# Patient Record
Sex: Female | Born: 1935 | Race: White | Hispanic: No | Smoking: Never smoker
Health system: Southern US, Community
[De-identification: ages and names within clinical notes are randomized; demographics above are authoritative.]

## PROBLEM LIST (undated history)

## (undated) DIAGNOSIS — K559 Vascular disorder of intestine, unspecified: Secondary | ICD-10-CM

## (undated) DIAGNOSIS — M1990 Unspecified osteoarthritis, unspecified site: Secondary | ICD-10-CM

## (undated) DIAGNOSIS — N390 Urinary tract infection, site not specified: Secondary | ICD-10-CM

## (undated) DIAGNOSIS — I499 Cardiac arrhythmia, unspecified: Secondary | ICD-10-CM

## (undated) DIAGNOSIS — I441 Atrioventricular block, second degree: Secondary | ICD-10-CM

## (undated) DIAGNOSIS — I442 Atrioventricular block, complete: Secondary | ICD-10-CM

## (undated) DIAGNOSIS — I4891 Unspecified atrial fibrillation: Secondary | ICD-10-CM

## (undated) DIAGNOSIS — Z950 Presence of cardiac pacemaker: Secondary | ICD-10-CM

## (undated) DIAGNOSIS — I10 Essential (primary) hypertension: Secondary | ICD-10-CM

## (undated) DIAGNOSIS — E785 Hyperlipidemia, unspecified: Secondary | ICD-10-CM

## (undated) DIAGNOSIS — D649 Anemia, unspecified: Secondary | ICD-10-CM

## (undated) DIAGNOSIS — R55 Syncope and collapse: Secondary | ICD-10-CM

## (undated) DIAGNOSIS — D696 Thrombocytopenia, unspecified: Secondary | ICD-10-CM

## (undated) DIAGNOSIS — I471 Supraventricular tachycardia: Secondary | ICD-10-CM

## (undated) DIAGNOSIS — J069 Acute upper respiratory infection, unspecified: Secondary | ICD-10-CM

## (undated) DIAGNOSIS — I639 Cerebral infarction, unspecified: Secondary | ICD-10-CM

## (undated) HISTORY — DX: Acute upper respiratory infection, unspecified: J06.9

## (undated) HISTORY — DX: Unspecified osteoarthritis, unspecified site: M19.90

## (undated) HISTORY — PX: CHOLECYSTECTOMY: SHX55

## (undated) HISTORY — PX: OTHER SURGICAL HISTORY: SHX169

## (undated) HISTORY — DX: Cardiac arrhythmia, unspecified: I49.9

## (undated) HISTORY — DX: Urinary tract infection, site not specified: N39.0

## (undated) HISTORY — DX: Supraventricular tachycardia: I47.1

## (undated) HISTORY — PX: PACEMAKER PLACEMENT: SHX43

## (undated) HISTORY — DX: Atrioventricular block, complete: I44.2

## (undated) HISTORY — DX: Unspecified atrial fibrillation: I48.91

## (undated) HISTORY — DX: Cerebral infarction, unspecified: I63.9

## (undated) HISTORY — DX: Essential (primary) hypertension: I10

---

## 2012-06-22 DIAGNOSIS — I471 Supraventricular tachycardia, unspecified: Secondary | ICD-10-CM

## 2012-06-22 DIAGNOSIS — I442 Atrioventricular block, complete: Secondary | ICD-10-CM | POA: Insufficient documentation

## 2012-06-22 HISTORY — DX: Atrioventricular block, complete: I44.2

## 2012-06-22 HISTORY — DX: Supraventricular tachycardia: I47.1

## 2012-06-22 HISTORY — DX: Supraventricular tachycardia, unspecified: I47.10

## 2013-01-01 ENCOUNTER — Ambulatory Visit: Payer: Self-pay | Admitting: Internal Medicine

## 2013-01-13 ENCOUNTER — Inpatient Hospital Stay: Payer: Self-pay | Admitting: Student

## 2013-01-13 LAB — CBC WITH DIFFERENTIAL/PLATELET
Basophil #: 0.1 10*3/uL (ref 0.0–0.1)
Eosinophil #: 0 10*3/uL (ref 0.0–0.7)
HCT: 37.2 % (ref 35.0–47.0)
Lymphocyte %: 4.3 %
Lymphocytes: 7 %
MCV: 91 fL (ref 80–100)
Monocyte %: 15.9 %
RBC: 4.1 10*6/uL (ref 3.80–5.20)
RDW: 13.5 % (ref 11.5–14.5)
Segmented Neutrophils: 79 %
WBC: 25.6 10*3/uL — ABNORMAL HIGH (ref 3.6–11.0)

## 2013-01-13 LAB — COMPREHENSIVE METABOLIC PANEL
Albumin: 3.3 g/dL — ABNORMAL LOW (ref 3.4–5.0)
BUN: 11 mg/dL (ref 7–18)
Bilirubin,Total: 1 mg/dL (ref 0.2–1.0)
Calcium, Total: 9.2 mg/dL (ref 8.5–10.1)
Chloride: 96 mmol/L — ABNORMAL LOW (ref 98–107)
Creatinine: 0.74 mg/dL (ref 0.60–1.30)
EGFR (African American): >60
Glucose: 136 mg/dL — ABNORMAL HIGH (ref 65–99)
SGPT (ALT): 19 U/L (ref 12–78)
Total Protein: 7.4 g/dL (ref 6.4–8.2)

## 2013-01-13 LAB — URINALYSIS, COMPLETE
Glucose,UR: NEGATIVE mg/dL (ref 0–75)
Nitrite: POSITIVE
Ph: 6 (ref 4.5–8.0)
Protein: NEGATIVE
Specific Gravity: 1.005 (ref 1.003–1.030)
WBC UR: 20 /HPF (ref 0–5)

## 2013-01-13 LAB — APTT: Activated PTT: 34 secs (ref 23.6–35.9)

## 2013-01-13 LAB — PROTIME-INR: INR: 1.1

## 2013-01-13 LAB — LIPASE, BLOOD: Lipase: 115 U/L (ref 73–393)

## 2013-01-14 LAB — CBC WITH DIFFERENTIAL/PLATELET
Basophil #: 0 10*3/uL (ref 0.0–0.1)
Basophil %: 0.2 %
Eosinophil #: 0 10*3/uL (ref 0.0–0.7)
Eosinophil %: 0.1 %
HGB: 9.9 g/dL — ABNORMAL LOW (ref 12.0–16.0)
Lymphocyte #: 1.6 10*3/uL (ref 1.0–3.6)
Lymphocyte %: 8.5 %
MCV: 90 fL (ref 80–100)
Monocyte #: 4.6 x10 3/mm — ABNORMAL HIGH (ref 0.2–0.9)
Monocyte %: 24.1 %
Neutrophil %: 67.1 %
Platelet: 38 10*3/uL — ABNORMAL LOW (ref 150–440)
RBC: 3.2 10*6/uL — ABNORMAL LOW (ref 3.80–5.20)

## 2013-01-14 LAB — BASIC METABOLIC PANEL
BUN: 7 mg/dL (ref 7–18)
Calcium, Total: 8.1 mg/dL — ABNORMAL LOW (ref 8.5–10.1)
Co2: 22 mmol/L (ref 21–32)
Creatinine: 0.73 mg/dL (ref 0.60–1.30)
EGFR (African American): >60
EGFR (Non-African Amer.): >60
Glucose: 120 mg/dL — ABNORMAL HIGH (ref 65–99)
Osmolality: 264 (ref 275–301)
Potassium: 3.6 mmol/L (ref 3.5–5.1)
Sodium: 132 mmol/L — ABNORMAL LOW (ref 136–145)

## 2013-01-14 LAB — LIPID PANEL
Ldl Cholesterol, Calc: 56 mg/dL (ref 0–100)
VLDL Cholesterol, Calc: 7 mg/dL (ref 5–40)

## 2013-01-14 LAB — URINE CULTURE

## 2013-01-15 LAB — CBC WITH DIFFERENTIAL/PLATELET
Basophil #: 0 10*3/uL (ref 0.0–0.1)
Eosinophil #: 0 10*3/uL (ref 0.0–0.7)
Eosinophil %: 0.1 %
Lymphocyte %: 6.5 %
MCH: 31.2 pg (ref 26.0–34.0)
MCHC: 35 g/dL (ref 32.0–36.0)
MCV: 89 fL (ref 80–100)
Monocyte #: 4.5 x10 3/mm — ABNORMAL HIGH (ref 0.2–0.9)
Monocyte %: 22.8 %
Neutrophil #: 13.7 10*3/uL — ABNORMAL HIGH (ref 1.4–6.5)
RDW: 13.6 % (ref 11.5–14.5)
WBC: 19.5 10*3/uL — ABNORMAL HIGH (ref 3.6–11.0)

## 2013-01-15 LAB — FIBRIN DEGRADATION PROD.(ARMC ONLY): Fibrin Degradation Prod.: >10 ug/ml — ABNORMAL HIGH (ref 2.1–7.7)

## 2013-01-15 LAB — BASIC METABOLIC PANEL
Anion Gap: 7 (ref 7–16)
Calcium, Total: 8 mg/dL — ABNORMAL LOW (ref 8.5–10.1)
Creatinine: 0.63 mg/dL (ref 0.60–1.30)
EGFR (African American): >60
Glucose: 118 mg/dL — ABNORMAL HIGH (ref 65–99)

## 2013-01-15 LAB — LACTATE DEHYDROGENASE: LDH: 140 U/L (ref 81–246)

## 2013-01-16 LAB — CBC WITH DIFFERENTIAL/PLATELET
Basophil #: 0 10*3/uL (ref 0.0–0.1)
Basophil %: 0.1 %
HCT: 30 % — ABNORMAL LOW (ref 35.0–47.0)
Lymphocyte #: 1.2 10*3/uL (ref 1.0–3.6)
Lymphocyte %: 7.8 %
MCH: 30.8 pg (ref 26.0–34.0)
MCHC: 34.8 g/dL (ref 32.0–36.0)
Neutrophil #: 10.6 10*3/uL — ABNORMAL HIGH (ref 1.4–6.5)
Neutrophil %: 71.9 %
RDW: 13.7 % (ref 11.5–14.5)

## 2013-01-16 LAB — BASIC METABOLIC PANEL
Anion Gap: 6 — ABNORMAL LOW (ref 7–16)
Calcium, Total: 7.8 mg/dL — ABNORMAL LOW (ref 8.5–10.1)
Chloride: 102 mmol/L (ref 98–107)
Creatinine: 0.55 mg/dL — ABNORMAL LOW (ref 0.60–1.30)
EGFR (African American): >60
Glucose: 121 mg/dL — ABNORMAL HIGH (ref 65–99)
Potassium: 3.3 mmol/L — ABNORMAL LOW (ref 3.5–5.1)
Sodium: 132 mmol/L — ABNORMAL LOW (ref 136–145)

## 2013-01-16 LAB — URINE CULTURE

## 2013-01-17 LAB — CBC WITH DIFFERENTIAL/PLATELET
Basophil %: 0.2 %
Eosinophil #: 0.1 10*3/uL (ref 0.0–0.7)
HCT: 28.3 % — ABNORMAL LOW (ref 35.0–47.0)
Lymphocyte #: 1.6 10*3/uL (ref 1.0–3.6)
MCH: 31.4 pg (ref 26.0–34.0)
MCHC: 35.4 g/dL (ref 32.0–36.0)
MCV: 89 fL (ref 80–100)
Monocyte #: 2 x10 3/mm — ABNORMAL HIGH (ref 0.2–0.9)
Monocyte %: 23.6 %
Neutrophil %: 56.9 %
RBC: 3.19 10*6/uL — ABNORMAL LOW (ref 3.80–5.20)
RDW: 13.8 % (ref 11.5–14.5)

## 2013-01-17 LAB — OCCULT BLOOD X 1 CARD TO LAB, STOOL: Occult Blood, Feces: NEGATIVE

## 2013-01-18 LAB — CBC WITH DIFFERENTIAL/PLATELET
Basophil #: 0 10*3/uL (ref 0.0–0.1)
Basophil %: 0.2 %
Eosinophil %: 0.8 %
HGB: 10.2 g/dL — ABNORMAL LOW (ref 12.0–16.0)
Lymphocyte #: 1.6 10*3/uL (ref 1.0–3.6)
Lymphocyte %: 18.2 %
MCH: 31.3 pg (ref 26.0–34.0)
MCHC: 34.9 g/dL (ref 32.0–36.0)
MCV: 90 fL (ref 80–100)
Monocyte #: 2.1 x10 3/mm — ABNORMAL HIGH (ref 0.2–0.9)
Monocyte %: 24.1 %
Neutrophil #: 4.9 10*3/uL (ref 1.4–6.5)
RBC: 3.27 10*6/uL — ABNORMAL LOW (ref 3.80–5.20)
RDW: 13.7 % (ref 11.5–14.5)

## 2013-01-19 LAB — CBC WITH DIFFERENTIAL/PLATELET
Basophil #: 0 10*3/uL (ref 0.0–0.1)
Eosinophil #: 0.1 10*3/uL (ref 0.0–0.7)
Eosinophil %: 0.8 %
HGB: 10.8 g/dL — ABNORMAL LOW (ref 12.0–16.0)
Lymphocyte #: 1.1 10*3/uL (ref 1.0–3.6)
MCH: 30.6 pg (ref 26.0–34.0)
MCHC: 34.5 g/dL (ref 32.0–36.0)
MCV: 89 fL (ref 80–100)
Monocyte #: 2.2 x10 3/mm — ABNORMAL HIGH (ref 0.2–0.9)
Monocyte %: 25 %
Neutrophil #: 5.4 10*3/uL (ref 1.4–6.5)
Platelet: 63 10*3/uL — ABNORMAL LOW (ref 150–440)
RDW: 13.7 % (ref 11.5–14.5)
WBC: 8.8 10*3/uL (ref 3.6–11.0)

## 2013-01-19 LAB — CLOSTRIDIUM DIFFICILE BY PCR

## 2013-01-20 LAB — STOOL CULTURE

## 2013-01-22 ENCOUNTER — Ambulatory Visit: Payer: Self-pay | Admitting: Hematology and Oncology

## 2013-01-22 LAB — CBC CANCER CENTER
Basophil %: 0.5 %
Eosinophil #: 0 x10 3/mm (ref 0.0–0.7)
Eosinophil %: 0.3 %
HCT: 33.1 % — ABNORMAL LOW (ref 35.0–47.0)
HGB: 11.4 g/dL — ABNORMAL LOW (ref 12.0–16.0)
Lymphocyte #: 1.4 x10 3/mm (ref 1.0–3.6)
Lymphocyte %: 15.9 %
MCHC: 34.4 g/dL (ref 32.0–36.0)
MCV: 91 fL (ref 80–100)
Monocyte #: 2.4 x10 3/mm — ABNORMAL HIGH (ref 0.2–0.9)
Monocyte %: 26.9 %
Platelet: 136 x10 3/mm — ABNORMAL LOW (ref 150–440)
RBC: 3.65 10*6/uL — ABNORMAL LOW (ref 3.80–5.20)
WBC: 9 x10 3/mm (ref 3.6–11.0)

## 2013-01-28 ENCOUNTER — Encounter: Payer: Self-pay | Admitting: Internal Medicine

## 2013-02-01 ENCOUNTER — Encounter: Payer: Self-pay | Admitting: Internal Medicine

## 2013-02-01 ENCOUNTER — Ambulatory Visit: Payer: Self-pay | Admitting: Hematology and Oncology

## 2013-02-01 ENCOUNTER — Ambulatory Visit: Payer: Self-pay | Admitting: Internal Medicine

## 2013-06-17 DIAGNOSIS — I442 Atrioventricular block, complete: Secondary | ICD-10-CM | POA: Diagnosis not present

## 2013-06-17 DIAGNOSIS — Z950 Presence of cardiac pacemaker: Secondary | ICD-10-CM | POA: Diagnosis not present

## 2013-06-22 DIAGNOSIS — E7800 Pure hypercholesterolemia, unspecified: Secondary | ICD-10-CM | POA: Diagnosis not present

## 2013-06-22 DIAGNOSIS — M25559 Pain in unspecified hip: Secondary | ICD-10-CM | POA: Diagnosis not present

## 2013-06-22 DIAGNOSIS — I119 Hypertensive heart disease without heart failure: Secondary | ICD-10-CM | POA: Diagnosis not present

## 2013-07-14 DIAGNOSIS — I498 Other specified cardiac arrhythmias: Secondary | ICD-10-CM | POA: Diagnosis not present

## 2013-07-14 DIAGNOSIS — I442 Atrioventricular block, complete: Secondary | ICD-10-CM | POA: Diagnosis not present

## 2013-07-14 DIAGNOSIS — I10 Essential (primary) hypertension: Secondary | ICD-10-CM | POA: Diagnosis not present

## 2013-07-14 DIAGNOSIS — E785 Hyperlipidemia, unspecified: Secondary | ICD-10-CM | POA: Diagnosis not present

## 2013-08-11 DIAGNOSIS — H353190 Nonexudative age-related macular degeneration, unspecified eye, stage unspecified: Secondary | ICD-10-CM | POA: Diagnosis not present

## 2013-08-11 DIAGNOSIS — Z961 Presence of intraocular lens: Secondary | ICD-10-CM | POA: Diagnosis not present

## 2013-08-11 DIAGNOSIS — H526 Other disorders of refraction: Secondary | ICD-10-CM | POA: Diagnosis not present

## 2013-08-11 DIAGNOSIS — H40019 Open angle with borderline findings, low risk, unspecified eye: Secondary | ICD-10-CM | POA: Diagnosis not present

## 2013-09-23 DIAGNOSIS — I4430 Unspecified atrioventricular block: Secondary | ICD-10-CM | POA: Diagnosis not present

## 2013-12-02 DIAGNOSIS — I4949 Other premature depolarization: Secondary | ICD-10-CM | POA: Diagnosis not present

## 2013-12-02 DIAGNOSIS — N390 Urinary tract infection, site not specified: Secondary | ICD-10-CM | POA: Diagnosis not present

## 2013-12-02 DIAGNOSIS — R112 Nausea with vomiting, unspecified: Secondary | ICD-10-CM | POA: Diagnosis not present

## 2013-12-02 DIAGNOSIS — S22009A Unspecified fracture of unspecified thoracic vertebra, initial encounter for closed fracture: Secondary | ICD-10-CM | POA: Diagnosis not present

## 2013-12-02 DIAGNOSIS — I10 Essential (primary) hypertension: Secondary | ICD-10-CM | POA: Diagnosis not present

## 2013-12-02 DIAGNOSIS — X58XXXA Exposure to other specified factors, initial encounter: Secondary | ICD-10-CM | POA: Diagnosis not present

## 2013-12-02 DIAGNOSIS — R9431 Abnormal electrocardiogram [ECG] [EKG]: Secondary | ICD-10-CM | POA: Diagnosis not present

## 2013-12-02 DIAGNOSIS — R109 Unspecified abdominal pain: Secondary | ICD-10-CM | POA: Diagnosis not present

## 2013-12-02 DIAGNOSIS — Z79899 Other long term (current) drug therapy: Secondary | ICD-10-CM | POA: Diagnosis not present

## 2013-12-02 DIAGNOSIS — M8448XA Pathological fracture, other site, initial encounter for fracture: Secondary | ICD-10-CM | POA: Diagnosis not present

## 2013-12-02 DIAGNOSIS — Z7982 Long term (current) use of aspirin: Secondary | ICD-10-CM | POA: Diagnosis not present

## 2013-12-02 DIAGNOSIS — R197 Diarrhea, unspecified: Secondary | ICD-10-CM | POA: Diagnosis not present

## 2013-12-02 DIAGNOSIS — Z5181 Encounter for therapeutic drug level monitoring: Secondary | ICD-10-CM | POA: Diagnosis not present

## 2013-12-02 DIAGNOSIS — R52 Pain, unspecified: Secondary | ICD-10-CM | POA: Diagnosis not present

## 2013-12-02 DIAGNOSIS — M25559 Pain in unspecified hip: Secondary | ICD-10-CM | POA: Diagnosis not present

## 2014-01-05 DIAGNOSIS — I442 Atrioventricular block, complete: Secondary | ICD-10-CM | POA: Diagnosis not present

## 2014-01-11 DIAGNOSIS — M25559 Pain in unspecified hip: Secondary | ICD-10-CM | POA: Diagnosis not present

## 2014-01-11 DIAGNOSIS — I119 Hypertensive heart disease without heart failure: Secondary | ICD-10-CM | POA: Diagnosis not present

## 2014-01-11 DIAGNOSIS — E7800 Pure hypercholesterolemia, unspecified: Secondary | ICD-10-CM | POA: Diagnosis not present

## 2014-02-24 DIAGNOSIS — Z23 Encounter for immunization: Secondary | ICD-10-CM | POA: Diagnosis not present

## 2014-03-03 DIAGNOSIS — Z961 Presence of intraocular lens: Secondary | ICD-10-CM | POA: Diagnosis not present

## 2014-03-03 DIAGNOSIS — H3531 Nonexudative age-related macular degeneration: Secondary | ICD-10-CM | POA: Diagnosis not present

## 2014-03-03 DIAGNOSIS — H40019 Open angle with borderline findings, low risk, unspecified eye: Secondary | ICD-10-CM | POA: Diagnosis not present

## 2014-03-03 DIAGNOSIS — H527 Unspecified disorder of refraction: Secondary | ICD-10-CM | POA: Diagnosis not present

## 2014-04-20 DIAGNOSIS — I442 Atrioventricular block, complete: Secondary | ICD-10-CM | POA: Diagnosis not present

## 2014-04-29 DIAGNOSIS — J209 Acute bronchitis, unspecified: Secondary | ICD-10-CM | POA: Diagnosis not present

## 2014-06-27 DIAGNOSIS — M25559 Pain in unspecified hip: Secondary | ICD-10-CM | POA: Diagnosis not present

## 2014-06-27 DIAGNOSIS — E780 Pure hypercholesterolemia: Secondary | ICD-10-CM | POA: Diagnosis not present

## 2014-06-27 DIAGNOSIS — I119 Hypertensive heart disease without heart failure: Secondary | ICD-10-CM | POA: Diagnosis not present

## 2014-07-07 ENCOUNTER — Encounter: Payer: Self-pay | Admitting: Hematology and Oncology

## 2014-07-14 DIAGNOSIS — E780 Pure hypercholesterolemia: Secondary | ICD-10-CM | POA: Diagnosis not present

## 2014-07-14 DIAGNOSIS — I442 Atrioventricular block, complete: Secondary | ICD-10-CM | POA: Diagnosis not present

## 2014-07-14 DIAGNOSIS — Z950 Presence of cardiac pacemaker: Secondary | ICD-10-CM | POA: Diagnosis not present

## 2014-07-14 DIAGNOSIS — I10 Essential (primary) hypertension: Secondary | ICD-10-CM | POA: Diagnosis not present

## 2014-08-09 DIAGNOSIS — Z950 Presence of cardiac pacemaker: Secondary | ICD-10-CM | POA: Diagnosis not present

## 2014-09-15 DIAGNOSIS — H40019 Open angle with borderline findings, low risk, unspecified eye: Secondary | ICD-10-CM | POA: Diagnosis not present

## 2014-09-15 DIAGNOSIS — H3531 Nonexudative age-related macular degeneration: Secondary | ICD-10-CM | POA: Diagnosis not present

## 2014-09-15 DIAGNOSIS — Z961 Presence of intraocular lens: Secondary | ICD-10-CM | POA: Diagnosis not present

## 2014-09-23 NOTE — Consult Note (Signed)
PATIENT NAMEAdama, Ivins Jalayne MR#:  [id number] DATE OF BIRTH:  [birth year]  DATE OF CONSULTATION:  01/14/2013  REQUESTING PHYSICIAN:  Tana Conch. Leslye Peer, M.D   CONSULTING PHYSICIAN:  Cheral Marker. Ola Spurr, MD  REASON FOR CONSULTATION: Diarrhea, colitis and thrombocytopenia.   HISTORY OF PRESENT ILLNESS: [name] who has a history of hypertension, macular degeneration, sick sinus syndrome with a pacemaker, who fell one week ago and had right hip pain. She went to [facility] and was found to have no fracture, but was given Vicodin. She then became constipated and was given a laxative. The following day, she then developed urinary frequency and saw a PMD who put her on Bactrim for a urinary tract infection. She then had massive diarrhea and felt like she was going to pass out, and was having pain in her hips and her abdomen. She came to the ER. She was found to be febrile,  had an elevated white count and thrombocytopenia as well as hyponatremia. She was admitted for further evaluation. She was initially started on Cipro and Flagyl. She has not had further bowel movements, and no stool studies are pending. Her platelet count has trended down since admission. We were consulted for further evaluation.   PAST MEDICAL HISTORY: 1.  Hypertension.  2.  Macular degeneration.  3.  Sick sinus syndrome.  4.  Chronic pain.   PAST SURGICAL HISTORY:  1.  Pacemaker.  2.  Cholecystectomy.  3.  Left hip replacement.  4.  Cataracts.  5.  Appendectomy.   FAMILY HISTORY: Mother with CVA. Father with alcohol abuse.   SOCIAL HISTORY: The patient lives with her daughter now. She does not smoke, drinks occasional wine. No drug use. She worked in [employer], educational.   REVIEW OF SYSTEMS: Eleven systems are negative, except as per HPI.   MEDICATIONS: As an outpatient, included hydralazine, aspirin, fish oil, Norvasc, losartan, lidocaine patch, Coreg and also was recently on Bactrim and Norco. Since  admission, she has been started on IV Cipro and Flagyl.   ALLERGIES: THE PATIENT IS ALLERGIC TO VICODIN, PENICILLIN, DILAUDID AND LIDODERM PATCH.   PHYSICAL EXAMINATION: VITALS: T-max since admission is 102.3. Over the last 24 hours, her T-max is 100.8, pulse is 90, blood pressure elevated at 179/74, sats 95% on room air.  GENERAL: She is thin, pleasant, lying quietly in bed, somewhat anxious appearing, in no respiratory distress.  HEENT: Pupils equal, round and react to light and accommodation. There is no icterus, no pallor. Oropharynx is clear with no lesions or thrush.  NECK: Supple.  HEART: Regular.  LUNGS: Clear to auscultation bilaterally.  ABDOMEN: Soft, mildly distended, mildly tender to palpation diffusely. No rebound or guarding.  EXTREMITIES: There is no clubbing, cyanosis or edema.  NEUROLOGIC: She is alert, interactive x3.   DATA: White blood count on admission elevated at 25.6 with a recent white count of 20.4. Hemoglobin was 12.8, platelets 46.  Today blood count is down to 19.5, hemoglobin 9.8, platelets 18. INR was 1.1, PTT was normal. Fibrin split products were greater than 10. Urinalysis reveals 20 white cells, positive nitrites, and 3+ leukocyte esterase. Urinalysis initially was mixed bacteria and a repeat one has colonies growing that are too small to read. Hepatitis C viral antibody is negative. Blood cultures did not seem to be done.   IMAGING:  CT scan of the abdomen shows small bilateral pleural effusions, mild atelectasis versus infiltrate within the lung bases. Multiple low attenuating foci within the liver,  likely cysts. There are multiple calcified densities within the spleen, likely the sequela of prior granulomatous disease. Gallbladder is absent. There are areas of colonic wall thickening with surrounding inflammatory changes in the pericolic fat within the descending and transverse colon. There is no evidence of bowel obstruction or free fluid. The osseous  structures demonstrate paraspinous inflammatory changes at T1 and T2. A nuclear whole body scan was done which showed degenerative changes of the lumbar spine.   IMPRESSION: Elderly female with recent fall who also was treated with Bactrim for a urinary tract infection, who presented with acute onset of abdominal pain and diarrhea and fever, and now has progressive thrombocytopenia. Her diarrhea has resolved, but she was passing some flatus. CT scan does show some colitis.   I am concerned for colitis, especially Clostridium difficile colitis with the elevated white count and the findings on CAT scan. She has not had a bowel movement, so cultures are pending. Other bacterial or viral causes of colitis are also possible. Ischemic colitis is also a very real possibility.    RECOMMENDATIONS: 1.  Continue IV Cipro and Flagyl.  2.  Vancomycin 250 q.i.d. orally for Clostridium difficile colitis.  3.  Send stool studies once stool is produced.  4.  Consider GI consult if she worsens.   Thank you very much for the consult. I will be available over the weekend to follow her via phone if needed. I will see her again Monday.   ____________________________ Cheral Marker. Ola Spurr, MD dpf:nts D: 01/15/2013 21:07:01 ET T: 01/16/2013 00:21:16 ET JOB#: [id number]  cc: Cheral Marker. Ola Spurr, MD, <Dictator> Keerthana Vanrossum Ola Spurr MD ELECTRONICALLY SIGNED 01/17/2013 21:35

## 2014-09-23 NOTE — Consult Note (Signed)
Brief Consult Note: Diagnosis: back and hip pain.   Patient was seen by consultant.   Recommend further assessment or treatment.   Orders entered.   Comments: rec bone scan, not sure if pain is lumbar or pelvis/hip.  Added Nucynta 50 for pain.  Electronic Signatures: Laurene Footman (MD)  (Signed 13-Aug-14 15:49)  Authored: Brief Consult Note   Last Updated: 13-Aug-14 15:49 by Laurene Footman (MD)

## 2014-09-23 NOTE — Consult Note (Signed)
History of Present Illness:  Reason for Consult low platelet count   HPI   [name] who had a fall last Thursday,  did not tell anyone, had right hip pain. She went to [facility] and was found to have no fracture. She then started staying with her daughter.  They took her to the PMD who put her on Bactrim for urinary tract infection. On 01/13/13. she had massive diarrhea, felt like she was going to pass out,  was shaking, was breathing hard, and complained of pain in the hips and some soreness in the abdomen.  She came to the ER at [facility] for further evaluation and found to have a high white count of 25.6, a low platelet count of 46 and a sodium of 128. She was tachycardic and had a low-grade temperature.scan reveals colitis.  PFSH:  Family History positive, Colon ca   Social History negative tobacco   Review of Systems:  General weakness   Performance Status (ECOG) 2   HEENT no complaints   Lungs no complaints   Cardiac no complaints   GI diarrhea   GU urinary frequency   Musculoskeletal back pain   Extremities no complaints   Skin no complaints   Neuro no complaints   Psych no complaints   NURSING NOTES: ED Vital Sign Flow Sheet:   13-Aug-14 09:20   Pulse Pulse: 95   Respirations Respirations: 22   SBP SBP: 150   DBP DBP: 72   Pulse Ox % Pulse Ox %: 95   Pulse Ox Source Source: Room Air   Pain Scale (0-10) Pain Scale (0-10): Scale:2   Physical Exam:  General elderly female inno acute distress   HEENT: normal   Lungs: clear   Cardiac: regular rate, rhythm   Breast: not examined   Abdomen: soft  tender   Skin: intact   Extremities: No edema, rash or cyanosis   Neuro: AAOx3    Vicodin: Rash  Penicillin: Unknown  Dilaudid: Unknown  Lidocaine: Unknown  Radiology Results: CT:    13-Aug-14 06:17, CT Abdomen and Pelvis With Contrast  CT Abdomen and Pelvis With Contrast   REASON FOR EXAM:    (1) generalized  abdominal pain, diarrhea WBC 26; (2)   generalized abdominal pain,  COMMENTS:       PROCEDURE: CT  - CT ABDOMEN / PELVIS  W  - Jan 13 2013  6:17AM     RESULT:     Technique: Helical 3 mm sections were obtained fromthe lung bases   through the pubic symphysis status post intravenous administration of 100   ml of Isovue-300.    Findings:     Small, bilateral effusions are identified. Mild atelectasis versus     infiltrates within the lung bases.     Multiple, low attenuating foci are identified within the liver and likely   representing cysts.    The kidneys, adrenals and pancreas are unremarkable. Multiple calcified   densities are identified within the spleen and likely reflecting the   sequela of prior granulomatous disease. The patient is status post   cholecystectomy. Areas of colonic wall thickening are identified with   surrounding inflammatory change in the pericolonic fat within the   descending and transverse colon. There is no evidence of bowel   obstruction or evidence of abdominal or pelvic free fluid, loculated   fluid collections, masses, or adenopathy.    The osseous structures demonstrate paraspinous inflammatory changes at  the T11-T12 level. This is appreciated on images 25 and 26 of the soft   tissue and lung series.    IMPRESSION:     1. Areas of colonic wall thickening and surrounding pericolonic   inflammatory changes of the descending and transverse colon. There is no   evidence of obstruction. The findings are likely secondary to infectious   or inflammatory etiologies.  2. Inflammatory changes at the T11-12 level. This has the appearance of   Kummel disease/osteonecrosis. There does not appear to be definitive   evidence of osteomyelitis. There are findings which likely represent a   vacuum disc at the T11-T12 level. Clinical correlation is recommended.  3.  Bladder wall is mildly thickened and may be secondary to chronic   obstruction versus  cystitis.   4.  Trace bilateral effusions.  5.  Dr. Owens Shark of the Emergency Department was informed of these findings   via a preliminary faxed report.    Thank you for this opportunity to contribute to the care of your patient.         Verified By: Mikki Santee, M.D., MD   Assessment and Plan: Impression:   Patient with colitis and sepsis  recently treated with Bactrim for UTI Plan:   Low platelet count likely secondary to sepsis and inflammatory process with colitis and fever. Bucks trending down on antibiotics. Cultures pending.count could also be seconadry to recent therapy with Bactrim.agree with continued therapy for infection and expect platelet count to recover.evidence of bleeding at present. Would transfuse platelets if active bleeding or platelt count < 10 000.  Electronic Signatures: Georges Mouse (MD)  (Signed 14-Aug-14 16:44)  Authored: HISTORY OF PRESENT ILLNESS, PFSH, ROS, NURSING NOTES, PE, ALLERGIES, OTHER RESULTS, ASSESSMENT AND PLAN   Last Updated: 14-Aug-14 16:44 by Georges Mouse (MD)

## 2014-09-23 NOTE — Consult Note (Signed)
HEMATOLOGY followup - patient is sitting in chair, states weakness slowly improving  Denies bleeding symptoms.denies fevers.  Eating better.alert and oriented, NAD.           vitals - 98.4, 83, 20, 163/74, 94% on room air           Lungs - bilateral good air entry           Abdomen - soft, nontenderhemoglobin 10, WBC 8600, ANC 4900, platelets 30K. Haptoglobin and LDH unremarkable.  FDP 10-40.  HCV antibody negative. year old female with colitis,SIRS, along with significant thrombocytopenia most likely secondary to ongoing infection/sepsis with possible low-grade DIC given elevated FDPs. Otherwise recent serum LDH, haptoglobin, creatinine, HCV antibody all unremarkable. No bleeding symptoms, platelet count is improving and is 30K today.  No new recommendations.  Continue to monitor CBC and platelets daily, monitor for bleeding symptoms.  Recommend platelet transfusion if active bleeding or platelt count< 10K.  If thrombocytopenia does not improve after acute issues are resolved, she may need further workup in the future. Hematology will continue to follow intermittently.  Electronic Signatures: Jonn Shingles (MD)  (Signed on 17-Aug-14 20:28)  Authored  Last Updated: 17-Aug-14 20:28 by Jonn Shingles (MD)

## 2014-09-23 NOTE — Discharge Summary (Signed)
PATIENT NAMEArelie, [name] MR#:  [id number] DATE OF BIRTH:  02/15/1923  DATE OF ADMISSION:  01/13/2013 DATE OF DISCHARGE:  01/19/2013  CONSULTANTS: Dr. Kallie Edward and Dr. Ma Hillock from the cancer center, Dr. Rudene Christians from orthopedics, Dr. Ola Spurr from infectious disease.   PRIMARY CARE PHYSICIAN: at [facility]: Diarrhea.   DISCHARGE DIAGNOSES:  1.  Systemic inflammatory response syndrome on arrival, likely secondary to colitis.  2.  Thrombocytopenia, possibly low-grade disseminated intravascular coagulation.  3.  Right hip pain.  4.  Hyponatremia.  5.  Hypokalemia.  6.  Hypertension.  7.  History of macular degeneration.  8.  History of sick sinus syndrome, status post pacemaker.  9.  Chronic pain.   DISCHARGE MEDICATIONS: Hydralazine 10 mg three times a day, aspirin 81 mg daily, omega-3, 1000 mg fatty acids 2 tabs once a day, amlodipine 5 mg daily, losartan 50 mg two times a day, lidocaine topical 5% apply to affected area as needed for pain, carvedilol 12.5 mg two times a day, Norco 325/5 mg every six hours as needed for pain, PreserVision 2 tabs once a day, metronidazole 500 mg one tab every 8 hours for nine days.   DISPOSITION: Discharged with home health, PT and R.N.   DIET: Low sodium.   ACTIVITY: As tolerated.   DISCHARGE INSTRUCTIONS: Please follow with PCP within 1 to 2 weeks. Please follow at the cancer center on Friday for a CBC check and follow with the orthopedic doctor within 1 to 2 weeks.   DISPOSITION: Home.   CODE STATUS: FULL CODE.   SIGNIFICANT LABORATORIES AND IMAGING:  1.  Initial sodium 128. Last sodium 132. Initial potassium 4.3. Initial LFTs showed albumin of 3.3; otherwise, within normal limits. Initial WBC was 25.6. By discharge it had normalize. Initial hemoglobin 12.8, last hemoglobin 10.8. Initial platelets was 46. Lowest platelet was 17 on August 16 and today the platelet was 63. Initial INR 1.1, PT 14, and the fibrin degradation products  greater than 10 but less than 40 elevated.   2.  Urine cultures on arrival shows mixed organisms. Repeated on August 14 again showed mixed organisms. Stool cultures on August 17: WBC and C. diff all negative. Initial UA showed  3+ leukocyte esterase, positive for nitrites, and 20 WBC. Trace bacteria. Hepatitis C viral antibody is negative. Haptoglobin was high at 247 mg/dL. Occult blood on August 17 negative.   3.  CT of abdomen and pelvis with contrast on August 13 showed area of colonic wall thickening and surrounding pericolic inflammatory changes of the descending and transverse colon. No evidence of obstruction, likely secondary to infectious or inflammatory etiology. There are  inflammatory changes at the T11-T12 level. 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 There does not appear to be definitive evidence of osteomyelitis. Bladder wall was mildly thickened. Trace bilateral effusions.   4.  Bone scan on August 14 showed there were degenerative changes of the lumbar spine. There is hip arthroplasty without significant radiotracer uptake to suggest loosening or infection.   HISTORY OF PRESENT ILLNESS AND HOSPITAL COURSE: For full details of H and P, please see the dictation on August 13 by Dr. Leslye Peer, but briefly this is a pleasant 81 year old female who had a fall Thursday prior to admission, did not tell anyone, and had a hip pain. She  went to [facility] and was found to have no fractures. She was staying with her daughter and did have increased polyuria and went to her PCP and  was placed on Bactrim for a UTI. She had large diarrhea, was presyncopal and came into the hospital, also complaining of hip pain as well as soreness in the abdomen.   She was found to have a WBC count of 25,000, a low platelet count of 46, and sodium of 128, tachycardic, and therefore admitted to the hospitalist service for systemic inflammatory response syndrome, hyponatremia, thrombocytopenia, and  possible colitis.   She was started on empiric Cipro, Flagyl, IV fluids, morphine and normal saline for hyponatremia. It was not clear if the thrombocytopenia was infection-related versus Bactrim.   She was also seen by orthopedics while she was in the hospital. She did have a positive UA and was on Bactrim, but the urine cultures came back mixed flora.   In regard to the systemic inflammatory response syndrome, this was possibly secondary to colitis. It was seen on the CAT scan. She was maintained on Cipro and Flagyl and had no diarrhea while she was in the hospital, and actually she was more on the constipation side. ID was consulted. She was thought to have severe C. diff or other forms of colitis. She was started on p.o. vancomycin as was well and her leukocytosis resolved. Again, she still had no diarrhea and after a few days was started on stool softeners.   Per ID, she is to be continued on Flagyl for a total of 10 days and she has another 9 days to go. It is also possible that this could be a reaction to Bactrim.   In regards to her right hip pain, she was seen by orthopedics. A bone scan showed arthritis. She has been seen by PT and recommendations of home health with PT, R.N., and also a rolling walker which we would provide.   In regard to the thrombocytopenia, there is no schistocytes on smear. Platelets went as low as 17 and the patient was seen by Dr. Kallie Edward and Dr. Ma Hillock at the cancer center. Low-grade DIC is thought to be the culprits, and at this point platelets have rebounded.   I discussed the case with Dr. Ma Hillock, and she can be seen by Dr. Kallie Edward on Friday for a CBC check.   The last 48 hours, platelets have rebounded nicely. The hyponatremia did resolve, and the last sodium was 132 with fluids. She does have a bout of hypokalemia and they were replaced.   Systemic inflammatory response syndrome has resolved, and she will be discharged with outpatient follow-up.   DISCHARGE  PHYSICAL EXAMINATION:  VITAL SIGNS: Afebrile. Temperature 98.3, pulse rate 87, respiratory rate 20, blood pressure 125/66. Oxygen saturation of 91% on room air.  GENERAL: A well-developed, elderly Caucasian female lying in bed in no obvious distress.  HEENT: Normocephalic, atraumatic. Moist mucous membranes.  NECK: Supple. The patient has  regular heart sound, S1, S2. No tachycardia.  LUNGS: Clear.  ABDOMEN: Soft, nontender, nondistended.  EXTREMITIES: No significant lower-extremity edema.  CODE STATUS: FULL CODE.   TOTAL TIME SPENT: 35 minutes.    ____________________________ Vivien Presto, MD sa:np D: 01/19/2013 14:24:01 ET T: 01/19/2013 18:32:46 ET JOB#: [id number]  cc: Vivien Presto, MD, <Dictator> Verl Dicker, MD  Vivien Presto MD ELECTRONICALLY SIGNED 02/08/2013 12:04

## 2014-09-23 NOTE — H&P (Signed)
PATIENT NAMELuvina, [name] [name] MR#:  [id number] DATE OF BIRTH:  [birth year]  DATE OF ADMISSION:  01/13/2013  PRIMARY CARE PHYSICIAN:  Dr. Verl [name] at [place]: Diarrhea.   HISTORY OF PRESENT ILLNESS: [name] who had a fall last Thursday,  did not tell anyone, had right hip pain. She went to [facility] and was found to have no fracture. They gave her [place] AN ALLERGY TO. She then started staying with her daughter. The last couple of days has been urinating every hour. They saw Dr. Novella [name], orthopedic over at [facility] who did another x-ray. He saw arthritis and put her on Tramadol. They took her to the PMD who put her on Bactrim for urinary tract infection. This morning at 2:15 a.m. she had massive diarrhea, felt like she was going to pass out,  was shaking, was breathing hard, and complained of pain in the hips and some soreness in the abdomen.  She came to the ER for further evaluation and found to have a high white count of 25.6, a low platelet count of 46 and a sodium of 128. She was tachycardic and had a low-grade temperature, and hospitalist services were contacted for further evaluation.   PAST MEDICAL HISTORY: Hypertension, macular degeneration, dry, history of sick sinus syndrome with a pacemaker and chronic pain.   PAST SURGICAL HISTORY: Pacemaker, cholecystectomy, left hip replacement, cataracts and appendix.   ALLERGIES: VICODIN, PENICILLIN, DILAUDID AND LIDODERM PATCH THAT IS MADE IN THE [place].  FAMILY HISTORY: Mother died of a CVA. Father had alcohol issues and a sister had colon cancer. Another sister had myocardial infarction, another sister had a CVA. Brother had [name] disease.   SOCIAL HISTORY: No smoking. Occasional wine. No drug use. She worked in [employer] in the past. A nephew lives with her.   MEDICATIONS: Include hydralazine 10 mg t.i.d., aspirin 81 mg daily, fish oil 1000 mg b.i.d., PreserVision daily,  Norvasc 5 mg daily, also on the medication list was listed as amlodipine not sure she is taking both of those. Losartan 50 mg b.i.d., lidocaine patch, Coreg 12.5 mg b.i.d. and recently started on Bactrim and Norco.   REVIEW OF SYSTEMS:  CONSTITUTIONAL: No fever. Positive for shaking chills. No weight loss. No weight gain, but she has been bloated for a few days.  EYES: She does have macular degeneration wears glasses.  EARS, NOSE, MOUTH, AND THROAT: Decreased hearing. No sore throat. No difficulty swallowing.  CARDIOVASCULAR: No chest pain. No palpitations.  RESPIRATORY: Positive for shortness of breath. No coughing. No sputum. No hemoptysis.  GASTROINTESTINAL: Positive for abdominal soreness. No nausea. No vomiting. Positive for massive diarrhea. No blood in the bowel movements.  GENITOURINARY: Positive for urinary frequency.  MUSCULOSKELETAL: Positive for bilateral hip pain, shoulder pain and knee pain. No back pain.  INTEGUMENT: No rashes or eruptions.  NEUROLOGIC: Felt wobbly on her feet and dizzy.  PSYCHIATRIC: No anxiety or depression.  ENDOCRINE: No thyroid problems.  HEMATOLOGIC/LYMPHATIC: I asked them about low platelets. They said they had low platelets before with infection.   PHYSICAL EXAMINATION: VITAL SIGNS: On presentation to the ER include temperature 99.2, pulse 98, respirations 24, blood pressure 161/76, pulse ox 93% on room air.  GENERAL: No respiratory distress.  EYES: Conjunctivae and lids normal. Pupils equal, round, and reactive to light. Extraocular muscles intact. No nystagmus.  EARS, NOSE, MOUTH, AND THROAT: Tympanic membranes: No erythema. Nasal mucosa: No erythema. Throat: No  erythema. No exudate seen. Lips and gums: No lesions.  NECK: No JVD. No bruits. No lymphadenopathy. No thyromegaly. No thyroid nodules palpated. RESPIRATORY: Lungs clear to auscultation. No use of accessory muscles to breathe. No rhonchi, rales, or wheeze heard.  CARDIOVASCULAR: S1, S2 normal.  +0/9 systolic ejection murmur. Carotid upstroke 2+ bilaterally. No bruits.  EXTREMITIES: Dorsalis pedis pulses 2+ bilaterally. Trace edema of the lower extremity.  ABDOMEN: Soft. Slight tenderness in the lower abdomen. No organomegaly/splenomegaly. Normoactive bowel sounds. No masses felt.  LYMPHATIC: No lymph nodes in the neck.  MUSCULOSKELETAL: Trace edema. No clubbing. No cyanosis. Good range of motion with hips, flexion and internal rotation bilaterally without pain, but this is an exam after the patient received morphine.  SKIN: Chronic lower extremity discoloration. No bruising seen over the hip.  NEUROLOGIC: Cranial nerves II through XII grossly intact. Deep tendon reflexes 2+ bilateral lower extremity. The patient is able to straight leg bilaterally. No issues, Babinski negative.  PSYCHIATRIC: The patient is oriented to person, place and time.   LABORATORY AND RADIOLOGICAL DATA: White blood cell count 25.6, hemoglobin and hematocrit 12.8 and 37.2, platelet count of 46, glucose 136, BUN 11, creatinine 0.74, sodium 128, potassium 4.3, chloride 96, CO2 22, calcium 9.2. Liver function tests: Albumin slightly low at 3.3, other liver function tests normal. Lipase 115. CT scan of the abdomen and pelvis showed transverse and descending colonic wall thickening consistent with colitis likely infectious in etiology, inflammatory changes T11 and T12 disk space, may indicate early disk osteomyelitis and bladder wall thickened, correlate with urinalysis.   ASSESSMENT AND PLAN: 1.  Systemic inflammatory response syndrome with colitis on CT scan. Leukocytosis, tachycardia and low-grade fever. Will send off stool studies. Empiric Cipro and Flagyl. Continue to monitor clinically on a day-to-day basis. We will put on a full liquid diet. IV fluids, p.r.n. IV morphine, p.r.n. IV Zofran.  2.  Hyponatremia, likely secondary to slight dehydration. We will give normal saline IV fluid hydration and continue to monitor  serial sodium.  3.  Thrombocytopenia. Unclear if this is infection-related or chronic. The patient does not have bruises with her fall the other day. I will hold the aspirin at this time. I will send off a manual diff to rule out schistocytes. I will send off a hepatitis C profile. Continue to monitor closely.  4.  Right hip pain. The patient is able to straight leg raise. We will get physical therapy evaluation and orthopedic evaluation likely bursitis from fall.  5.  Abnormal findings on CT scan with the spine. The patient does not have any tenderness on the spine, able to straight leg raise and reflexes and sensation are intact. I will get an orthopedic evaluation for further evaluation of this.  6.  Hypertension. I will hold the patient's Norvasc, but I will continue the patient's other medications at this point.  7.  Outpatient urinary tract infection. The patient was on Bactrim. Unfortunately no urine culture has been sent. No urinalysis has been sent. I will order both of those. The Cipro would cover. We will stop Bactrim at this time.  8.  No EKG was ordered. I will order an EKG on admission.   TIME SPENT ON ADMISSION: 55 minutes.   CODE STATUS: THE PATIENT IS A FULL CODE.    ____________________________ Tana Conch. Leslye Peer, MD [id number] D: 01/13/2013 08:46:25 ET T: 01/13/2013 09:17:11 ET JOB#: [id number]  cc: Tana Conch. Leslye Peer, MD, <Dictator> Dr. Verl [name] at [facility] MD  ELECTRONICALLY SIGNED 01/15/2013 17:02

## 2014-09-23 NOTE — Consult Note (Signed)
HEMATOLOGY followup note - resting in bed, weakness slowly improving  Denies obvious bleeding symptoms.denies fever or chills.  Eating better.alert and oriented, NAD.           vitals - 99, 109, 21, 167/82, 94% on room air           Lungs - bilateral good air entry           Abdomen - soft, nontenderhemoglobin 10.5, WBC 14,800, ANC 10,600, platelets 17K, creatinine 0.55.  Haptoglobin and LDH unremarkable.  FDP 10-40.  HCV antibody negative. Elderly female with colitis,SIRS, along with significant thrombocytopenia most likely secondary to ongoing infection/sepsis with possible low-grade DIC given elevated FDPs. No bleeding symptoms.  Otherwise recent serum LDH, haptoglobin, creatinine, HCV antibody all unremarkable.  No new recommendations.  Continue to monitor CBC and platelets daily, monitor for bleeding symptoms.  Recommend platelet transfusion if active bleeding or platelt count< 10K.  If thrombocytopenia does not improve after acute issues are resolved, she may need further workup in the future. Hematology will continue to follow intermittently.  Electronic Signatures: Jonn Shingles (MD)  (Signed on 16-Aug-14 16:39)  Authored  Last Updated: 16-Aug-14 16:39 by Jonn Shingles (MD)

## 2014-09-23 NOTE — Consult Note (Signed)
Brief Consult Note: Diagnosis: colitis, TCP.   Patient was seen by consultant.   Consult note dictated.   Recommend further assessment or treatment.   Orders entered.   Comments: I suspect severe c diff colitis vs ischemic colitis cont cipro and flagyl IV but start vanco oral 250 qid await stool studies Consider GI consult if worsens Thank you for consult - can call over weekend if needed ([phone]  Electronic Signatures: Angelena Form (MD)  (Signed 15-Aug-14 16:43)  Authored: Brief Consult Note   Last Updated: 15-Aug-14 16:43 by Angelena Form (MD)

## 2014-11-22 DIAGNOSIS — Z950 Presence of cardiac pacemaker: Secondary | ICD-10-CM | POA: Diagnosis not present

## 2014-12-22 DIAGNOSIS — I119 Hypertensive heart disease without heart failure: Secondary | ICD-10-CM | POA: Diagnosis not present

## 2014-12-22 DIAGNOSIS — Z0000 Encounter for general adult medical examination without abnormal findings: Secondary | ICD-10-CM | POA: Diagnosis not present

## 2014-12-22 DIAGNOSIS — E780 Pure hypercholesterolemia: Secondary | ICD-10-CM | POA: Diagnosis not present

## 2014-12-23 ENCOUNTER — Other Ambulatory Visit: Payer: Self-pay

## 2014-12-23 ENCOUNTER — Emergency Department
Admission: EM | Admit: 2014-12-23 | Discharge: 2014-12-24 | Disposition: A | Discharge status: Home / Self Care | Payer: Medicare Other | Attending: Emergency Medicine | Admitting: Emergency Medicine

## 2014-12-23 ENCOUNTER — Encounter: Payer: Self-pay | Admitting: Radiology

## 2014-12-23 ENCOUNTER — Emergency Department: Payer: Medicare Other

## 2014-12-23 DIAGNOSIS — R930 Abnormal findings on diagnostic imaging of skull and head, not elsewhere classified: Secondary | ICD-10-CM | POA: Diagnosis not present

## 2014-12-23 DIAGNOSIS — Z79899 Other long term (current) drug therapy: Secondary | ICD-10-CM | POA: Insufficient documentation

## 2014-12-23 DIAGNOSIS — Z7982 Long term (current) use of aspirin: Secondary | ICD-10-CM | POA: Insufficient documentation

## 2014-12-23 DIAGNOSIS — I10 Essential (primary) hypertension: Secondary | ICD-10-CM | POA: Insufficient documentation

## 2014-12-23 DIAGNOSIS — R5383 Other fatigue: Secondary | ICD-10-CM | POA: Diagnosis present

## 2014-12-23 DIAGNOSIS — R42 Dizziness and giddiness: Secondary | ICD-10-CM | POA: Diagnosis not present

## 2014-12-23 DIAGNOSIS — R531 Weakness: Secondary | ICD-10-CM | POA: Insufficient documentation

## 2014-12-23 DIAGNOSIS — Z880 Allergy status to penicillin: Secondary | ICD-10-CM | POA: Diagnosis not present

## 2014-12-23 DIAGNOSIS — M6281 Muscle weakness (generalized): Secondary | ICD-10-CM | POA: Diagnosis not present

## 2014-12-23 DIAGNOSIS — N390 Urinary tract infection, site not specified: Secondary | ICD-10-CM | POA: Insufficient documentation

## 2014-12-23 HISTORY — DX: Hyperlipidemia, unspecified: E78.5

## 2014-12-23 HISTORY — DX: Syncope and collapse: R55

## 2014-12-23 HISTORY — DX: Vascular disorder of intestine, unspecified: K55.9

## 2014-12-23 HISTORY — DX: Thrombocytopenia, unspecified: D69.6

## 2014-12-23 HISTORY — DX: Presence of cardiac pacemaker: Z95.0

## 2014-12-23 HISTORY — DX: Anemia, unspecified: D64.9

## 2014-12-23 HISTORY — DX: Atrioventricular block, second degree: I44.1

## 2014-12-23 HISTORY — DX: Essential (primary) hypertension: I10

## 2014-12-23 LAB — CBC
HEMATOCRIT: 34.8 % — AB (ref 35.0–47.0)
Hemoglobin: 11.8 g/dL — ABNORMAL LOW (ref 12.0–16.0)
MCH: 30.5 pg (ref 26.0–34.0)
MCHC: 33.8 g/dL (ref 32.0–36.0)
MCV: 90.3 fL (ref 80.0–100.0)
PLATELETS: 73 10*3/uL — AB (ref 150–440)
RBC: 3.85 MIL/uL (ref 3.80–5.20)
RDW: 14 % (ref 11.5–14.5)
WBC: 13.6 10*3/uL — ABNORMAL HIGH (ref 3.6–11.0)

## 2014-12-23 LAB — URINALYSIS COMPLETE WITH MICROSCOPIC (ARMC ONLY)
Bilirubin Urine: NEGATIVE
GLUCOSE, UA: NEGATIVE mg/dL
KETONES UR: NEGATIVE mg/dL
Nitrite: POSITIVE — AB
PROTEIN: NEGATIVE mg/dL
Specific Gravity, Urine: 1.006 (ref 1.005–1.030)
pH: 6 (ref 5.0–8.0)

## 2014-12-23 LAB — BASIC METABOLIC PANEL
Anion gap: 11 (ref 5–15)
BUN: 10 mg/dL (ref 6–20)
CO2: 22 mmol/L (ref 22–32)
Calcium: 8.9 mg/dL (ref 8.9–10.3)
Chloride: 101 mmol/L (ref 101–111)
Creatinine, Ser: 0.77 mg/dL (ref 0.44–1.00)
GFR calc Af Amer: >60 mL/min (ref >60)
GFR calc non Af Amer: >60 mL/min (ref >60)
Glucose, Bld: 124 mg/dL — ABNORMAL HIGH (ref 65–99)
POTASSIUM: 3.7 mmol/L (ref 3.5–5.1)
SODIUM: 134 mmol/L — AB (ref 135–145)

## 2014-12-23 LAB — TROPONIN I: TROPONIN I: 0.04 ng/mL — AB (ref <0.031)

## 2014-12-23 LAB — GLUCOSE, CAPILLARY: Glucose-Capillary: 119 mg/dL — ABNORMAL HIGH (ref 65–99)

## 2014-12-23 MED ORDER — ENOXAPARIN SODIUM 100 MG/ML ~~LOC~~ SOLN: 100.0000 mg | SUBCUTANEOUS | Status: DC

## 2014-12-23 MED ORDER — DEXTROSE 5 % IV SOLN
1.0000 g | INTRAVENOUS | Status: AC
  Administered 2014-12-23: 1 g via INTRAVENOUS
  Filled 2014-12-23: qty 10

## 2014-12-23 NOTE — ED Notes (Signed)
According to EMS, pt reports that she has been experiencing weakness and fatigue since this AM, specifically having a difficult time getting of the toilet and walking around her home.[name] Pt presents to ED A+OX4 and denies pain.

## 2014-12-23 NOTE — ED Provider Notes (Signed)
[facility] Emergency Department Provider Note  ____________________________________________  Time seen: Approximately 7:24 PM  I have reviewed the triage vital signs and the nursing notes.   HISTORY  Chief Complaint Fatigue    HPI [name] is a 81 y.o. female who is generally healthy and appears younger than her stated age who reports waking up this morning and feeling generalized weakness and fatigue.  It is been difficult for her to walk without assistance today and once she was seated on the toilet, she had a very difficult time lifting herself back off.  She has had similar episodes in the past when she has had an infection such as a UTI, but has had no symptoms recently including no chest pain, shortness of breath, fever/chills, abdominal pain, nor dysuria.  She describes the symptoms as moderate in severity.  She has not had an headache or visual disturbances.   Past Medical History  Diagnosis Date  . HTN (hypertension)   . Syncope   . Hyperlipidemia   . Ischemic colitis   . Thrombocytopenia   . Mobitz type 2 second degree heart block   . Anemia   . Pacemaker     There are no active problems to display for this patient.   Past Surgical History  Procedure Laterality Date  . Left hip replacement       Current Outpatient Rx  Name  Route  Sig  Dispense  Refill  . amLODipine (NORVASC) 5 MG tablet   Oral   Take 5 mg by mouth daily.         Marland Kitchen aspirin EC 81 MG tablet   Oral   Take 81 mg by mouth daily.         . bimatoprost (LUMIGAN) 0.01 % SOLN   Both Eyes   Place 1 drop into both eyes daily.         . carvedilol (COREG) 12.5 MG tablet   Oral   Take 12.5 mg by mouth 2 (two) times daily.         . furosemide (LASIX) 20 MG tablet   Oral   Take 20 mg by mouth daily as needed for edema.         . hydrALAZINE (APRESOLINE) 10 MG tablet   Oral   Take 10 mg by mouth 3 (three) times daily.         Marland Kitchen  HYDROcodone-acetaminophen (NORCO/VICODIN) 5-325 MG per tablet   Oral   Take 1 tablet by mouth 4 (four) times daily as needed for moderate pain.         Marland Kitchen lidocaine (ASPERCREME W/LIDOCAINE) 4 % cream   Topical   Apply 1 application topically as needed.         Marland Kitchen losartan (COZAAR) 100 MG tablet   Oral   Take 50 mg by mouth 2 (two) times daily.         . Multiple Vitamins-Minerals (MULTIVITAMIN PO)   Oral   Take 1 tablet by mouth daily.         . Multiple Vitamins-Minerals (PRESERVISION AREDS 2) CAPS   Oral   Take 2 capsules by mouth daily.         . Omega-3 Fatty Acids (FISH OIL) 1000 MG CAPS   Oral   Take 2 capsules by mouth daily.         . cephALEXin (KEFLEX) 500 MG capsule   Oral   Take 1 capsule (500 mg total) by mouth 2 (two)  times daily.   14 capsule   0     Allergies Atenolol; Penicillins; and Dilaudid  Family History  Problem Relation Age of Onset  . Stroke Mother     Social History History  Substance Use Topics  . Smoking status: Never Smoker   . Smokeless tobacco: Never Used  . Alcohol Use: Yes    Review of Systems Constitutional: No fever/chills.  Generalized weakness Eyes: No visual changes. ENT: No sore throat. Cardiovascular: Denies chest pain. Respiratory: Denies shortness of breath. Gastrointestinal: No abdominal pain.  No nausea, no vomiting.  No diarrhea.  No constipation. Genitourinary: Negative for dysuria. Musculoskeletal: Negative for back pain. Skin: Negative for rash. Neurological: Negative for headaches, focal weakness or numbness.  10-point ROS otherwise negative.  ____________________________________________   PHYSICAL EXAM:  VITAL SIGNS: ED Triage Vitals  Enc Vitals Group     BP 12/23/14 1910 140/69 mmHg     Pulse Rate 12/23/14 1910 79     Resp 12/23/14 1910 22     Temp 12/23/14 1910 98.4 F (36.9 C)     Temp Source 12/23/14 1910 Oral     SpO2 12/23/14 1910 98 %     Weight --      Height --      Head  Cir --      Peak Flow --      Pain Score 12/23/14 1911 0     Pain Loc --      Pain Edu? --      Excl. in [place]? --     Constitutional: Alert and oriented. Well appearing and in no acute distress.  Appears younger than stated age Eyes: Conjunctivae are normal. PERRL. EOMI. Head: Atraumatic. Nose: No congestion/rhinnorhea. Mouth/Throat: Mucous membranes are moist.  Oropharynx non-erythematous. Neck: No stridor.   Cardiovascular: Normal rate, regular rhythm. Grossly normal heart sounds.  Good peripheral circulation. Respiratory: Normal respiratory effort.  No retractions. Lungs CTAB. Gastrointestinal: Soft and nontender. No distention. No abdominal bruits. No CVA tenderness. Musculoskeletal: No lower extremity tenderness nor edema.  No joint effusions. Neurologic:  Normal speech and language. No gross focal neurologic deficits are appreciated.  Skin:  Skin is warm, dry and intact. No rash noted. Psychiatric: Mood and affect are normal. Speech and behavior are normal.  ____________________________________________   LABS (all labs ordered are listed, but only abnormal results are displayed)  Labs Reviewed  BASIC METABOLIC PANEL - Abnormal; Notable for the following:    Sodium 134 (*)    Glucose, Bld 124 (*)    All other components within normal limits  CBC - Abnormal; Notable for the following:    WBC 13.6 (*)    Hemoglobin 11.8 (*)    HCT 34.8 (*)    Platelets 73 (*)    All other components within normal limits  URINALYSIS COMPLETEWITH MICROSCOPIC ([facility] ONLY) - Abnormal; Notable for the following:    Color, Urine YELLOW (*)    APPearance HAZY (*)    Hgb urine dipstick 2+ (*)    Nitrite POSITIVE (*)    Leukocytes, UA 3+ (*)    Bacteria, UA MANY (*)    Squamous Epithelial / LPF 0-5 (*)    All other components within normal limits  TROPONIN I - Abnormal; Notable for the following:    Troponin I 0.04 (*)    All other components within normal limits  GLUCOSE, CAPILLARY -  Abnormal; Notable for the following:    Glucose-Capillary 119 (*)    All other  components within normal limits  TROPONIN I - Abnormal; Notable for the following:    Troponin I 0.04 (*)    All other components within normal limits  URINE CULTURE  CBG MONITORING, ED   ____________________________________________  EKG  ED ECG REPORT I, Jessly Lebeck, the attending physician, personally viewed and interpreted this ECG.   Date: 12/23/2014  EKG Time: 19:57  Rate: 77  Rhythm: Electronic ventricular pacer  Axis: Left axis deviation  Intervals:Widened QRS/ventricular pacemaker  ST&T Change: Non-specific ST segment / T-wave changes, but no evidence of acute ischemia.   ____________________________________________  RADIOLOGY  Ct Head Wo Contrast  12/23/2014   CLINICAL DATA:  Weakness, fatigue  EXAM: CT HEAD WITHOUT CONTRAST  TECHNIQUE: Contiguous axial images were obtained from the base of the skull through the vertex without intravenous contrast.  COMPARISON:  None.  FINDINGS: There is a near CSF density lesion in the region of the right basal ganglia image 11 which could represent a perivascular space or possibly remote lacunar infarct. Mild periventricular white matter hypodensity may indicate small vessel ischemic change. No focal vascular territory infarct, acute hemorrhage, or mass is seen. No midline shift. No disproportionate ventriculomegaly with respect to the degree of cortical volume loss. No skull fracture. Orbits and paranasal sinuses are intact.  IMPRESSION: No acute intracranial finding.   Electronically Signed   By: Conchita Paris M.D.   On: 12/23/2014 20:47    ____________________________________________   PROCEDURES  Procedure(s) performed: None  Critical Care performed: No ____________________________________________   INITIAL IMPRESSION / ASSESSMENT AND PLAN / ED COURSE  Pertinent labs & imaging results that were available during my care of the patient were  reviewed by me and considered in my medical decision making (see chart for details).  The patient has a generally reassuring workup.  She has a mild leukocytosis and infection in her urine, so I believe that her weakness is caused by urinary tract infection.  Her very slightly elevated troponin of 0.04 was stable over the course of 3 hours and she has not had any chest pain or shortness of breath.  I gave her a dose of ceftriaxone 1 g IV and a prescription for Keflex and recommended close outpatient follow-up.  The patient and her daughter understand the diagnosis and the plan for follow-up.  I gave them my usual and customary return precautions.  ____________________________________________  FINAL CLINICAL IMPRESSION(S) / ED DIAGNOSES  Final diagnoses:  UTI (lower urinary tract infection)  Generalized weakness      NEW MEDICATIONS STARTED DURING THIS VISIT:  New Prescriptions   CEPHALEXIN (KEFLEX) 500 MG CAPSULE    Take 1 capsule (500 mg total) by mouth 2 (two) times daily.     Hinda Kehr, MD 12/24/14 [phone]

## 2014-12-24 LAB — TROPONIN I: Troponin I: 0.04 ng/mL — ABNORMAL HIGH (ref <0.031)

## 2014-12-24 MED ORDER — CEPHALEXIN 500 MG PO CAPS: 500.0000 mg | ORAL_CAPSULE | Freq: Two times a day (BID) | ORAL | Status: DC

## 2014-12-24 NOTE — Discharge Instructions (Signed)
As we discussed, we believe that your generalized weakness is due to a urinary tract infection.  We gave me a dose of IV antibiotics in the emergency department and have provided a prescription to take starting tomorrow morning.  Please take the full course of antibiotics as written.  Follow up with your regular doctor on Monday.  If he develop any new or worsening symptoms that concern you, please return immediately to the emergency department.   Urinary Tract Infection Urinary tract infections (UTIs) can develop anywhere along your urinary tract. Your urinary tract is your body's drainage system for removing wastes and extra water. Your urinary tract includes two kidneys, two ureters, a bladder, and a urethra. Your kidneys are a pair of bean-shaped organs. Each kidney is about the size of your fist. They are located below your ribs, one on each side of your spine. CAUSES Infections are caused by microbes, which are microscopic organisms, including fungi, viruses, and bacteria. These organisms are so small that they can only be seen through a microscope. Bacteria are the microbes that most commonly cause UTIs. SYMPTOMS  Symptoms of UTIs may vary by age and gender of the patient and by the location of the infection. Symptoms in young women typically include a frequent and intense urge to urinate and a painful, burning feeling in the bladder or urethra during urination. Older women and men are more likely to be tired, shaky, and weak and have muscle aches and abdominal pain. A fever may mean the infection is in your kidneys. Other symptoms of a kidney infection include pain in your back or sides below the ribs, nausea, and vomiting. DIAGNOSIS To diagnose a UTI, your caregiver will ask you about your symptoms. Your caregiver also will ask to provide a urine sample. The urine sample will be tested for bacteria and white blood cells. White blood cells are made by your body to help fight infection. TREATMENT    Typically, UTIs can be treated with medication. Because most UTIs are caused by a bacterial infection, they usually can be treated with the use of antibiotics. The choice of antibiotic and length of treatment depend on your symptoms and the type of bacteria causing your infection. HOME CARE INSTRUCTIONS  If you were prescribed antibiotics, take them exactly as your caregiver instructs you. Finish the medication even if you feel better after you have only taken some of the medication.  Drink enough water and fluids to keep your urine clear or pale yellow.  Avoid caffeine, tea, and carbonated beverages. They tend to irritate your bladder.  Empty your bladder often. Avoid holding urine for long periods of time.  Empty your bladder before and after sexual intercourse.  After a bowel movement, women should cleanse from front to back. Use each tissue only once. SEEK MEDICAL CARE IF:   You have back pain.  You develop a fever.  Your symptoms do not begin to resolve within 3 days. SEEK IMMEDIATE MEDICAL CARE IF:   You have severe back pain or lower abdominal pain.  You develop chills.  You have nausea or vomiting.  You have continued burning or discomfort with urination. MAKE SURE YOU:   Understand these instructions.  Will watch your condition.  Will get help right away if you are not doing well or get worse. Document Released: 02/27/2005 Document Revised: 11/19/2011 Document Reviewed: 06/28/2011 [facility] Patient Information 2015 [place], [place]. This information is not intended to replace advice given to you by your health care provider.  Make sure you discuss any questions you have with your health care provider. ° °

## 2014-12-28 LAB — URINE CULTURE: SPECIAL REQUESTS: NORMAL

## 2015-01-10 ENCOUNTER — Inpatient Hospital Stay
Admission: EM | Admit: 2015-01-10 | Discharge: 2015-01-12 | DRG: 872 | Disposition: A | Discharge status: Home / Self Care | Payer: Medicare Other | Attending: Internal Medicine | Admitting: Internal Medicine

## 2015-01-10 ENCOUNTER — Encounter: Payer: Self-pay | Admitting: Emergency Medicine

## 2015-01-10 ENCOUNTER — Emergency Department: Payer: Medicare Other

## 2015-01-10 DIAGNOSIS — Z96642 Presence of left artificial hip joint: Secondary | ICD-10-CM | POA: Diagnosis present

## 2015-01-10 DIAGNOSIS — A419 Sepsis, unspecified organism: Secondary | ICD-10-CM

## 2015-01-10 DIAGNOSIS — Z823 Family history of stroke: Secondary | ICD-10-CM

## 2015-01-10 DIAGNOSIS — D649 Anemia, unspecified: Secondary | ICD-10-CM | POA: Diagnosis present

## 2015-01-10 DIAGNOSIS — Z66 Do not resuscitate: Secondary | ICD-10-CM | POA: Diagnosis present

## 2015-01-10 DIAGNOSIS — A4151 Sepsis due to Escherichia coli [E. coli]: Principal | ICD-10-CM | POA: Diagnosis present

## 2015-01-10 DIAGNOSIS — R7881 Bacteremia: Secondary | ICD-10-CM | POA: Diagnosis present

## 2015-01-10 DIAGNOSIS — N3000 Acute cystitis without hematuria: Secondary | ICD-10-CM | POA: Diagnosis not present

## 2015-01-10 DIAGNOSIS — R509 Fever, unspecified: Secondary | ICD-10-CM | POA: Diagnosis not present

## 2015-01-10 DIAGNOSIS — N390 Urinary tract infection, site not specified: Secondary | ICD-10-CM

## 2015-01-10 DIAGNOSIS — Z87891 Personal history of nicotine dependence: Secondary | ICD-10-CM

## 2015-01-10 DIAGNOSIS — E785 Hyperlipidemia, unspecified: Secondary | ICD-10-CM | POA: Diagnosis present

## 2015-01-10 DIAGNOSIS — R4182 Altered mental status, unspecified: Secondary | ICD-10-CM | POA: Diagnosis present

## 2015-01-10 DIAGNOSIS — Z7982 Long term (current) use of aspirin: Secondary | ICD-10-CM | POA: Diagnosis not present

## 2015-01-10 DIAGNOSIS — I10 Essential (primary) hypertension: Secondary | ICD-10-CM | POA: Diagnosis present

## 2015-01-10 DIAGNOSIS — R0902 Hypoxemia: Secondary | ICD-10-CM | POA: Diagnosis present

## 2015-01-10 DIAGNOSIS — I441 Atrioventricular block, second degree: Secondary | ICD-10-CM | POA: Diagnosis present

## 2015-01-10 DIAGNOSIS — I959 Hypotension, unspecified: Secondary | ICD-10-CM | POA: Diagnosis present

## 2015-01-10 DIAGNOSIS — Z880 Allergy status to penicillin: Secondary | ICD-10-CM

## 2015-01-10 DIAGNOSIS — D696 Thrombocytopenia, unspecified: Secondary | ICD-10-CM | POA: Diagnosis present

## 2015-01-10 LAB — URINALYSIS COMPLETE WITH MICROSCOPIC (ARMC ONLY)
Bilirubin Urine: NEGATIVE
Glucose, UA: NEGATIVE mg/dL
Ketones, ur: NEGATIVE mg/dL
Nitrite: NEGATIVE
Protein, ur: 30 mg/dL — AB
Specific Gravity, Urine: 1.005 (ref 1.005–1.030)
pH: 6 (ref 5.0–8.0)

## 2015-01-10 LAB — COMPREHENSIVE METABOLIC PANEL
ALBUMIN: 3.5 g/dL (ref 3.5–5.0)
ALT: 14 U/L (ref 14–54)
AST: 33 U/L (ref 15–41)
Alkaline Phosphatase: 74 U/L (ref 38–126)
Anion gap: 13 (ref 5–15)
BILIRUBIN TOTAL: 1.1 mg/dL (ref 0.3–1.2)
BUN: 11 mg/dL (ref 6–20)
CALCIUM: 8.6 mg/dL — AB (ref 8.9–10.3)
CO2: 18 mmol/L — ABNORMAL LOW (ref 22–32)
CREATININE: 1.03 mg/dL — AB (ref 0.44–1.00)
Chloride: 95 mmol/L — ABNORMAL LOW (ref 101–111)
GFR, EST AFRICAN AMERICAN: 54 mL/min — AB (ref >60)
GFR, EST NON AFRICAN AMERICAN: 46 mL/min — AB (ref >60)
Glucose, Bld: 148 mg/dL — ABNORMAL HIGH (ref 65–99)
POTASSIUM: 3.5 mmol/L (ref 3.5–5.1)
Sodium: 126 mmol/L — ABNORMAL LOW (ref 135–145)
Total Protein: 7.1 g/dL (ref 6.5–8.1)

## 2015-01-10 LAB — LACTIC ACID, PLASMA
LACTIC ACID, VENOUS: 1 mmol/L (ref 0.5–2.0)
Lactic Acid, Venous: 2.2 mmol/L (ref 0.5–2.0)

## 2015-01-10 LAB — CBC WITH DIFFERENTIAL/PLATELET
BASOS ABS: 0 10*3/uL (ref 0–0.1)
Basophils Relative: 0 %
EOS ABS: 0 10*3/uL (ref 0–0.7)
HCT: 34.2 % — ABNORMAL LOW (ref 35.0–47.0)
HEMOGLOBIN: 11.5 g/dL — AB (ref 12.0–16.0)
LYMPHS ABS: 0.3 10*3/uL — AB (ref 1.0–3.6)
Lymphocytes Relative: 2 %
MCH: 30.2 pg (ref 26.0–34.0)
MCHC: 33.5 g/dL (ref 32.0–36.0)
MCV: 90.2 fL (ref 80.0–100.0)
MONO ABS: 0.4 10*3/uL (ref 0.2–0.9)
Neutro Abs: 16.9 10*3/uL — ABNORMAL HIGH (ref 1.4–6.5)
Platelets: 69 10*3/uL — ABNORMAL LOW (ref 150–440)
RBC: 3.8 MIL/uL (ref 3.80–5.20)
RDW: 14 % (ref 11.5–14.5)
WBC: 17.6 10*3/uL — ABNORMAL HIGH (ref 3.6–11.0)

## 2015-01-10 LAB — PROTIME-INR
INR: 1.18
Prothrombin Time: 15.2 seconds — ABNORMAL HIGH (ref 11.4–15.0)

## 2015-01-10 LAB — TROPONIN I: Troponin I: 0.03 ng/mL (ref <0.031)

## 2015-01-10 LAB — MRSA PCR SCREENING: MRSA by PCR: NEGATIVE

## 2015-01-10 MED ORDER — VANCOMYCIN HCL IN DEXTROSE 1-5 GM/200ML-% IV SOLN
1000.0000 mg | Freq: Once | INTRAVENOUS | Status: AC
  Administered 2015-01-10: 1000 mg via INTRAVENOUS
  Filled 2015-01-10: qty 200

## 2015-01-10 MED ORDER — SENNOSIDES-DOCUSATE SODIUM 8.6-50 MG PO TABS
1.0000 | ORAL_TABLET | Freq: Every evening | ORAL | Status: DC | PRN
Start: 2015-01-10 — End: 2015-01-12
  Filled 2015-01-10: qty 1

## 2015-01-10 MED ORDER — ACETAMINOPHEN 650 MG RE SUPP: 650.0000 mg | Freq: Four times a day (QID) | RECTAL | Status: DC | PRN

## 2015-01-10 MED ORDER — SODIUM CHLORIDE 0.9 % IV BOLUS (SEPSIS): 500.0000 mL | INTRAVENOUS | Status: DC

## 2015-01-10 MED ORDER — DEXTROSE 5 % IV SOLN
1.0000 g | INTRAVENOUS | Status: DC
  Administered 2015-01-10 – 2015-01-11 (×2): 1 g via INTRAVENOUS
  Filled 2015-01-10 (×4): qty 10

## 2015-01-10 MED ORDER — LEVOFLOXACIN IN D5W 750 MG/150ML IV SOLN
750.0000 mg | Freq: Once | INTRAVENOUS | Status: AC
  Administered 2015-01-10: 750 mg via INTRAVENOUS
  Filled 2015-01-10: qty 150

## 2015-01-10 MED ORDER — DEXTROSE 5 % IV SOLN
2.0000 g | Freq: Once | INTRAVENOUS | Status: AC
  Administered 2015-01-10: 2 g via INTRAVENOUS
  Filled 2015-01-10: qty 2

## 2015-01-10 MED ORDER — HYDROCODONE-ACETAMINOPHEN 5-325 MG PO TABS: 1.0000 | ORAL_TABLET | Freq: Four times a day (QID) | ORAL | Status: DC | PRN

## 2015-01-10 MED ORDER — SODIUM CHLORIDE 0.9 % IV BOLUS (SEPSIS)
1000.0000 mL | INTRAVENOUS | Status: AC
  Administered 2015-01-10: 1000 mL via INTRAVENOUS

## 2015-01-10 MED ORDER — SODIUM CHLORIDE 0.9 % IV SOLN
INTRAVENOUS | Status: DC
  Administered 2015-01-10 – 2015-01-12 (×3): via INTRAVENOUS

## 2015-01-10 MED ORDER — ALUM & MAG HYDROXIDE-SIMETH 200-200-20 MG/5ML PO SUSP: 30.0000 mL | Freq: Four times a day (QID) | ORAL | Status: DC | PRN

## 2015-01-10 MED ORDER — LATANOPROST 0.005 % OP SOLN
1.0000 [drp] | Freq: Every day | OPHTHALMIC | Status: DC
  Administered 2015-01-10 – 2015-01-11 (×2): 1 [drp] via OPHTHALMIC
  Filled 2015-01-10: qty 2.5

## 2015-01-10 MED ORDER — SODIUM CHLORIDE 0.9 % IJ SOLN
3.0000 mL | Freq: Two times a day (BID) | INTRAMUSCULAR | Status: DC
  Administered 2015-01-10 – 2015-01-12 (×3): 3 mL via INTRAVENOUS

## 2015-01-10 MED ORDER — ONDANSETRON HCL 4 MG PO TABS: 4.0000 mg | ORAL_TABLET | Freq: Four times a day (QID) | ORAL | Status: DC | PRN

## 2015-01-10 MED ORDER — ACETAMINOPHEN 325 MG PO TABS
650.0000 mg | ORAL_TABLET | Freq: Four times a day (QID) | ORAL | Status: DC | PRN
Start: 2015-01-10 — End: 2015-01-12

## 2015-01-10 MED ORDER — ASPIRIN EC 81 MG PO TBEC
81.0000 mg | DELAYED_RELEASE_TABLET | Freq: Every day | ORAL | Status: DC
  Administered 2015-01-10 – 2015-01-12 (×3): 81 mg via ORAL
  Filled 2015-01-10 (×3): qty 1

## 2015-01-10 MED ORDER — HYDROCODONE-ACETAMINOPHEN 5-325 MG PO TABS: 1.0000 | ORAL_TABLET | ORAL | Status: DC | PRN

## 2015-01-10 MED ORDER — FUROSEMIDE 20 MG PO TABS: 20.0000 mg | ORAL_TABLET | Freq: Every day | ORAL | Status: DC | PRN

## 2015-01-10 MED ORDER — ONDANSETRON HCL 4 MG/2ML IJ SOLN
4.0000 mg | Freq: Four times a day (QID) | INTRAMUSCULAR | Status: DC | PRN
Start: 2015-01-10 — End: 2015-01-12

## 2015-01-10 NOTE — ED Notes (Signed)
Code  Sepsis  Call  To  [facility]  ([facility])

## 2015-01-10 NOTE — H&P (Addendum)
[facility] [place] [facility] NAME: [name]    MR#:  [id number]  DATE OF BIRTH:  12/03/1922  DATE OF ADMISSION:  01/10/2015  PRIMARY CARE PHYSICIAN: Ivor Reining, MD   REQUESTING/REFERRING PHYSICIAN: Dr. Claiborne Billings in [place]:  Confusion and lethargy HISTORY OF PRESENT ILLNESS:  [name]  is a 81 y.o. female with a known history of hypertension and pacemaker who presents with above complaint. Patient was seen in emergency room proxy 10 days ago and diagnosed with the ureteric infection and was sent home on antibiotics for 10 days. She presents today with lethargy, sluggishness and confusion. It is noted that she has urinary tract infection. Her urine culture from approximately a week ago showed Escherichia coli sensitive to Rocephin however resistant to ciprofloxacin. In the emergency room she was noted have a fever and tachycardia. She was treated with vancomycin, AZTREONAM and Levaquin. She is received 2 L of normal saline bolus her blood pressure remains low normal.  PAST MEDICAL HISTORY:   Past Medical History  Diagnosis Date  . HTN (hypertension)   . Syncope   . Hyperlipidemia   . Ischemic colitis   . Thrombocytopenia   . Mobitz type 2 second degree heart block   . Anemia   . Pacemaker     PAST SURGICAL HISTORY:   Past Surgical History  Procedure Laterality Date  . Left hip replacement     . Cholecystectomy      SOCIAL HISTORY:   History  Substance Use Topics  . Smoking status: Former Research scientist (life sciences)  . Smokeless tobacco: Never Used  . Alcohol Use: No    FAMILY HISTORY:   Family History  Problem Relation Age of Onset  . Stroke Mother     DRUG ALLERGIES:   Allergies  Allergen Reactions  . Atenolol Other (See Comments)    Complete Heart Block  . Penicillins Other (See Comments)  . Dilaudid [Hydromorphone Hcl] Rash     REVIEW OF SYSTEMS:  CONSTITUTIONAL: Positive fever, chills and fatigue EYES: No  blurred or double vision.  EARS, NOSE, AND THROAT: No tinnitus or ear pain.  RESPIRATORY: No cough, shortness of breath, wheezing or hemoptysis.  CARDIOVASCULAR: No chest pain, orthopnea, edema.  GASTROINTESTINAL: No nausea, vomiting, diarrhea or abdominal pain.  GENITOURINARY: No dysuria, hematuria.  ENDOCRINE: No polyuria, nocturia,  HEMATOLOGY: No anemia, easy bruising or bleeding SKIN: No rash or lesion. MUSCULOSKELETAL: No joint pain or arthritis.   NEUROLOGIC: No tingling, numbness, weakness.  PSYCHIATRY: No anxiety or depression.   MEDICATIONS AT HOME:   Prior to Admission medications   Medication Sig Start Date End Date Taking? Authorizing Provider  amLODipine (NORVASC) 5 MG tablet Take 5 mg by mouth daily.    Historical Provider, MD  aspirin EC 81 MG tablet Take 81 mg by mouth daily.    Historical Provider, MD  bimatoprost (LUMIGAN) 0.01 % SOLN Place 1 drop into both eyes daily.    Historical Provider, MD  carvedilol (COREG) 12.5 MG tablet Take 12.5 mg by mouth 2 (two) times daily.    Historical Provider, MD  cephALEXin (KEFLEX) 500 MG capsule Take 1 capsule (500 mg total) by mouth 2 (two) times daily. 12/24/14   Hinda Kehr, MD  furosemide (LASIX) 20 MG tablet Take 20 mg by mouth daily as needed for edema.    Historical Provider, MD  hydrALAZINE (APRESOLINE) 10 MG tablet Take 10 mg by mouth 3 (three) times daily.  Historical Provider, MD  HYDROcodone-acetaminophen (NORCO/VICODIN) 5-325 MG per tablet Take 1 tablet by mouth 4 (four) times daily as needed for moderate pain.    Historical Provider, MD  lidocaine (ASPERCREME W/LIDOCAINE) 4 % cream Apply 1 application topically as needed.    Historical Provider, MD  losartan (COZAAR) 100 MG tablet Take 50 mg by mouth 2 (two) times daily.    Historical Provider, MD  Multiple Vitamins-Minerals (MULTIVITAMIN PO) Take 1 tablet by mouth daily.    Historical Provider, MD  Multiple Vitamins-Minerals (PRESERVISION AREDS 2) CAPS Take 2  capsules by mouth daily.    Historical Provider, MD  Omega-3 Fatty Acids (FISH OIL) 1000 MG CAPS Take 2 capsules by mouth daily.    Historical Provider, MD      VITAL SIGNS:  Blood pressure 104/48, pulse 85, temperature 103.3 F (39.6 C), temperature source Oral, resp. rate 23, height 5\' 2"  (1.575 m), weight 53.7 kg (118 lb 6.2 oz), SpO2 97 %.  PHYSICAL EXAMINATION:  GENERAL:  81 y.o.-year-old patient lying in the bed with no acute distress. Hard of hearing EYES: Pupils equal, round, reactive to light and accommodation. No scleral icterus. Extraocular muscles intact.  HEENT: Head atraumatic, normocephalic. Oropharynx and nasopharynx clear.  NECK:  Supple, no jugular venous distention. No thyroid enlargement, no tenderness.  LUNGS: Normal breath sounds bilaterally, no wheezing, rales,rhonchi or crepitation. No use of accessory muscles of respiration.  CARDIOVASCULAR: S1, S2 normal. No murmurs, rubs, or gallops.  ABDOMEN: Soft, nontender, nondistended. Bowel sounds present. No organomegaly or mass.  EXTREMITIES: No pedal edema, cyanosis, or clubbing.  NEUROLOGIC: Cranial nerves II through XII are grossly intact. No focal deficits. PSYCHIATRIC: The patient is alert and oriented x 3.  SKIN: No obvious rash, lesion, or ulcer.   LABORATORY PANEL:   CBC  Recent Labs Lab 01/10/15 1818  WBC 17.6*  HGB 11.5*  HCT 34.2*  PLT 69*   ------------------------------------------------------------------------------------------------------------------  Chemistries   Recent Labs Lab 01/10/15 1818  NA 126*  K 3.5  CL 95*  CO2 18*  GLUCOSE 148*  BUN 11  CREATININE 1.03*  CALCIUM 8.6*  AST 33  ALT 14  ALKPHOS 74  BILITOT 1.1   ------------------------------------------------------------------------------------------------------------------  Cardiac Enzymes  Recent Labs Lab 01/10/15 1818  TROPONINI 0.03    ------------------------------------------------------------------------------------------------------------------  RADIOLOGY:  Dg Chest Port 1 View  01/10/2015   CLINICAL DATA:  Hypoxia.  Sepsis.  Altered mental status.  EXAM: PORTABLE CHEST - 1 VIEW  COMPARISON:  None.  FINDINGS: The cardiopericardial silhouette is within normal limits for projection. Dual lead LEFT subclavian cardiac pacemaker. Tortuous thoracic aorta with atherosclerotic calcification. No airspace disease. No pleural effusions. Prominent costochondral calcifications are present bilaterally.  IMPRESSION: No acute cardiopulmonary disease.   Electronically Signed   By: Dereck Ligas M.D.   On: 01/10/2015 18:23    EKG:   Orders placed or performed during the hospital encounter of 12/23/14  . ED EKG  . ED EKG  . EKG    IMPRESSION AND PLAN:  This is a very pleasant 81 year female with a history of hypertension who presents with sepsis.  1. Sepsis: This is secondary to urinary tract infection. Blood cultures were ordered in the emergency department. Her urine culture from the end of July shows Escherichia coli sensitive to Rocephin. Patient does have an allergy to penicillin it is a rash. I will start Rocephin 1 g IV every 24 hours. Please follow up on blood and urine culture. I will also provide  IV fluids.  2. Hypotension: This is secondary to problem #1. I will provide IV fluids and place the patient and stepdown unit overnight.  3. History of hypertension: 2 to problem #2 I will hold all hypertensive medications.  4. Thrombus cytopenia: Patient does have a history of [place] opinion. I will continue to monitor platelets. Obviously we will hold Lovenox/heparin for DVT prophylaxis secondary to the dumps Etta pain.   All the records are reviewed and case discussed with ED provider. Management plans discussed with the patient and daughter at bedside and they are in agreement  CODE STATUS: DNR  CRITICAL CARE TOTAL  TIME TAKING CARE OF THIS PATIENT: 50 minutes.    Carleah Yablonski M.D on 01/10/2015 at 8:15 PM  Between 7am to 6pm - Pager - [phone] After 6pm go to [email or web address] - password [email or web address] [facility]  Office  [phone]  CC: Primary care physician; Ivor Reining, MD

## 2015-01-10 NOTE — ED Provider Notes (Signed)
[facility] Emergency Department Provider Note  Time seen: 5:58 PM  I have reviewed the triage vital signs and the nursing notes.   HISTORY  Chief Complaint Altered Mental Status    HPI [name] is a 81 y.o. female with a past medical history of hypertension, hyperlipidemia, thrombocytopenia, anemia, presents the emergency department with fast heartbeat, fever. According to the daughter she noticed that the patient was sleeping much later than normal today. She also states she has been somewhat confused today as well. She noted her to feel very warm and to have a fast heart rate when they were checking her blood pressure so she brought to the emergency department for further evaluation. Patient was recently treated for antibiotics for a urinary tract infection approximately 2 weeks ago. The daughter states the patient appeared to improve until today. Denies any cough/congestion. The patient is hypoxic in the emergency department, and has no home O2 requirement per daughter.Patient denies any complaints currently.     Past Medical History  Diagnosis Date  . HTN (hypertension)   . Syncope   . Hyperlipidemia   . Ischemic colitis   . Thrombocytopenia   . Mobitz type 2 second degree heart block   . Anemia   . Pacemaker     There are no active problems to display for this patient.   Past Surgical History  Procedure Laterality Date  . Left hip replacement       Current Outpatient Rx  Name  Route  Sig  Dispense  Refill  . amLODipine (NORVASC) 5 MG tablet   Oral   Take 5 mg by mouth daily.         Marland Kitchen aspirin EC 81 MG tablet   Oral   Take 81 mg by mouth daily.         . bimatoprost (LUMIGAN) 0.01 % SOLN   Both Eyes   Place 1 drop into both eyes daily.         . carvedilol (COREG) 12.5 MG tablet   Oral   Take 12.5 mg by mouth 2 (two) times daily.         . cephALEXin (KEFLEX) 500 MG capsule   Oral   Take 1 capsule (500 mg total) by  mouth 2 (two) times daily.   14 capsule   0   . furosemide (LASIX) 20 MG tablet   Oral   Take 20 mg by mouth daily as needed for edema.         . hydrALAZINE (APRESOLINE) 10 MG tablet   Oral   Take 10 mg by mouth 3 (three) times daily.         Marland Kitchen HYDROcodone-acetaminophen (NORCO/VICODIN) 5-325 MG per tablet   Oral   Take 1 tablet by mouth 4 (four) times daily as needed for moderate pain.         Marland Kitchen lidocaine (ASPERCREME W/LIDOCAINE) 4 % cream   Topical   Apply 1 application topically as needed.         Marland Kitchen losartan (COZAAR) 100 MG tablet   Oral   Take 50 mg by mouth 2 (two) times daily.         . Multiple Vitamins-Minerals (MULTIVITAMIN PO)   Oral   Take 1 tablet by mouth daily.         . Multiple Vitamins-Minerals (PRESERVISION AREDS 2) CAPS   Oral   Take 2 capsules by mouth daily.         Marland Kitchen  Omega-3 Fatty Acids (FISH OIL) 1000 MG CAPS   Oral   Take 2 capsules by mouth daily.           Allergies Atenolol; Penicillins; and Dilaudid  Family History  Problem Relation Age of Onset  . Stroke Mother     Social History History  Substance Use Topics  . Smoking status: Never Smoker   . Smokeless tobacco: Never Used  . Alcohol Use: Yes    Review of Systems Constitutional: Positive for fever. Confusion per daughter. Cardiovascular: Negative for chest pain. Respiratory: Negative for shortness of breath. Negative for cough. Gastrointestinal: Negative for abdominal pain, vomiting and diarrhea. Genitourinary: Negative for dysuria. Neurological: Negative for headaches, focal weakness or numbness. 10-point ROS otherwise negative.  ____________________________________________   PHYSICAL EXAM:   Constitutional: Alert and oriented. Well appearing and in no distress. Eyes: Normal exam ENT   Head: Normocephalic and atraumatic.   Nose: No congestion/rhinnorhea.   Mouth/Throat: Dry mucous membranes Cardiovascular: Tachycardic around 120  bpm. Respiratory: Tachypnea, however no wheeze, rhonchi, rales Gastrointestinal: Soft and nontender. No distention.   Musculoskeletal: No Lotrimin tenderness or edema. Neurologic:  Normal speech and language. No gross focal neurologic deficits Skin:  Skin is warm, dry and intact.  Psychiatric: Mood and affect are normal. Speech and behavior are normal  ____________________________________________    EKG  EKG reviewed and interpreted by myself shows an atrial sensed, ventricular paced rhythm at 121 bpm,  ____________________________________________    RADIOLOGY   chest x-ray within normal limits  ____________________________________________    INITIAL IMPRESSION / ASSESSMENT AND PLAN / ED COURSE  Pertinent labs & imaging results that were available during my care of the patient were reviewed by me and considered in my medical decision making (see chart for details).  Patient meets sepsis criteria currently. Patient has a heart rate of 120 bpm, temperature of 103, is tachypneic. Patient is hypoxic 88% on room air, with no oxygen requirement at home. We'll start the patient him. Anabiotic set this time, patient does have a penicillin allergy. Suspect pneumonia versus urinary tract infection. We will check labs, and closely monitor in the emergency department. We'll obtain blood cultures prior to anabiotic administration.  Chest x-ray within normal limits. Urinalysis resulting in urinary tract infection. We will continue with IV antibiotics treating sepsis likely due to her urinary tract infection. Patient will be admitted to the hospital for further treatment.    CRITICAL CARE Performed by: Harvest Dark   Total critical care time: 60 minutes  Critical care time was exclusive of separately billable procedures and treating other patients.  Critical care was necessary to treat or prevent imminent or life-threatening deterioration.  Critical care was time spent  personally by me on the following activities: development of treatment plan with patient and/or surrogate as well as nursing, discussions with consultants, evaluation of patient's response to treatment, examination of patient, obtaining history from patient or surrogate, ordering and performing treatments and interventions, ordering and review of laboratory studies, ordering and review of radiographic studies, pulse oximetry and re-evaluation of patient's condition.  ____________________________________________   FINAL CLINICAL IMPRESSION(S) / ED DIAGNOSES  Sepsis Urinary tract infection   Harvest Dark, MD 01/10/15 [phone]

## 2015-01-10 NOTE — ED Notes (Signed)
Pt to ED from home via EMS c/o altered mental status since yesterday.  EMS reported CBG of 165, 400mg  Motrin given en route.  Per daughter pt stated "just not feeling right", pt dx with UTI 2 weeks ago and finished antibiotics.  Pt has pacemaker.  Pt alert and oriented to person and place, speaking in complete and coherent sentences and in NAD at this time.  Pt placed on 3L O2, does not wear O2 at home.

## 2015-01-11 LAB — BASIC METABOLIC PANEL
Anion gap: 8 (ref 5–15)
BUN: 12 mg/dL (ref 6–20)
CO2: 19 mmol/L — ABNORMAL LOW (ref 22–32)
Calcium: 7.9 mg/dL — ABNORMAL LOW (ref 8.9–10.3)
Chloride: 106 mmol/L (ref 101–111)
Creatinine, Ser: 0.89 mg/dL (ref 0.44–1.00)
GFR calc non Af Amer: 55 mL/min — ABNORMAL LOW (ref >60)
GLUCOSE: 106 mg/dL — AB (ref 65–99)
POTASSIUM: 3.4 mmol/L — AB (ref 3.5–5.1)
SODIUM: 133 mmol/L — AB (ref 135–145)

## 2015-01-11 LAB — CBC
HEMATOCRIT: 29.1 % — AB (ref 35.0–47.0)
HEMOGLOBIN: 9.8 g/dL — AB (ref 12.0–16.0)
MCH: 30.1 pg (ref 26.0–34.0)
MCHC: 33.6 g/dL (ref 32.0–36.0)
MCV: 89.7 fL (ref 80.0–100.0)
Platelets: 69 10*3/uL — ABNORMAL LOW (ref 150–440)
RBC: 3.24 MIL/uL — ABNORMAL LOW (ref 3.80–5.20)
RDW: 14 % (ref 11.5–14.5)
WBC: 18.5 10*3/uL — AB (ref 3.6–11.0)

## 2015-01-11 LAB — C DIFFICILE QUICK SCREEN W PCR REFLEX
C Diff antigen: NEGATIVE
C Diff interpretation: NEGATIVE
C Diff toxin: NEGATIVE

## 2015-01-11 MED ORDER — SODIUM CHLORIDE 0.9 % IV BOLUS (SEPSIS)
1000.0000 mL | Freq: Once | INTRAVENOUS | Status: AC
  Administered 2015-01-11: 1000 mL via INTRAVENOUS

## 2015-01-11 NOTE — Progress Notes (Signed)
   01/11/15 0917  Clinical Encounter Type  Visited With Patient  Visit Type Initial  Consult/Referral To Chaplain  Spiritual Encounters  Spiritual Needs Emotional  Stress Factors  Patient Stress Factors None identified  Chaplain rounded in the unit and offered compassionate presence and support to patient as applicable. Chaplain Lekeisha Arenas A. Zalen Sequeira Ext. [phone]

## 2015-01-11 NOTE — Progress Notes (Signed)
BP 92/41, paged prime docs.

## 2015-01-11 NOTE — Progress Notes (Signed)
[facility] [place] NAME: [name]    MR#:  [id number]  DATE OF BIRTH:  1922-12-05  SUBJECTIVE:  CHIEF COMPLAINT:   Chief Complaint  Patient presents with  . Altered Mental Status   Doing well this morning. No complaints.  REVIEW OF SYSTEMS:   Review of Systems  Constitutional: Negative for fever.  Respiratory: Negative for shortness of breath.   Cardiovascular: Negative for chest pain and palpitations.  Gastrointestinal: Negative for nausea, vomiting and abdominal pain.  Genitourinary: Negative for dysuria.    DRUG ALLERGIES:   Allergies  Allergen Reactions  . Atenolol Other (See Comments)    Complete Heart Block  . Penicillins Other (See Comments)  . Dilaudid [Hydromorphone Hcl] Rash    VITALS:  Blood pressure 147/61, pulse 71, temperature 97.9 F (36.6 C), temperature source Oral, resp. rate 20, height _0  (1.575 m), weight 54.432 kg (120 lb), SpO2 96 %.  PHYSICAL EXAMINATION:  GENERAL:  81 y.o.-year-old patient lying in the bed with no acute distress.  LUNGS: Normal breath sounds bilaterally, no wheezing, rales,rhonchi or crepitation. No use of accessory muscles of respiration.  CARDIOVASCULAR: S1, S2 normal. No murmurs, rubs, or gallops.  ABDOMEN: Soft, nontender, nondistended. Bowel sounds present. No organomegaly or mass.  EXTREMITIES: No pedal edema, cyanosis, or clubbing.  NEUROLOGIC: Cranial nerves II through XII are intact. Muscle strength 5/5 in all extremities. Sensation intact. Gait not checked.  PSYCHIATRIC: The patient is alert and oriented x 3.  SKIN: No obvious rash, lesion, or ulcer.    LABORATORY PANEL:   CBC  Recent Labs Lab 01/11/15 0428  WBC 18.5*  HGB 9.8*  HCT 29.1*  PLT 69*   ------------------------------------------------------------------------------------------------------------------  Chemistries   Recent Labs Lab 01/10/15 1818 01/11/15 0428  NA 126* 133*  K  3.5 3.4*  CL 95* 106  CO2 18* 19*  GLUCOSE 148* 106*  BUN 11 12  CREATININE 1.03* 0.89  CALCIUM 8.6* 7.9*  AST 33  --   ALT 14  --   ALKPHOS 74  --   BILITOT 1.1  --    ------------------------------------------------------------------------------------------------------------------  Cardiac Enzymes  Recent Labs Lab 01/10/15 1818  TROPONINI 0.03   ------------------------------------------------------------------------------------------------------------------  RADIOLOGY:  Dg Chest Port 1 View  01/10/2015   CLINICAL DATA:  Hypoxia.  Sepsis.  Altered mental status.  EXAM: PORTABLE CHEST - 1 VIEW  COMPARISON:  None.  FINDINGS: The cardiopericardial silhouette is within normal limits for projection. Dual lead LEFT subclavian cardiac pacemaker. Tortuous thoracic aorta with atherosclerotic calcification. No airspace disease. No pleural effusions. Prominent costochondral calcifications are present bilaterally.  IMPRESSION: No acute cardiopulmonary disease.   Electronically Signed   By: Dereck Ligas M.D.   On: 01/10/2015 18:23    EKG:   Orders placed or performed during the hospital encounter of 01/10/15  . EKG 12-Lead  . EKG 12-Lead    ASSESSMENT AND PLAN:   1) sepsis: resolved - initially met criteria with fever, tachycardia, hypotension. Now improved with fluids and abx  2) UTI and g - bacteremia - likely E. Coli as she has just had UTI with the same - continue rocephin will need 48 hrs, repeat cx  3) hypertension - continue to hold home antihypertensives  4) thrombocytopenia - stable, no heparin  5) anemia - dilutional, no bleeding noted - monitor    All the records are reviewed and case discussed with Care Management/Social Workerr. Management plans discussed with the patient, family  and they are in agreement.  CODE STATUS: full  TOTAL TIME TAKING CARE OF THIS PATIENT: 35 minutes.  Greater than 50% of time spent in care coordination and  counseling. POSSIBLE D/C IN 1-2 DAYS, DEPENDING ON CLINICAL CONDITION.   Myrtis Ser M.D on 01/11/2015 at 2:28 PM  Between 7am to 6pm - Pager - [phone]  After 6pm go to [email or web address] - password [email or web address] [facility]  Office  [phone]  CC: Primary care physician; Ivor Reining, MD

## 2015-01-11 NOTE — Progress Notes (Signed)
Dr. Volanda Napoleon notified of the aerobic bottle that was positive for gram negative rods. No new orders. Shalin Vonbargen E 11:13 AM 01/11/2015

## 2015-01-12 DIAGNOSIS — R7881 Bacteremia: Secondary | ICD-10-CM | POA: Diagnosis present

## 2015-01-12 DIAGNOSIS — N390 Urinary tract infection, site not specified: Secondary | ICD-10-CM

## 2015-01-12 HISTORY — DX: Urinary tract infection, site not specified: N39.0

## 2015-01-12 LAB — BASIC METABOLIC PANEL
ANION GAP: 8 (ref 5–15)
BUN: 7 mg/dL (ref 6–20)
CO2: 20 mmol/L — ABNORMAL LOW (ref 22–32)
Calcium: 8.1 mg/dL — ABNORMAL LOW (ref 8.9–10.3)
Chloride: 111 mmol/L (ref 101–111)
Creatinine, Ser: 0.7 mg/dL (ref 0.44–1.00)
GFR calc non Af Amer: >60 mL/min (ref >60)
Glucose, Bld: 124 mg/dL — ABNORMAL HIGH (ref 65–99)
POTASSIUM: 3.2 mmol/L — AB (ref 3.5–5.1)
SODIUM: 139 mmol/L (ref 135–145)

## 2015-01-12 LAB — CBC
HCT: 31.7 % — ABNORMAL LOW (ref 35.0–47.0)
HEMOGLOBIN: 10.8 g/dL — AB (ref 12.0–16.0)
MCH: 31.1 pg (ref 26.0–34.0)
MCHC: 34.2 g/dL (ref 32.0–36.0)
MCV: 90.7 fL (ref 80.0–100.0)
Platelets: 70 10*3/uL — ABNORMAL LOW (ref 150–440)
RBC: 3.49 MIL/uL — AB (ref 3.80–5.20)
RDW: 14.2 % (ref 11.5–14.5)
WBC: 8.9 10*3/uL (ref 3.6–11.0)

## 2015-01-12 LAB — GLUCOSE, CAPILLARY
GLUCOSE-CAPILLARY: 100 mg/dL — AB (ref 65–99)
Glucose-Capillary: 116 mg/dL — ABNORMAL HIGH (ref 65–99)

## 2015-01-12 LAB — URINE CULTURE

## 2015-01-12 MED ORDER — ACYCLOVIR 5 % EX CREA
TOPICAL_CREAM | CUTANEOUS | Status: DC
  Filled 2015-01-12: qty 5

## 2015-01-12 MED ORDER — ACYCLOVIR 5 % EX OINT
TOPICAL_OINTMENT | Freq: Two times a day (BID) | CUTANEOUS | Status: DC
Start: 2015-01-12 — End: 2015-02-07

## 2015-01-12 MED ORDER — SULFAMETHOXAZOLE-TRIMETHOPRIM 400-80 MG PO TABS: 1.0000 | ORAL_TABLET | Freq: Two times a day (BID) | ORAL | Status: DC

## 2015-01-12 MED ORDER — POTASSIUM CHLORIDE CRYS ER 20 MEQ PO TBCR
40.0000 meq | EXTENDED_RELEASE_TABLET | ORAL | Status: AC
  Administered 2015-01-12 (×2): 40 meq via ORAL
  Filled 2015-01-12 (×2): qty 2

## 2015-01-12 MED ORDER — ACYCLOVIR 5 % EX OINT
TOPICAL_OINTMENT | CUTANEOUS | Status: DC
  Administered 2015-01-12: 12:00:00 via TOPICAL
  Filled 2015-01-12: qty 30

## 2015-01-12 NOTE — Discharge Instructions (Signed)
DIET:  Cardiac diet  DISCHARGE CONDITION:  Good  ACTIVITY:  Activity as tolerated  OXYGEN:  Home Oxygen: No.   Oxygen Delivery: room air  DISCHARGE LOCATION:  home   If you experience worsening of your admission symptoms, develop shortness of breath, life threatening emergency, suicidal or homicidal thoughts you must seek medical attention immediately by calling 911 or calling your MD immediately  if symptoms less severe.  You Must read complete instructions/literature along with all the possible adverse reactions/side effects for all the Medicines you take and that have been prescribed to you. Take any new Medicines after you have completely understood and accpet all the possible adverse reactions/side effects.   Please note  You were cared for by a hospitalist during your hospital stay. If you have any questions about your discharge medications or the care you received while you were in the hospital after you are discharged, you can call the unit and asked to speak with the hospitalist on call if the hospitalist that took care of you is not available. Once you are discharged, your primary care physician will handle any further medical issues. Please note that NO REFILLS for any discharge medications will be authorized once you are discharged, as it is imperative that you return to your primary care physician (or establish a relationship with a primary care physician if you do not have one) for your aftercare needs so that they can reassess your need for medications and monitor your lab values.   Urinary Tract Infection A urinary tract infection (UTI) can occur any place along the urinary tract. The tract includes the kidneys, ureters, bladder, and urethra. A type of germ called bacteria often causes a UTI. UTIs are often helped with antibiotic medicine.  HOME CARE   If given, take antibiotics as told by your doctor. Finish them even if you start to feel better.  Drink enough  fluids to keep your pee (urine) clear or pale yellow.  Avoid tea, drinks with caffeine, and bubbly (carbonated) drinks.  Pee often. Avoid holding your pee in for a long time.  Pee before and after having sex (intercourse).  Wipe from front to back after you poop (bowel movement) if you are a woman. Use each tissue only once. GET HELP RIGHT AWAY IF:   You have back pain.  You have lower belly (abdominal) pain.  You have chills.  You feel sick to your stomach (nauseous).  You throw up (vomit).  Your burning or discomfort with peeing does not go away.  You have a fever.  Your symptoms are not better in 3 days. MAKE SURE YOU:   Understand these instructions.  Will watch your condition.  Will get help right away if you are not doing well or get worse. Document Released: 11/06/2007 Document Revised: 02/12/2012 Document Reviewed: 12/19/2011 [facility] Patient Information 2015 [place], [place]. This information is not intended to replace advice given to you by your health care provider. Make sure you discuss any questions you have with your health care provider.  Sepsis Sepsis is a serious infection of your blood or tissues that affects your whole body. The infection that causes sepsis may be bacterial, viral, fungal, or parasitic. Sepsis may be life threatening. Sepsis can cause your blood pressure to drop. This may result in shock. Shock causes your central nervous system and your organs to stop working correctly.  RISK FACTORS Sepsis can happen in anyone, but it is more likely to happen in people who have weakened  immune systems. SIGNS AND SYMPTOMS  Symptoms of sepsis can include:  Fever or low body temperature (hypothermia).  Rapid breathing (hyperventilation).  Chills.  Rapid heartbeat (tachycardia).  Confusion or light-headedness.  Trouble breathing.  Urinating much less than usual.  Cool, clammy skin or red, flushed skin.  Other problems with the heart, kidneys,  or brain. DIAGNOSIS  Your health care provider will likely do tests to look for an infection, to see if the infection has spread to your blood, and to see how serious your condition is. Tests can include:  Blood tests, including cultures of your blood.  Cultures of other fluids from your body, such as:  Urine.  Pus from wounds.  Mucus coughed up from your lungs.  Urine tests other than cultures.  X-ray exams or other imaging tests. TREATMENT  Treatment will begin with elimination of the source of infection. If your sepsis is likely caused by a bacterial or fungal infection, you will be given antibiotic or antifungal medicines. You may also receive:  Oxygen.  Fluids through an IV tube.  Medicines to increase your blood pressure.  A machine to clean your blood (dialysis) if your kidneys fail.  A machine to help you breathe if your lungs fail. SEEK IMMEDIATE MEDICAL CARE IF: You get an infection or develop any of the signs and symptoms of sepsis after surgery or a hospitalization. Document Released: 02/16/2003 Document Revised: 05/25/2013 Document Reviewed: 01/25/2013 [facility] Patient Information 2015 [place], [place]. This information is not intended to replace advice given to you by your health care provider. Make sure you discuss any questions you have with your health care provider.

## 2015-01-12 NOTE — Plan of Care (Signed)
Problem: Discharge Progression Outcomes Goal: Discharge plan in place and appropriate Outcome: Progressing Individualization of Care Pt prefers to be called [name] Hx of HTN, syncope, HLD, ischemic colitis, thrombocytopenia, mobitz type 2 second degree heart block, pacemaker, and anemia.  Admitted for UTI and sepsis.    Goal: Other Discharge Outcomes/Goals 1.  D/C Plan:  Sepsis resolved.  UTI and gram (-) bacteremia - plan to administer rocephin for 48 hours and then repeat culture. 2.  Pain:  Patient without complaints of pain this shift. 3.  Hemodynamically:  Patient BP ran low during day shift.  Bolus given and HTN medications on hold currently.  BP 163/67 mmHg  Pulse 96  Temp(Src) 99.1 F (37.3 C) (Oral)  Resp 20  Ht 5\' 2"  (1.575 m)  Wt 120 lb (54.432 kg)  BMI 21.94 kg/m2  SpO2 96% 4.  Complications:  Patent transferred from CCU, currently C. Dif (-).  Forgetful and will get out of bed without calling.  Patient in room near nurse's station for safety. 5.  Activity:  Patient has generalized weakness and slightly unsteady gait.  High fall risk.  In bed this shift.

## 2015-01-12 NOTE — Evaluation (Signed)
Physical Therapy Evaluation Patient Details Name: [name] MRN: [id number] DOB: [birth year] Today's Date: 01/12/2015   History of Present Illness  Pt admitted to hospital with tachycardia fever, diagnosed with Sepsis secondary to UTI infection    Clinical Impression  Pt was happy to complete therapy session and reports that she wants to go home and see her "cats as soon as possible." Pt's bed mobility was mod I and standing transfer was CGA using a RW for safety purposes, pt later completed 5x sit to stand in 17 sec with no assistive device (CGA for safety purposes).  Pt was able to ambulate for 290ft with CGA from RW and reported feeling safer with RW than with no device, reports min tiredness after ambulation but HR and O2 remained relatively stable after ambulation (see vitals).  Pt would benefit from skilled PT in order to improve with standing balance and increase ambulation distance.     Follow Up Recommendations Outpatient PT    Equipment Recommendations  Rolling walker with 5" wheels    Recommendations for Other Services       Precautions / Restrictions Precautions Precautions: Fall Restrictions Weight Bearing Restrictions: No      Mobility  Bed Mobility Overal bed mobility: Modified Independent             General bed mobility comments: Pt required a lot of attempts to scoot towards the bed, but eventually was able to sit at the EOB with handrail assist.  Transfers Overall transfer level: Needs assistance Equipment used: Rolling walker (2 wheeled) Transfers: Sit to/from Stand Sit to Stand: Min guard (For safety purposes. )         General transfer comment: Pt was able to transfer with RW and without RW (as seen from 5x sit to stand), but reports feeling safer with RW, CGA for safety purposes.   Ambulation/Gait Ambulation/Gait assistance: Min guard Ambulation Distance (Feet): 200 Feet Assistive device: Rolling walker (2 wheeled) Gait Pattern/deviations:  WFL(Within Functional Limits)   Gait velocity interpretation: <1.8 ft/sec, indicative of risk for recurrent falls General Gait Details: Pt reports walking her normal speed/gait pattern with RW.  PT suggested ambulating with RW but pt preferred not to secondary to FOF so PT was okay with that.   Stairs            Wheelchair Mobility    Modified Rankin (Stroke Patients Only)       Balance Overall balance assessment: Needs assistance Sitting-balance support: Single extremity supported;No upper extremity supported Sitting balance-Leahy Scale: Good Sitting balance - Comments: Pt was able to maintain trunk ext with no feet support/handrail support and was able to complete UE/LE MMT while sitting.    Standing balance support: Bilateral upper extremity supported;Single extremity supported Standing balance-Leahy Scale: Good Standing balance comment: Pt was able to walk without RW with no LOBs for 21ft but didn't feel as safe so she used a RW. Pt was able to complete 5x sit to stand in 18 sec with assist from the arms of the recliner chair.                              Pertinent Vitals/Pain Pain Assessment: No/denies pain    Home Living Family/patient expects to be discharged to:: Private residence Living Arrangements: Children (Daughter) Available Help at Discharge: Family Type of Home: House Home Access: Stairs to enter Entrance Stairs-Rails: None Entrance Stairs-Number of Steps: 1 Home Layout: One level Home  Equipment: Gilford Rile - 2 wheels;Cane - single point      Prior Function Level of Independence: Independent with assistive device(s)         Comments: Pt reports using a SPC but wants to transition to a RW per MD suggestion.      Hand Dominance        Extremity/Trunk Assessment   Upper Extremity Assessment: Overall WFL for tasks assessed           Lower Extremity Assessment: Overall WFL for tasks assessed      Cervical / Trunk Assessment:  Normal  Communication   Communication: No difficulties  Cognition Arousal/Alertness: Awake/alert Behavior During Therapy: WFL for tasks assessed/performed Overall Cognitive Status: Within Functional Limits for tasks assessed                      General Comments      Exercises        Assessment/Plan    PT Assessment Patient needs continued PT services  PT Diagnosis Difficulty walking;Abnormality of gait (Prior level of function was with SPC/RW so difficult to determine if gait is abnormal or not.)   PT Problem List Decreased balance;Decreased strength  PT Treatment Interventions DME instruction;Therapeutic activities;Therapeutic exercise;Gait training;Functional mobility training;Stair training;Balance training   PT Goals (Current goals can be found in the Care Plan section) Acute Rehab PT Goals Patient Stated Goal: to go home and take care of her cat PT Goal Formulation: With patient Time For Goal Achievement: 01/26/15 Potential to Achieve Goals: Good    Frequency Min 2X/week   Barriers to discharge Inaccessible home environment 1 step    Co-evaluation               End of Session Equipment Utilized During Treatment: Gait belt Activity Tolerance: Patient tolerated treatment well Patient left: in bed;with call bell/phone within reach;with bed alarm set Nurse Communication: Mobility status         Time: 2197-5883 PT Time Calculation (min) (ACUTE ONLY): 31 min   Charges:         PT G CodesBernestine Amass, SPT 01/12/2015 2:31 PM

## 2015-01-12 NOTE — Care Management Important Message (Signed)
Important Message  Patient Details  Name: [name] MRN: [id number] Date of Birth: [birth year]   Medicare Important Message Given:  Yes-second notification given    Juliann Pulse A Allmond 01/12/2015, 10:39 AM

## 2015-01-12 NOTE — Care Management (Addendum)
Admitted to [facility] with the diagnosis of urinary tract infection. Lives with daughter, Tomi Bamberger [phone]). Sees Dr. Ivor Reining in [place]. Last seen 3 weeks ago. States she will be transferring her physician services to a local physician because she lives with her daughter now.  No skilled facility per Ms. Dinse. States she has had therapy in the past. Uses a rolling walker in the home. Good appetite. No falls. Takes care of all activities of daily living herself.  Daughter will transport. Physical therapy in progress. Recommends outpatient therapy. Shelbie Ammons RN MSN Care Management [phone]

## 2015-01-13 LAB — CULTURE, BLOOD (ROUTINE X 2)

## 2015-01-14 NOTE — Discharge Summary (Signed)
[facility] [place] NAME: [name]    MR#:  [id number]  DATE OF BIRTH:  Aug 22, 1922  DATE OF ADMISSION:  01/10/2015 ADMITTING PHYSICIAN: Bettey Costa, MD  DATE OF DISCHARGE: 01/12/2015  PRIMARY CARE PHYSICIAN: Ivor Reining, MD    ADMISSION DIAGNOSIS:  UTI (lower urinary tract infection) [N39.0] Sepsis, due to unspecified organism [A41.9]  DISCHARGE DIAGNOSIS:  Active Problems:   Sepsis   Bacteremia   UTI (lower urinary tract infection)   SECONDARY DIAGNOSIS:   Past Medical History  Diagnosis Date  . HTN (hypertension)   . Syncope   . Hyperlipidemia   . Ischemic colitis   . Thrombocytopenia   . Mobitz type 2 second degree heart block   . Anemia   . Pacemaker     HOSPITAL COURSE:    1) sepsis: Initially met criteria with fever, tachycardia, hypotension. Now improved with fluids and antibiotic therapy.  2) UTI and g - bacteremia: She has had a recent UTI treated by emergency physician with Keflex. Looking at culture data, this should have been effective.  She now has recurrence of E. Coli UTI and bacteremia, again sensitive. She has had 48 hours of IV rocephin with clinical  Improvement. Will start PO bactrim and discharge on the same. Repeat blood cultures are negative.  3) hypertension: During most of the hospitalization blood pressures were low, so medciations held. Now back to nomo/hypertension. Resume home medications.  4) thrombocytopenia: Stable, no bleeding noted. 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  DISCHARGE CONDITIONS:   stble  CONSULTS OBTAINED:    none  DRUG ALLERGIES:   Allergies  Allergen Reactions  . Atenolol Other (See Comments)    Complete Heart Block  . Penicillins Other (See Comments)  . Dilaudid [Hydromorphone Hcl] Rash    DISCHARGE MEDICATIONS:   Discharge Medication List as of 01/12/2015 12:23 PM    START taking these  medications   Details  acyclovir ointment (ZOVIRAX) 5 % Apply topically 2 (two) times daily after a meal. Put on lip lesions twice a day for one week., Starting 01/12/2015, Until Discontinued, Print    sulfamethoxazole-trimethoprim (BACTRIM) 400-80 MG per tablet Take 1 tablet by mouth 2 (two) times daily., Starting 01/12/2015, Until Discontinued, Print      CONTINUE these medications which have NOT CHANGED   Details  amLODipine (NORVASC) 5 MG tablet Take 5 mg by mouth at bedtime. , Until Discontinued, Historical Med    aspirin EC 81 MG tablet Take 81 mg by mouth daily., Until Discontinued, Historical Med    bimatoprost (LUMIGAN) 0.01 % SOLN Place 1 drop into both eyes daily., Until Discontinued, Historical Med    carvedilol (COREG) 12.5 MG tablet Take 12.5 mg by mouth 2 (two) times daily., Until Discontinued, Historical Med    furosemide (LASIX) 20 MG tablet Take 20 mg by mouth daily as needed for edema., Until Discontinued, Historical Med    hydrALAZINE (APRESOLINE) 10 MG tablet Take 10 mg by mouth 3 (three) times daily., Until Discontinued, Historical Med    HYDROcodone-acetaminophen (NORCO/VICODIN) 5-325 MG per tablet Take 1 tablet by mouth 4 (four) times daily as needed for moderate pain., Until Discontinued, Historical Med    lidocaine (ASPERCREME W/LIDOCAINE) 4 % cream Apply 1 application topically as needed., Until Discontinued, Historical Med    losartan (COZAAR) 100 MG tablet Take 50 mg by mouth 2 (two) times daily., Until Discontinued, Historical Med  Multiple Vitamins-Minerals (MULTIVITAMIN PO) Take 1 tablet by mouth daily., Until Discontinued, Historical Med    Multiple Vitamins-Minerals (PRESERVISION AREDS 2) CAPS Take 2 capsules by mouth daily., Until Discontinued, Historical Med    Omega-3 Fatty Acids (FISH OIL) 1000 MG CAPS Take 2 capsules by mouth daily., Until Discontinued, Historical Med         DISCHARGE INSTRUCTIONS:   See AVS  Today   CHIEF COMPLAINT:    Chief Complaint  Patient presents with  . Altered Mental Status    HISTORY OF PRESENT ILLNESS:  [name] is a 81 y.o. female with a known history of hypertension and pacemaker who presents with above complaint. Patient was seen in emergency room proxy 10 days ago and diagnosed with the ureteric infection and was sent home on antibiotics for 10 days. She presents today with lethargy, sluggishness and confusion. It is noted that she has urinary tract infection. Her urine culture from approximately a week ago showed Escherichia coli sensitive to Rocephin however resistant to ciprofloxacin. In the emergency room she was noted have a fever and tachycardia. She was treated with vancomycin, AZTREONAM and Levaquin. She is received 2 L of normal saline bolus her blood pressure remains low normal.  VITAL SIGNS:  Blood pressure 177/87, pulse 106, temperature 98.5 F (36.9 C), temperature source Oral, resp. rate 20, height 5\' 2"  (1.575 m), weight 54.432 kg (120 lb), SpO2 96 %.  I/O:  No intake or output data in the 24 hours ending 01/14/15 1408  PHYSICAL EXAMINATION:  GENERAL:  81 y.o.-year-old patient lying in the bed with no acute distress.  EYES: Pupils equal, round, reactive to light and accommodation. No scleral icterus. Extraocular muscles intact.  HEENT: Head atraumatic, normocephalic. Oropharynx and nasopharynx clear.  NECK:  Supple, no jugular venous distention. No thyroid enlargement, no tenderness.  LUNGS: Normal breath sounds bilaterally, no wheezing, rales,rhonchi or crepitation. No use of accessory muscles of respiration.  CARDIOVASCULAR: S1, S2 normal. No murmurs, rubs, or gallops.  ABDOMEN: Soft, non-tender, non-distended. Bowel sounds present. No organomegaly or mass.  EXTREMITIES: No pedal edema, cyanosis, or clubbing.  NEUROLOGIC: Cranial nerves II through XII are intact. Muscle strength 5/5 in all extremities. Sensation intact. Gait not checked.  PSYCHIATRIC: The patient is  alert and oriented x 3.  SKIN: No obvious rash, lesion, or ulcer.   DATA REVIEW:   CBC  Recent Labs Lab 01/12/15 0532  WBC 8.9  HGB 10.8*  HCT 31.7*  PLT 70*    Chemistries   Recent Labs Lab 01/10/15 1818  01/12/15 0532  NA 126*  < > 139  K 3.5  < > 3.2*  CL 95*  < > 111  CO2 18*  < > 20*  GLUCOSE 148*  < > 124*  BUN 11  < > 7  CREATININE 1.03*  < > 0.70  CALCIUM 8.6*  < > 8.1*  AST 33  --   --   ALT 14  --   --   ALKPHOS 74  --   --   BILITOT 1.1  --   --   < > = values in this interval not displayed.  Cardiac Enzymes  Recent Labs Lab 01/10/15 1818  TROPONINI 0.03    Microbiology Results  Results for orders placed or performed during the hospital encounter of 01/10/15  Blood Culture (routine x 2)     Status: None   Collection Time: 01/10/15  6:12 PM  Result Value Ref Range Status  Specimen Description BLOOD RIGHT FOREARM  Final   Special Requests BOTTLES DRAWN AEROBIC AND ANAEROBIC 5ML  Final   Culture  Setup Time   Final    GRAM NEGATIVE RODS IN BOTH AEROBIC AND ANAEROBIC BOTTLES CRITICAL RESULT CALLED TO, READ BACK BY AND VERIFIED WITH: CHRISTINA MILES AT 1308 01/11/15 CTJ    Culture ESCHERICHIA COLI  Final   Report Status 01/13/2015 FINAL  Final   Organism ID, Bacteria ESCHERICHIA COLI  Final      Susceptibility   Escherichia coli - MIC*    AMPICILLIN >=32 RESISTANT Resistant     CEFTAZIDIME <=1 SENSITIVE Sensitive     CEFAZOLIN <=4 SENSITIVE Sensitive     CEFTRIAXONE <=1 SENSITIVE Sensitive     CIPROFLOXACIN >=4 RESISTANT Resistant     GENTAMICIN >=16 RESISTANT Resistant     IMIPENEM <=0.25 SENSITIVE Sensitive     TRIMETH/SULFA <=20 SENSITIVE Sensitive     PIP/TAZO Value in next row Sensitive      SENSITIVE<=4    AMPICILLIN/SULBACTAM Value in next row Intermediate      INTERMEDIATE16    ERTAPENEM Value in next row Sensitive      SENSITIVE<=0.5    * ESCHERICHIA COLI  Blood Culture (routine x 2)     Status: None (Preliminary result)    Collection Time: 01/10/15  6:15 PM  Result Value Ref Range Status   Specimen Description BLOOD LEFT ANTECUBITAL  Final   Special Requests BOTTLES DRAWN AEROBIC AND ANAEROBIC 5ML  Final   Culture NO GROWTH 3 DAYS  Final   Report Status PENDING  Incomplete  Urine culture     Status: None   Collection Time: 01/10/15  6:32 PM  Result Value Ref Range Status   Specimen Description URINE, CLEAN CATCH  Final   Special Requests NONE  Final   Culture >=100,000 COLONIES/mL ESCHERICHIA COLI  Final   Report Status 01/12/2015 FINAL  Final   Organism ID, Bacteria ESCHERICHIA COLI  Final      Susceptibility   Escherichia coli - MIC*    AMPICILLIN >=32 RESISTANT Resistant     CEFAZOLIN <=4 SENSITIVE Sensitive     CEFTRIAXONE <=1 SENSITIVE Sensitive     CIPROFLOXACIN >=4 RESISTANT Resistant     GENTAMICIN >=16 RESISTANT Resistant     IMIPENEM <=0.25 SENSITIVE Sensitive     NITROFURANTOIN <=16 SENSITIVE Sensitive     TRIMETH/SULFA <=20 SENSITIVE Sensitive     PIP/TAZO Value in next row Sensitive      SENSITIVE<=4    LEVOFLOXACIN Value in next row Resistant      RESISTANT>=8    * >=100,000 COLONIES/mL ESCHERICHIA COLI  MRSA PCR Screening     Status: None   Collection Time: 01/10/15  9:54 PM  Result Value Ref Range Status   MRSA by PCR NEGATIVE NEGATIVE Final    Comment:        The GeneXpert MRSA Assay (FDA approved for NASAL specimens only), is one component of a comprehensive MRSA colonization surveillance program. It is not intended to diagnose MRSA infection nor to guide or monitor treatment for MRSA infections.   C difficile quick scan w PCR reflex     Status: None   Collection Time: 01/11/15  5:22 AM  Result Value Ref Range Status   C Diff antigen NEGATIVE NEGATIVE Final   C Diff toxin NEGATIVE NEGATIVE Final   C Diff interpretation Negative for C. difficile  Final  Culture, blood (routine x 2)     Status:  None (Preliminary result)   Collection Time: 01/12/15 10:39 AM  Result  Value Ref Range Status   Specimen Description BLOOD LEFT ASSIST CONTROL  Final   Special Requests   Final    BOTTLES DRAWN AEROBIC AND ANAEROBIC  6 CC ANAEROBIC, 10 CC AEROBIC   Culture NO GROWTH < 24 HOURS  Final   Report Status PENDING  Incomplete  Culture, blood (routine x 2)     Status: None (Preliminary result)   Collection Time: 01/12/15 10:45 AM  Result Value Ref Range Status   Specimen Description BLOOD LEFT HAND  Final   Special Requests BOTTLES DRAWN AEROBIC AND ANAEROBIC  3 CC  Final   Culture NO GROWTH < 24 HOURS  Final   Report Status PENDING  Incomplete    RADIOLOGY:  No results found.  EKG:   Orders placed or performed during the hospital encounter of 01/10/15  . EKG 12-Lead  . EKG 12-Lead  . EKG      Management plans discussed with the patient, family and they are in agreement.  CODE STATUS:  Advance Directive Documentation        Most Recent Value   Type of Advance Directive  Out of facility DNR (pink MOST or yellow form)   Pre-existing out of facility DNR order (yellow form or pink MOST form)     "MOST" Form in Place?        TOTAL TIME TAKING CARE OF THIS PATIENT: 35 minutes.  Greater than 50% of time spent in care coordination and counseling.  Myrtis Ser M.D on 01/14/2015 at 2:08 PM  Between 7am to 6pm - Pager - [phone]  After 6pm go to [email or web address] - password [email or web address]  Office  [phone]  CC: Primary care physician; Ivor Reining, MD

## 2015-01-15 LAB — CULTURE, BLOOD (ROUTINE X 2): Culture: NO GROWTH

## 2015-01-17 LAB — CULTURE, BLOOD (ROUTINE X 2)
Culture: NO GROWTH
Culture: NO GROWTH

## 2015-01-19 DIAGNOSIS — R609 Edema, unspecified: Secondary | ICD-10-CM | POA: Diagnosis not present

## 2015-01-19 DIAGNOSIS — N390 Urinary tract infection, site not specified: Secondary | ICD-10-CM | POA: Diagnosis not present

## 2015-01-26 ENCOUNTER — Ambulatory Visit: Payer: Medicare Other | Admitting: Primary Care

## 2015-01-31 ENCOUNTER — Emergency Department: Payer: Medicare Other

## 2015-01-31 ENCOUNTER — Inpatient Hospital Stay
Admission: EM | Admit: 2015-01-31 | Discharge: 2015-02-07 | DRG: 871 | Disposition: A | Discharge status: Home Health | Payer: Medicare Other | Attending: Internal Medicine | Admitting: Internal Medicine

## 2015-01-31 ENCOUNTER — Other Ambulatory Visit: Payer: Self-pay

## 2015-01-31 ENCOUNTER — Encounter: Payer: Self-pay | Admitting: Emergency Medicine

## 2015-01-31 DIAGNOSIS — A419 Sepsis, unspecified organism: Secondary | ICD-10-CM

## 2015-01-31 DIAGNOSIS — M8448XA Pathological fracture, other site, initial encounter for fracture: Secondary | ICD-10-CM | POA: Diagnosis present

## 2015-01-31 DIAGNOSIS — E876 Hypokalemia: Secondary | ICD-10-CM | POA: Diagnosis not present

## 2015-01-31 DIAGNOSIS — B9620 Unspecified Escherichia coli [E. coli] as the cause of diseases classified elsewhere: Secondary | ICD-10-CM | POA: Diagnosis not present

## 2015-01-31 DIAGNOSIS — Z885 Allergy status to narcotic agent status: Secondary | ICD-10-CM

## 2015-01-31 DIAGNOSIS — Z7982 Long term (current) use of aspirin: Secondary | ICD-10-CM | POA: Diagnosis not present

## 2015-01-31 DIAGNOSIS — R42 Dizziness and giddiness: Secondary | ICD-10-CM | POA: Diagnosis present

## 2015-01-31 DIAGNOSIS — R6520 Severe sepsis without septic shock: Secondary | ICD-10-CM | POA: Diagnosis not present

## 2015-01-31 DIAGNOSIS — I10 Essential (primary) hypertension: Secondary | ICD-10-CM | POA: Diagnosis not present

## 2015-01-31 DIAGNOSIS — N390 Urinary tract infection, site not specified: Secondary | ICD-10-CM | POA: Diagnosis not present

## 2015-01-31 DIAGNOSIS — Z880 Allergy status to penicillin: Secondary | ICD-10-CM

## 2015-01-31 DIAGNOSIS — Z96642 Presence of left artificial hip joint: Secondary | ICD-10-CM | POA: Diagnosis present

## 2015-01-31 DIAGNOSIS — E871 Hypo-osmolality and hyponatremia: Secondary | ICD-10-CM | POA: Diagnosis present

## 2015-01-31 DIAGNOSIS — E785 Hyperlipidemia, unspecified: Secondary | ICD-10-CM | POA: Diagnosis present

## 2015-01-31 DIAGNOSIS — Z950 Presence of cardiac pacemaker: Secondary | ICD-10-CM | POA: Diagnosis not present

## 2015-01-31 DIAGNOSIS — R079 Chest pain, unspecified: Secondary | ICD-10-CM | POA: Diagnosis not present

## 2015-01-31 DIAGNOSIS — N10 Acute tubulo-interstitial nephritis: Secondary | ICD-10-CM | POA: Diagnosis not present

## 2015-01-31 DIAGNOSIS — D6959 Other secondary thrombocytopenia: Secondary | ICD-10-CM | POA: Diagnosis present

## 2015-01-31 DIAGNOSIS — Z888 Allergy status to other drugs, medicaments and biological substances status: Secondary | ICD-10-CM

## 2015-01-31 DIAGNOSIS — M549 Dorsalgia, unspecified: Secondary | ICD-10-CM

## 2015-01-31 DIAGNOSIS — Z66 Do not resuscitate: Secondary | ICD-10-CM | POA: Diagnosis present

## 2015-01-31 DIAGNOSIS — R4182 Altered mental status, unspecified: Secondary | ICD-10-CM | POA: Diagnosis not present

## 2015-01-31 DIAGNOSIS — A4151 Sepsis due to Escherichia coli [E. coli]: Secondary | ICD-10-CM | POA: Diagnosis not present

## 2015-01-31 DIAGNOSIS — D7389 Other diseases of spleen: Secondary | ICD-10-CM | POA: Diagnosis not present

## 2015-01-31 DIAGNOSIS — M4854XA Collapsed vertebra, not elsewhere classified, thoracic region, initial encounter for fracture: Secondary | ICD-10-CM | POA: Diagnosis not present

## 2015-01-31 DIAGNOSIS — D696 Thrombocytopenia, unspecified: Secondary | ICD-10-CM | POA: Diagnosis not present

## 2015-01-31 DIAGNOSIS — J9601 Acute respiratory failure with hypoxia: Secondary | ICD-10-CM | POA: Diagnosis present

## 2015-01-31 DIAGNOSIS — R339 Retention of urine, unspecified: Secondary | ICD-10-CM | POA: Diagnosis not present

## 2015-01-31 DIAGNOSIS — A499 Bacterial infection, unspecified: Secondary | ICD-10-CM | POA: Diagnosis not present

## 2015-01-31 DIAGNOSIS — R509 Fever, unspecified: Secondary | ICD-10-CM | POA: Diagnosis not present

## 2015-01-31 DIAGNOSIS — N136 Pyonephrosis: Secondary | ICD-10-CM | POA: Diagnosis present

## 2015-01-31 DIAGNOSIS — N1330 Unspecified hydronephrosis: Secondary | ICD-10-CM | POA: Diagnosis not present

## 2015-01-31 DIAGNOSIS — N3090 Cystitis, unspecified without hematuria: Secondary | ICD-10-CM

## 2015-01-31 DIAGNOSIS — Z87891 Personal history of nicotine dependence: Secondary | ICD-10-CM | POA: Diagnosis not present

## 2015-01-31 DIAGNOSIS — M6281 Muscle weakness (generalized): Secondary | ICD-10-CM | POA: Diagnosis not present

## 2015-01-31 DIAGNOSIS — D72829 Elevated white blood cell count, unspecified: Secondary | ICD-10-CM | POA: Diagnosis not present

## 2015-01-31 LAB — CBC WITH DIFFERENTIAL/PLATELET
BASOS ABS: 0 10*3/uL (ref 0–0.1)
BASOS PCT: 0 %
Eosinophils Absolute: 0 10*3/uL (ref 0–0.7)
Eosinophils Relative: 0 %
HCT: 34.8 % — ABNORMAL LOW (ref 35.0–47.0)
HEMOGLOBIN: 11.6 g/dL — AB (ref 12.0–16.0)
Lymphocytes Relative: 3 %
Lymphs Abs: 0.6 10*3/uL — ABNORMAL LOW (ref 1.0–3.6)
MCH: 29.7 pg (ref 26.0–34.0)
MCHC: 33.2 g/dL (ref 32.0–36.0)
MCV: 89.5 fL (ref 80.0–100.0)
MONOS PCT: 16 %
Monocytes Absolute: 3.1 10*3/uL — ABNORMAL HIGH (ref 0.2–0.9)
NEUTROS ABS: 15.5 10*3/uL — AB (ref 1.4–6.5)
Neutrophils Relative %: 81 %
Platelets: 62 10*3/uL — ABNORMAL LOW (ref 150–440)
RBC: 3.89 MIL/uL (ref 3.80–5.20)
RDW: 14.6 % — ABNORMAL HIGH (ref 11.5–14.5)
WBC: 19.2 10*3/uL — ABNORMAL HIGH (ref 3.6–11.0)

## 2015-01-31 LAB — COMPREHENSIVE METABOLIC PANEL
ALT: 15 U/L (ref 14–54)
AST: 23 U/L (ref 15–41)
Albumin: 3.4 g/dL — ABNORMAL LOW (ref 3.5–5.0)
Alkaline Phosphatase: 72 U/L (ref 38–126)
Anion gap: 13 (ref 5–15)
BUN: 12 mg/dL (ref 6–20)
CO2: 19 mmol/L — AB (ref 22–32)
CREATININE: 1.13 mg/dL — AB (ref 0.44–1.00)
Calcium: 8.8 mg/dL — ABNORMAL LOW (ref 8.9–10.3)
Chloride: 97 mmol/L — ABNORMAL LOW (ref 101–111)
GFR calc Af Amer: 48 mL/min — ABNORMAL LOW (ref >60)
GFR calc non Af Amer: 41 mL/min — ABNORMAL LOW (ref >60)
Glucose, Bld: 145 mg/dL — ABNORMAL HIGH (ref 65–99)
POTASSIUM: 3.8 mmol/L (ref 3.5–5.1)
SODIUM: 129 mmol/L — AB (ref 135–145)
Total Bilirubin: 1.2 mg/dL (ref 0.3–1.2)
Total Protein: 6.9 g/dL (ref 6.5–8.1)

## 2015-01-31 LAB — CBC
HCT: 34.7 % — ABNORMAL LOW (ref 35.0–47.0)
Hemoglobin: 11.1 g/dL — ABNORMAL LOW (ref 12.0–16.0)
MCH: 29.3 pg (ref 26.0–34.0)
MCHC: 32 g/dL (ref 32.0–36.0)
MCV: 91.4 fL (ref 80.0–100.0)
PLATELETS: 58 10*3/uL — AB (ref 150–440)
RBC: 3.8 MIL/uL (ref 3.80–5.20)
RDW: 15.2 % — ABNORMAL HIGH (ref 11.5–14.5)
WBC: 24.8 10*3/uL — ABNORMAL HIGH (ref 3.6–11.0)

## 2015-01-31 LAB — PROTIME-INR
INR: 1.33
PROTHROMBIN TIME: 16.7 s — AB (ref 11.4–15.0)

## 2015-01-31 LAB — URINALYSIS COMPLETE WITH MICROSCOPIC (ARMC ONLY)
BILIRUBIN URINE: NEGATIVE
GLUCOSE, UA: NEGATIVE mg/dL
KETONES UR: NEGATIVE mg/dL
NITRITE: NEGATIVE
PH: 6 (ref 5.0–8.0)
Protein, ur: 30 mg/dL — AB
SPECIFIC GRAVITY, URINE: 1.008 (ref 1.005–1.030)

## 2015-01-31 LAB — TROPONIN I

## 2015-01-31 LAB — APTT: APTT: 43 s — AB (ref 24–36)

## 2015-01-31 LAB — CREATININE, SERUM
CREATININE: 1.27 mg/dL — AB (ref 0.44–1.00)
GFR calc Af Amer: 42 mL/min — ABNORMAL LOW (ref >60)
GFR, EST NON AFRICAN AMERICAN: 36 mL/min — AB (ref >60)

## 2015-01-31 LAB — LIPASE, BLOOD: Lipase: 26 U/L (ref 22–51)

## 2015-01-31 LAB — LACTIC ACID, PLASMA: Lactic Acid, Venous: 1 mmol/L (ref 0.5–2.0)

## 2015-01-31 MED ORDER — DEXTROSE 5 % IV SOLN
2.0000 g | Freq: Once | INTRAVENOUS | Status: AC
  Administered 2015-01-31: 2 g via INTRAVENOUS
  Filled 2015-01-31: qty 2

## 2015-01-31 MED ORDER — AMLODIPINE BESYLATE 5 MG PO TABS
5.0000 mg | ORAL_TABLET | Freq: Every day | ORAL | Status: DC
  Administered 2015-01-31 – 2015-02-07 (×7): 5 mg via ORAL
  Filled 2015-01-31 (×8): qty 1

## 2015-01-31 MED ORDER — CEFTRIAXONE SODIUM 1 G IJ SOLR: 1.0000 g | INTRAMUSCULAR | Status: DC

## 2015-01-31 MED ORDER — DEXTROSE 5 % IV SOLN
INTRAVENOUS | Status: AC
  Filled 2015-01-31: qty 10

## 2015-01-31 MED ORDER — HYDROCODONE-ACETAMINOPHEN 5-325 MG PO TABS
1.0000 | ORAL_TABLET | Freq: Four times a day (QID) | ORAL | Status: DC | PRN
  Administered 2015-02-01: 1 via ORAL
  Administered 2015-02-02: 0.5 via ORAL
  Filled 2015-01-31 (×2): qty 1

## 2015-01-31 MED ORDER — SODIUM CHLORIDE 0.9 % IV BOLUS (SEPSIS)
1000.0000 mL | INTRAVENOUS | Status: AC
  Administered 2015-01-31 (×2): 1000 mL via INTRAVENOUS

## 2015-01-31 MED ORDER — DEXTROSE 5 % IV SOLN
1.0000 g | INTRAVENOUS | Status: DC
  Administered 2015-02-01: 1 g via INTRAVENOUS
  Filled 2015-01-31 (×2): qty 10

## 2015-01-31 MED ORDER — ASPIRIN EC 81 MG PO TBEC
81.0000 mg | DELAYED_RELEASE_TABLET | Freq: Every day | ORAL | Status: DC
  Administered 2015-01-31 – 2015-02-02 (×3): 81 mg via ORAL
  Filled 2015-01-31 (×3): qty 1

## 2015-01-31 MED ORDER — SODIUM CHLORIDE 0.9 % IV SOLN
INTRAVENOUS | Status: AC
  Administered 2015-01-31: 17:00:00 via INTRAVENOUS

## 2015-01-31 MED ORDER — ACETAMINOPHEN 325 MG PO TABS
650.0000 mg | ORAL_TABLET | Freq: Once | ORAL | Status: AC
  Administered 2015-01-31: 650 mg via ORAL

## 2015-01-31 MED ORDER — DEXTROSE 5 % IV SOLN: 1.0000 g | INTRAVENOUS | Status: DC

## 2015-01-31 MED ORDER — ACETAMINOPHEN 325 MG PO TABS
ORAL_TABLET | ORAL | Status: AC
  Administered 2015-01-31: 650 mg via ORAL
  Filled 2015-01-31: qty 2

## 2015-01-31 MED ORDER — OCUVITE-LUTEIN PO CAPS
1.0000 | ORAL_CAPSULE | Freq: Every day | ORAL | Status: DC
Start: 2015-01-31 — End: 2015-02-07
  Administered 2015-01-31 – 2015-02-07 (×8): 1 via ORAL
  Filled 2015-01-31 (×8): qty 1

## 2015-01-31 MED ORDER — HEPARIN SODIUM (PORCINE) 5000 UNIT/ML IJ SOLN
5000.0000 [IU] | Freq: Three times a day (TID) | INTRAMUSCULAR | Status: DC
  Administered 2015-01-31 – 2015-02-01 (×2): 5000 [IU] via SUBCUTANEOUS
  Filled 2015-01-31 (×2): qty 1

## 2015-01-31 NOTE — ED Notes (Signed)
Assisted patient onto bedpan.  Skin care given.

## 2015-01-31 NOTE — ED Provider Notes (Signed)
[facility] Emergency Department Provider Note  ____________________________________________  Time seen: 12:00 PM on arrival by EMS  I have reviewed the triage vital signs and the nursing notes.   HISTORY  Chief Complaint Weakness    HPI [name] is a 81 y.o. female who complains of generalized weakness since yesterday. She was treated with antibiotics recently and finished her course about 2 days ago. She denies any nausea or vomiting or chest pain. She does have shortness of breath which is new. Denies falls syncope or head injury.  No diarrhea   Past Medical History  Diagnosis Date  . HTN (hypertension)   . Syncope   . Hyperlipidemia   . Ischemic colitis   . Thrombocytopenia   . Mobitz type 2 second degree heart block   . Anemia   . Pacemaker      Patient Active Problem List   Diagnosis Date Noted  . Bacteremia 01/12/2015  . UTI (lower urinary tract infection) 01/12/2015  . Sepsis 01/10/2015     Past Surgical History  Procedure Laterality Date  . Left hip replacement     . Cholecystectomy       Current Outpatient Rx  Name  Route  Sig  Dispense  Refill  . acyclovir ointment (ZOVIRAX) 5 %   Topical   Apply topically 2 (two) times daily after a meal. Put on lip lesions twice a day for one week.   5 g   0   . amLODipine (NORVASC) 5 MG tablet   Oral   Take 5 mg by mouth at bedtime.          Marland Kitchen aspirin EC 81 MG tablet   Oral   Take 81 mg by mouth daily.         . bimatoprost (LUMIGAN) 0.01 % SOLN   Both Eyes   Place 1 drop into both eyes daily.         . carvedilol (COREG) 12.5 MG tablet   Oral   Take 12.5 mg by mouth 2 (two) times daily.         . furosemide (LASIX) 20 MG tablet   Oral   Take 20 mg by mouth daily as needed for edema.         . hydrALAZINE (APRESOLINE) 10 MG tablet   Oral   Take 10 mg by mouth 3 (three) times daily.         Marland Kitchen HYDROcodone-acetaminophen (NORCO/VICODIN) 5-325 MG per  tablet   Oral   Take 1 tablet by mouth 4 (four) times daily as needed for moderate pain.         Marland Kitchen lidocaine (ASPERCREME W/LIDOCAINE) 4 % cream   Topical   Apply 1 application topically as needed.         Marland Kitchen losartan (COZAAR) 100 MG tablet   Oral   Take 50 mg by mouth 2 (two) times daily.         . Multiple Vitamins-Minerals (MULTIVITAMIN PO)   Oral   Take 1 tablet by mouth daily.         . Multiple Vitamins-Minerals (PRESERVISION AREDS 2) CAPS   Oral   Take 2 capsules by mouth daily.         . Omega-3 Fatty Acids (FISH OIL) 1000 MG CAPS   Oral   Take 2 capsules by mouth daily.         Marland Kitchen sulfamethoxazole-trimethoprim (BACTRIM) 400-80 MG per tablet   Oral   Take 1  tablet by mouth 2 (two) times daily.   20 tablet   0      Allergies Atenolol; Penicillins; and Dilaudid   Family History  Problem Relation Age of Onset  . Stroke Mother     Social History Social History  Substance Use Topics  . Smoking status: Former Research scientist (life sciences)  . Smokeless tobacco: Never Used  . Alcohol Use: No    Review of Systems  Constitutional:   No fever or chills. No weight changes Eyes:   No blurry vision or double vision.  ENT:   No sore throat. Cardiovascular:   No chest pain. Respiratory:   Positive dyspnea, no cough. Gastrointestinal:   Negative for abdominal pain, vomiting and diarrhea.  No BRBPR or melena. Genitourinary:   Negative for dysuria, urinary retention, bloody urine, or difficulty urinating. Musculoskeletal:   Negative for back pain. No joint swelling or pain. Skin:   Negative for rash. Neurological:   Negative for headaches, focal weakness or numbness. Psychiatric:  No anxiety or depression.   Endocrine:  No hot/cold intolerance, changes in energy, or sleep difficulty.  10-point ROS otherwise negative.  ____________________________________________   PHYSICAL EXAM:  VITAL SIGNS: ED Triage Vitals  Enc Vitals Group     BP 01/31/15 1241 145/71 mmHg      Pulse Rate 01/31/15 1241 113     Resp 01/31/15 1241 26     Temp 01/31/15 1241 103.1 F (39.5 C)     Temp Source 01/31/15 1241 Oral     SpO2 01/31/15 1241 87 %     Weight 01/31/15 1241 119 lb (53.978 kg)     Height 01/31/15 1241 5\' 1"  (1.549 m)     Head Cir --      Peak Flow --      Pain Score 01/31/15 1242 0     Pain Loc --      Pain Edu? --      Excl. in [place]? --    Examined with nurse at bedside  Constitutional:   Alert and oriented. Moderate distress.  Eyes:   No scleral icterus. No conjunctival pallor. PERRL. EOMI ENT   Head:   Normocephalic and atraumatic.   Nose:   No congestion/rhinnorhea. No septal hematoma   Mouth/Throat:   Dry mucous membranes, no pharyngeal erythema. No peritonsillar mass. No uvula shift.   Neck:   No stridor. No SubQ emphysema. No meningismus. Hematological/Lymphatic/Immunilogical:   No cervical lymphadenopathy. Cardiovascular:   Tachycardia, heart rate 110-120. Normal and symmetric distal pulses are present in all extremities. No murmurs, rubs, or gallops. Respiratory:   Normal respiratory effort without tachypnea nor retractions. Breath sounds are clear and equal bilaterally. No wheezes/rales/rhonchi. Gastrointestinal:   Suprapubic tendernessr. No distention. There is no CVA tenderness.  No rebound, rigidity, or guarding. Genitourinary:   Normal external genitalia Musculoskeletal:   Nontender with normal range of motion in all extremities. No joint effusions.  No lower extremity tenderness.  No edema. Neurologic:   Normal speech and language.  CN 2-10 normal. Motor grossly intact. No pronator drift.  Normal gait. No gross focal neurologic deficits are appreciated.  Skin:    Skin is warm, dry and intact. No rash noted.  No petechiae, purpura, or bullae. Psychiatric:   Mood and affect are normal. Speech and behavior are normal. Patient exhibits appropriate insight and judgment.  ____________________________________________    LABS  (pertinent positives/negatives) (all labs ordered are listed, but only abnormal results are displayed) Labs Reviewed  COMPREHENSIVE METABOLIC PANEL -  Abnormal; Notable for the following:    Sodium 129 (*)    Chloride 97 (*)    CO2 19 (*)    Glucose, Bld 145 (*)    Creatinine, Ser 1.13 (*)    Calcium 8.8 (*)    Albumin 3.4 (*)    GFR calc non Af Amer 41 (*)    GFR calc Af Amer 48 (*)    All other components within normal limits  CBC WITH DIFFERENTIAL/PLATELET - Abnormal; Notable for the following:    WBC 19.2 (*)    Hemoglobin 11.6 (*)    HCT 34.8 (*)    RDW 14.6 (*)    Platelets 62 (*)    All other components within normal limits  APTT - Abnormal; Notable for the following:    aPTT 43 (*)    All other components within normal limits  PROTIME-INR - Abnormal; Notable for the following:    Prothrombin Time 16.7 (*)    All other components within normal limits  URINALYSIS COMPLETEWITH MICROSCOPIC ([facility] ONLY) - Abnormal; Notable for the following:    Color, Urine YELLOW (*)    APPearance CLOUDY (*)    Hgb urine dipstick 2+ (*)    Protein, ur 30 (*)    Leukocytes, UA 3+ (*)    Bacteria, UA RARE (*)    Squamous Epithelial / LPF 0-5 (*)    All other components within normal limits  CULTURE, BLOOD (ROUTINE X 2)  CULTURE, BLOOD (ROUTINE X 2)  URINE CULTURE  CULTURE, EXPECTORATED SPUTUM-ASSESSMENT  LACTIC ACID, PLASMA  LIPASE, BLOOD  TROPONIN I  LACTIC ACID, PLASMA   ____________________________________________   EKG  Interpreted by me Ventricular paced rhythm, rate 102. Left axis, left bundle branch block. No acute ischemic changes.  ____________________________________________    RADIOLOGY  Chest x-ray pending, radiology system is currently down and imaging results are unavailable at the present time.  ____________________________________________   PROCEDURES CRITICAL CARE Performed by: Joni Fears, Coda Filler   Total critical care time: 35 minutes  Critical  care time was exclusive of separately billable procedures and treating other patients.  Critical care was necessary to treat or prevent imminent or life-threatening deterioration.  Critical care was time spent personally by me on the following activities: development of treatment plan with patient and/or surrogate as well as nursing, discussions with consultants, evaluation of patient's response to treatment, examination of patient, obtaining history from patient or surrogate, ordering and performing treatments and interventions, ordering and review of laboratory studies, ordering and review of radiographic studies, pulse oximetry and re-evaluation of patient's condition.   ____________________________________________   INITIAL IMPRESSION / ASSESSMENT AND PLAN / ED COURSE  Pertinent labs & imaging results that were available during my care of the patient were reviewed by me and considered in my medical decision making (see chart for details).  Patient presents with multiple criteria for systemic inflammatory response syndrome. She smells of foul urine. Likely she has recurrent urinary tract infection that is been resistant to outpatient therapy that is now causing sepsis. We'll give 2 L of normal saline, IV ceftriaxone, sent and urine cultures. We'll check labs well aggressively resuscitating.  ----------------------------------------- 1:27 PM on 01/31/2015 -----------------------------------------  Labs reveal an obvious urinary tract infection with innumerable white blood cells and clumps in the urine.she also has a markedly leukocytosis of 19,000. She is also hyponatremic. Continue IV fluids. She is requiring nasal cannula oxygen due to room air hypoxia which is a new issue and likely related to the  sepsis syndrome. We will admit for further management.     ____________________________________________   FINAL CLINICAL IMPRESSION(S) / ED DIAGNOSES  Final diagnoses:  Cystitis   Sepsis, due to unspecified organism  Acute respiratory failure with hypoxia      Carrie Mew, MD 01/31/15 [phone]

## 2015-01-31 NOTE — Progress Notes (Signed)
ANTIBIOTIC CONSULT NOTE - INITIAL  Pharmacy Consult for Ceftriaxone  Indication: Urosepsis  Allergies  Allergen Reactions  . Atenolol Other (See Comments)    Complete Heart Block  . Penicillins Other (See Comments)  . Dilaudid [Hydromorphone Hcl] Rash    Patient Measurements: Height: 5\' 1"  (154.9 cm) Weight: 119 lb (53.978 kg) IBW/kg (Calculated) : 47.8   Vital Signs: Temp: 102.4 F (39.1 C) (08/30 1352) Temp Source: Oral (08/30 1352) BP: 132/54 mmHg (08/30 1352) Pulse Rate: 96 (08/30 1352) Intake/Output from previous day:   Intake/Output from this shift:    Labs:  Recent Labs  01/31/15 1222  WBC 19.2*  HGB 11.6*  PLT 62*  CREATININE 1.13*   Estimated Creatinine Clearance: 25 mL/min (by C-G formula based on Cr of 1.13). No results for input(s): VANCOTROUGH, VANCOPEAK, VANCORANDOM, GENTTROUGH, GENTPEAK, GENTRANDOM, TOBRATROUGH, TOBRAPEAK, TOBRARND, AMIKACINPEAK, AMIKACINTROU, AMIKACIN in the last 72 hours.   Microbiology: Recent Results (from the past 720 hour(s))  Blood Culture (routine x 2)     Status: None   Collection Time: 01/10/15  6:12 PM  Result Value Ref Range Status   Specimen Description BLOOD RIGHT FOREARM  Final   Special Requests BOTTLES DRAWN AEROBIC AND ANAEROBIC 5ML  Final   Culture  Setup Time   Final    GRAM NEGATIVE RODS IN BOTH AEROBIC AND ANAEROBIC BOTTLES CRITICAL RESULT CALLED TO, READ BACK BY AND VERIFIED WITH: CHRISTINA MILES AT 3790 01/11/15 CTJ    Culture ESCHERICHIA COLI  Final   Report Status 01/13/2015 FINAL  Final   Organism ID, Bacteria ESCHERICHIA COLI  Final      Susceptibility   Escherichia coli - MIC*    AMPICILLIN >=32 RESISTANT Resistant     CEFTAZIDIME <=1 SENSITIVE Sensitive     CEFAZOLIN <=4 SENSITIVE Sensitive     CEFTRIAXONE <=1 SENSITIVE Sensitive     CIPROFLOXACIN >=4 RESISTANT Resistant     GENTAMICIN >=16 RESISTANT Resistant     IMIPENEM <=0.25 SENSITIVE Sensitive     TRIMETH/SULFA <=20 SENSITIVE  Sensitive     PIP/TAZO Value in next row Sensitive      SENSITIVE<=4    AMPICILLIN/SULBACTAM Value in next row Intermediate      INTERMEDIATE16    ERTAPENEM Value in next row Sensitive      SENSITIVE<=0.5    * ESCHERICHIA COLI  Blood Culture (routine x 2)     Status: None   Collection Time: 01/10/15  6:15 PM  Result Value Ref Range Status   Specimen Description BLOOD LEFT ANTECUBITAL  Final   Special Requests BOTTLES DRAWN AEROBIC AND ANAEROBIC 5ML  Final   Culture NO GROWTH 5 DAYS  Final   Report Status 01/15/2015 FINAL  Final  Urine culture     Status: None   Collection Time: 01/10/15  6:32 PM  Result Value Ref Range Status   Specimen Description URINE, CLEAN CATCH  Final   Special Requests NONE  Final   Culture >=100,000 COLONIES/mL ESCHERICHIA COLI  Final   Report Status 01/12/2015 FINAL  Final   Organism ID, Bacteria ESCHERICHIA COLI  Final      Susceptibility   Escherichia coli - MIC*    AMPICILLIN >=32 RESISTANT Resistant     CEFAZOLIN <=4 SENSITIVE Sensitive     CEFTRIAXONE <=1 SENSITIVE Sensitive     CIPROFLOXACIN >=4 RESISTANT Resistant     GENTAMICIN >=16 RESISTANT Resistant     IMIPENEM <=0.25 SENSITIVE Sensitive     NITROFURANTOIN <=16 SENSITIVE Sensitive  TRIMETH/SULFA <=20 SENSITIVE Sensitive     PIP/TAZO Value in next row Sensitive      SENSITIVE<=4    LEVOFLOXACIN Value in next row Resistant      RESISTANT>=8    * >=100,000 COLONIES/mL ESCHERICHIA COLI  MRSA PCR Screening     Status: None   Collection Time: 01/10/15  9:54 PM  Result Value Ref Range Status   MRSA by PCR NEGATIVE NEGATIVE Final    Comment:        The GeneXpert MRSA Assay (FDA approved for NASAL specimens only), is one component of a comprehensive MRSA colonization surveillance program. It is not intended to diagnose MRSA infection nor to guide or monitor treatment for MRSA infections.   C difficile quick scan w PCR reflex     Status: None   Collection Time: 01/11/15  5:22 AM   Result Value Ref Range Status   C Diff antigen NEGATIVE NEGATIVE Final   C Diff toxin NEGATIVE NEGATIVE Final   C Diff interpretation Negative for C. difficile  Final  Culture, blood (routine x 2)     Status: None   Collection Time: 01/12/15 10:39 AM  Result Value Ref Range Status   Specimen Description BLOOD LEFT ASSIST CONTROL  Final   Special Requests   Final    BOTTLES DRAWN AEROBIC AND ANAEROBIC  6 CC ANAEROBIC, 10 CC AEROBIC   Culture NO GROWTH 5 DAYS  Final   Report Status 01/17/2015 FINAL  Final  Culture, blood (routine x 2)     Status: None   Collection Time: 01/12/15 10:45 AM  Result Value Ref Range Status   Specimen Description BLOOD LEFT HAND  Final   Special Requests BOTTLES DRAWN AEROBIC AND ANAEROBIC  3 CC  Final   Culture NO GROWTH 5 DAYS  Final   Report Status 01/17/2015 FINAL  Final    Medical History: Past Medical History  Diagnosis Date  . HTN (hypertension)   . Syncope   . Hyperlipidemia   . Ischemic colitis   . Thrombocytopenia   . Mobitz type 2 second degree heart block   . Anemia   . Pacemaker     Medications:  Scheduled:   Infusions:  . cefTRIAXone (ROCEPHIN)  IV    . cefTRIAXone (ROCEPHIN)  IV 2 g (01/31/15 1349)  . sodium chloride 1,000 mL (01/31/15 1351)   Assessment: Pharmacy consulted to dose ceftriaxone for 81 yo female being treated for urosepsis. Patient received ceftriaxone 2g IV x 1 in ED on 8/30.    Plan:  Will continue patient on ceftriaxone 1g IV Q24hr with next scheduled dose on 8/31.   Pharmacy will continue to monitor and adjust per consult.    Simpson,Michael L 01/31/2015,1:54 PM

## 2015-01-31 NOTE — ED Notes (Signed)
General weakness, onset of symptoms yesterday.  Recent UTI and antibiotic coarse finished 2 days ago.  Today patient has a fever, 103.6

## 2015-01-31 NOTE — H&P (Signed)
[facility] [place] NAME: [name]    MR#:  [id number]  DATE OF BIRTH:  June 25, 1922  DATE OF ADMISSION:  01/31/2015  PRIMARY CARE PHYSICIAN: Ivor Reining, MD   REQUESTING/REFERRING PHYSICIAN: Joni Fears  CHIEF COMPLAINT:   Chief Complaint  Patient presents with  . Weakness    HISTORY OF PRESENT ILLNESS: [name]  is a 81 y.o. female with a known history of hypertension, hyperlipidemia, thrombocytopenia, anemia, recent UTI and admitted in hospital with bacteremia. At baseline she lives with her daughter and able to walk with a walker. For last 2 days she is feeling more weak and lightheaded when trying to get up, she also had few urinary incontinence episodes where she is not able to make it up to the bathroom. Concerned with this daughter brought her to emergency room, she was noted to have UTI with fever and hypotension and given to hospitalist team for admission.  PAST MEDICAL HISTORY:   Past Medical History  Diagnosis Date  . HTN (hypertension)   . Syncope   . Hyperlipidemia   . Ischemic colitis   . Thrombocytopenia   . Mobitz type 2 second degree heart block   . Anemia   . Pacemaker     PAST SURGICAL HISTORY:  Past Surgical History  Procedure Laterality Date  . Left hip replacement     . Cholecystectomy      SOCIAL HISTORY:  Social History  Substance Use Topics  . Smoking status: Former Research scientist (life sciences)  . Smokeless tobacco: Never Used  . Alcohol Use: No    FAMILY HISTORY:  Family History  Problem Relation Age of Onset  . Stroke Mother     DRUG ALLERGIES:  Allergies  Allergen Reactions  . Atenolol Other (See Comments)    Reaction:  Complete Heart Block  . Penicillins Other (See Comments)    Reaction:  Unknown   . Dilaudid [Hydromorphone Hcl] Rash    REVIEW OF SYSTEMS:   CONSTITUTIONAL: positive fever, fatigue or weakness. Had chills.  EYES: No blurred or double vision.  EARS, NOSE, AND THROAT: No  tinnitus or ear pain.  RESPIRATORY: No cough, shortness of breath, wheezing or hemoptysis.  CARDIOVASCULAR: No chest pain, orthopnea, edema.  GASTROINTESTINAL: No nausea, vomiting, diarrhea or abdominal pain.  GENITOURINARY: No dysuria, hematuria. Had incontinence episodes. ENDOCRINE: No polyuria, nocturia,  HEMATOLOGY: No anemia, easy bruising or bleeding SKIN: No rash or lesion. MUSCULOSKELETAL: No joint pain or arthritis.   NEUROLOGIC: No tingling, numbness, weakness.  PSYCHIATRY: No anxiety or depression.   MEDICATIONS AT HOME:  Prior to Admission medications   Medication Sig Start Date End Date Taking? Authorizing Provider  amLODipine (NORVASC) 5 MG tablet Take 5 mg by mouth daily.    Yes Historical Provider, MD  aspirin EC 81 MG tablet Take 81 mg by mouth daily.   Yes Historical Provider, MD  carvedilol (COREG) 12.5 MG tablet Take 12.5 mg by mouth 2 (two) times daily.   Yes Historical Provider, MD  furosemide (LASIX) 20 MG tablet Take 10 mg by mouth daily as needed for edema.    Yes Historical Provider, MD  hydrALAZINE (APRESOLINE) 10 MG tablet Take 10 mg by mouth 2 (two) times daily.    Yes Historical Provider, MD  HYDROcodone-acetaminophen (NORCO/VICODIN) 5-325 MG per tablet Take 1 tablet by mouth 4 (four) times daily as needed for moderate pain.   Yes Historical Provider, MD  latanoprost (XALATAN) 0.005 % ophthalmic solution Place 1 drop  into both eyes at bedtime.   Yes Historical Provider, MD  losartan (COZAAR) 100 MG tablet Take 50 mg by mouth 2 (two) times daily.   Yes Historical Provider, MD  Multiple Vitamin (MULTIVITAMIN WITH MINERALS) TABS tablet Take 1 tablet by mouth daily.   Yes Historical Provider, MD  Multiple Vitamins-Minerals (PRESERVISION AREDS 2) CAPS Take 2 capsules by mouth daily.   Yes Historical Provider, MD  Omega-3 Fatty Acids (FISH OIL) 1000 MG CAPS Take 2,000 mg by mouth daily.    Yes Historical Provider, MD  acyclovir ointment (ZOVIRAX) 5 % Apply topically  2 (two) times daily after a meal. Put on lip lesions twice a day for one week. Patient not taking: Reported on 01/31/2015 01/12/15   Aldean Jewett, MD  sulfamethoxazole-trimethoprim (BACTRIM) 400-80 MG per tablet Take 1 tablet by mouth 2 (two) times daily. Patient not taking: Reported on 01/31/2015 01/12/15   Aldean Jewett, MD      PHYSICAL EXAMINATION:   VITAL SIGNS: Blood pressure 101/44, pulse 88, temperature 102.4 F (39.1 C), temperature source Oral, resp. rate 28, height 5\' 1"  (1.549 m), weight 53.978 kg (119 lb), SpO2 93 %.  GENERAL:  81 y.o.-year-old patient lying in the bed with no acute distress.  EYES: Pupils equal, round, reactive to light and accommodation. No scleral icterus. Extraocular muscles intact.  HEENT: Head atraumatic, normocephalic. Oropharynx and nasopharynx clear.  NECK:  Supple, no jugular venous distention. No thyroid enlargement, no tenderness.  LUNGS: Normal breath sounds bilaterally, no wheezing, rales,rhonchi or crepitation. No use of accessory muscles of respiration.  CARDIOVASCULAR: S1, S2 normal. No murmurs, rubs, or gallops.  ABDOMEN: Soft, nontender, nondistended. Bowel sounds present. No organomegaly or mass.  EXTREMITIES: No pedal edema, cyanosis, or clubbing.  NEUROLOGIC: Cranial nerves II through XII are intact. Muscle strength 4/5 in all extremities. Sensation intact. Gait not checked.  PSYCHIATRIC: The patient is alert and oriented x 3.  SKIN: No obvious rash, lesion, or ulcer.   LABORATORY PANEL:   CBC  Recent Labs Lab 01/31/15 1222  WBC 19.2*  HGB 11.6*  HCT 34.8*  PLT 62*  MCV 89.5  MCH 29.7  MCHC 33.2  RDW 14.6*  LYMPHSABS 0.6*  MONOABS 3.1*  EOSABS 0.0  BASOSABS 0.0   ------------------------------------------------------------------------------------------------------------------  Chemistries   Recent Labs Lab 01/31/15 1222  NA 129*  K 3.8  CL 97*  CO2 19*  GLUCOSE 145*  BUN 12  CREATININE 1.13*  CALCIUM  8.8*  AST 23  ALT 15  ALKPHOS 72  BILITOT 1.2   ------------------------------------------------------------------------------------------------------------------ estimated creatinine clearance is 25 mL/min (by C-G formula based on Cr of 1.13). ------------------------------------------------------------------------------------------------------------------ No results for input(s): TSH, T4TOTAL, T3FREE, THYROIDAB in the last 72 hours.  Invalid input(s): FREET3   Coagulation profile  Recent Labs Lab 01/31/15 1222  INR 1.33   ------------------------------------------------------------------------------------------------------------------- No results for input(s): DDIMER in the last 72 hours. -------------------------------------------------------------------------------------------------------------------  Cardiac Enzymes  Recent Labs Lab 01/31/15 1222  TROPONINI <0.03   ------------------------------------------------------------------------------------------------------------------ Invalid input(s): POCBNP  ---------------------------------------------------------------------------------------------------------------  Urinalysis    Component Value Date/Time   COLORURINE YELLOW* 01/31/2015 1222   COLORURINE YELLOW 01/13/2013 1238   APPEARANCEUR CLOUDY* 01/31/2015 [place] 01/13/2013 1238   LABSPEC 1.008 01/31/2015 1222   LABSPEC 1.005 01/13/2013 1238   PHURINE 6.0 01/31/2015 1222   PHURINE 6.0 01/13/2013 1238   GLUCOSEU NEGATIVE 01/31/2015 1222   GLUCOSEU NEGATIVE 01/13/2013 1238   HGBUR 2+* 01/31/2015 1222   HGBUR 2+ 01/13/2013 1238  BILIRUBINUR NEGATIVE 01/31/2015 [place] 01/13/2013 [place] 01/31/2015 1222   KETONESUR 1+ 01/13/2013 1238   PROTEINUR 30* 01/31/2015 1222   PROTEINUR NEGATIVE 01/13/2013 1238   NITRITE NEGATIVE 01/31/2015 1222   NITRITE POSITIVE 01/13/2013 1238   LEUKOCYTESUR 3+*  01/31/2015 1222   LEUKOCYTESUR 3+ 01/13/2013 1238     RADIOLOGY: Dg Chest Port 1 View  01/31/2015   CLINICAL DATA:  Fever. Tachycardia. Recurrent urinary tract infections.  EXAM: PORTABLE CHEST - 1 VIEW  COMPARISON:  01/10/2015  FINDINGS: Mild cardiomegaly and ectasia of the thoracic aorta are stable. Dual lead transvenous pacemaker remains in appropriate position.  Both lungs are clear. No evidence of pulmonary edema or infiltrate. No evidence of pneumothorax or pleural effusion.  IMPRESSION: Stable mild cardiomegaly.  No acute findings.   Electronically Signed   By: Earle Gell M.D.   On: 01/31/2015 14:02   IMPRESSION AND PLAN:  * Sepsis with UTI Has fever, tachycardia and hypotension.  Recently he was treated with UTI and Escherichia coli bacteremia, sensitive to cephalosporins.  I will give IV Rocephin for now and we need to change it accordingly for the culture reports.  * Generalized weakness and dizziness  She may need physical therapy evaluation and rehabilitation.  * History of hypertension  I will hold her home medications because of borderline hypotension for now.  * Thrombocytopenia  Appears chronic, continue monitoring.   All the records are reviewed and case discussed with ED provider. Management plans discussed with the patient, family and they are in agreement.  CODE STATUS: Full.  Advance Directive Documentation        Most Recent Value   Type of Advance Directive  Healthcare Power of Attorney   Pre-existing out of facility DNR order (yellow form or pink MOST form)     "MOST" Form in Place?         TOTAL TIME TAKING CARE OF THIS PATIENT: 50 minutes.    Vaughan Basta M.D on 01/31/2015   Between 7am to 6pm - Pager - [phone]  After 6pm go to [email or web address] - password [email or web address] [facility]  Office  [phone]  CC: Primary care physician; Ivor Reining, MD

## 2015-01-31 NOTE — ED Notes (Signed)
Assisted patient to bedpan.  Voided.  Skin care given.  Patient tolerated well.

## 2015-01-31 NOTE — Progress Notes (Signed)
Pt transferred from the ED. VSS; No complaints of pain nor nausea. Tolerating diet. Daughter at bedside.

## 2015-02-01 ENCOUNTER — Encounter: Payer: Self-pay | Admitting: Radiology

## 2015-02-01 ENCOUNTER — Inpatient Hospital Stay: Payer: Medicare Other

## 2015-02-01 LAB — BASIC METABOLIC PANEL
Anion gap: 7 (ref 5–15)
BUN: 15 mg/dL (ref 6–20)
CHLORIDE: 106 mmol/L (ref 101–111)
CO2: 20 mmol/L — AB (ref 22–32)
CREATININE: 1.21 mg/dL — AB (ref 0.44–1.00)
Calcium: 7.9 mg/dL — ABNORMAL LOW (ref 8.9–10.3)
GFR calc Af Amer: 44 mL/min — ABNORMAL LOW (ref >60)
GFR calc non Af Amer: 38 mL/min — ABNORMAL LOW (ref >60)
GLUCOSE: 154 mg/dL — AB (ref 65–99)
POTASSIUM: 3.6 mmol/L (ref 3.5–5.1)
Sodium: 133 mmol/L — ABNORMAL LOW (ref 135–145)

## 2015-02-01 LAB — CBC
HEMATOCRIT: 30.5 % — AB (ref 35.0–47.0)
Hemoglobin: 9.9 g/dL — ABNORMAL LOW (ref 12.0–16.0)
MCH: 29.2 pg (ref 26.0–34.0)
MCHC: 32.3 g/dL (ref 32.0–36.0)
MCV: 90.3 fL (ref 80.0–100.0)
Platelets: 46 10*3/uL — ABNORMAL LOW (ref 150–440)
RBC: 3.38 MIL/uL — ABNORMAL LOW (ref 3.80–5.20)
RDW: 14.7 % — AB (ref 11.5–14.5)
WBC: 28.7 10*3/uL — AB (ref 3.6–11.0)

## 2015-02-01 MED ORDER — MEROPENEM 500 MG IV SOLR
500.0000 mg | Freq: Two times a day (BID) | INTRAVENOUS | Status: DC
  Administered 2015-02-01 – 2015-02-02 (×2): 500 mg via INTRAVENOUS
  Filled 2015-02-01 (×4): qty 0.5

## 2015-02-01 NOTE — Evaluation (Signed)
Physical Therapy Evaluation Patient Details Name: [name] MRN: [id number] DOB: 1922/11/09 Today's Date: 02/01/2015   History of Present Illness  Pt iw a 81 yo female who was admitted to the hospital with UTI and associated fatigue and weakness for the previous two days.   Clinical Impression  Pt presents with HTN, hyperlipidemia, thrombocytopenia, anemia, and a pacemaker. Examination reveals that pt performs bed mobility at mod I, and transfers and transfers and ambulation at [facility]. She shows fair functional strength and endurance, but does have balance issues with ambulation and decreased activity tolerance since onset of current medical episode. She will continue to benefit from skilled PT in order to address these deficits in order for her to return to her home environment safely. Pt and daughter present at bedside both agreed with this recommendation. Pt had O2 sats at rest on room air of 98%, and immediately following mobility/ambulation her O2 sats at rest on room air were 97%. Decision was made to leave pt off O2. Nurse notified and in agreement.     Follow Up Recommendations Home health PT;Supervision/Assistance - 24 hour    Equipment Recommendations  None recommended by PT    Recommendations for Other Services       Precautions / Restrictions Precautions Precautions: Fall Restrictions Weight Bearing Restrictions: No      Mobility  Bed Mobility Overal bed mobility: Modified Independent             General bed mobility comments: Pt required a lot of attempts to scoot towards the bed, but eventually was able to sit at the EOB with handrail assist.  Transfers Overall transfer level: Needs assistance Equipment used: Rolling walker (2 wheeled) Transfers: Sit to/from Stand Sit to Stand: Min guard         General transfer comment: Pt was able to transfer with cue to push off bed and lift her chest to get into full standing. Pt has relatively good functional strength  with rising and controlled lowering  Ambulation/Gait Ambulation/Gait assistance: Min guard Ambulation Distance (Feet): 40 Feet Assistive device: Rolling walker (2 wheeled) Gait Pattern/deviations: Step-through pattern;Decreased step length - right;Decreased step length - left;Shuffle Gait velocity: decreased Gait velocity interpretation: <1.8 ft/sec, indicative of risk for recurrent falls General Gait Details: Pt presents with shuffle pattern and mild unsteadiness (but no LOB) during gait that her daughter reports was not present before admission. Pt ambulates with increased time to complete task but reports no SOB or fatigue with ambulation  Stairs            Wheelchair Mobility    Modified Rankin (Stroke Patients Only)       Balance Overall balance assessment: Needs assistance Sitting-balance support: Bilateral upper extremity supported Sitting balance-Leahy Scale: Good     Standing balance support: Bilateral upper extremity supported Standing balance-Leahy Scale: Fair Standing balance comment: Pt needed stand by for standing balance as minor A/P sway was noted.                              Pertinent Vitals/Pain Pain Assessment: No/denies pain    Home Living Family/patient expects to be discharged to:: Private residence Living Arrangements: Children Available Help at Discharge: Family;Available 24 hours/day Type of Home: House Home Access: Stairs to enter Entrance Stairs-Rails: None Entrance Stairs-Number of Steps: 1 Home Layout: One level Home Equipment: Walker - 2 wheels;Cane - single point;Bedside commode      Prior Function Level of  Independence: Independent with assistive device(s)         Comments: Pt reports using a SPC but wants to transition to a RW per MD suggestion.  (Pt also has rollator walker that she occasionally uses)     Hand Dominance        Extremity/Trunk Assessment   Upper Extremity Assessment: Overall WFL for tasks  assessed           Lower Extremity Assessment: Overall WFL for tasks assessed         Communication   Communication: No difficulties  Cognition Arousal/Alertness: Awake/alert Behavior During Therapy: WFL for tasks assessed/performed Overall Cognitive Status: Within Functional Limits for tasks assessed                      General Comments      Exercises Other Exercises Other Exercises: Pt performed bilateral LE therex x10 reps at supervision for proper technique. Exercises performed: quad sets, glutes squeezes, ankle pumps, heel slides, SAQ, SLR, and hip abd/add      Assessment/Plan    PT Assessment Patient needs continued PT services  PT Diagnosis Difficulty walking;Abnormality of gait   PT Problem List Decreased activity tolerance;Decreased mobility;Decreased balance  PT Treatment Interventions DME instruction;Gait training;Stair training;Functional mobility training;Therapeutic activities;Therapeutic exercise;Balance training;Neuromuscular re-education;Patient/family education;Wheelchair mobility training;Manual techniques   PT Goals (Current goals can be found in the Care Plan section) Acute Rehab PT Goals Patient Stated Goal: wishes to go home PT Goal Formulation: With patient Time For Goal Achievement: 02/15/15 Potential to Achieve Goals: Good    Frequency Min 2X/week   Barriers to discharge        Co-evaluation               End of Session Equipment Utilized During Treatment: Gait belt Activity Tolerance: Patient tolerated treatment well Patient left: in bed;with call bell/phone within reach;with bed alarm set;with family/visitor present Nurse Communication: Mobility status (O2 saturations during ambulation)         Time: 5784-6962 PT Time Calculation (min) (ACUTE ONLY): 25 min   Charges:         PT G CodesJanyth Contes Feb 07, 2015, 5:14 PM  Janyth Contes, SPT. [phone]

## 2015-02-01 NOTE — Progress Notes (Signed)
[facility] [place] NAME: Raniah Karan    MR#:  [id number]  DATE OF BIRTH:  10-22-1922  SUBJECTIVE:  No acute events overnight. Blood cultures were positive for gram-negative rods.  REVIEW OF SYSTEMS:    Review of Systems  Constitutional: Positive for fever. Negative for chills and malaise/fatigue.  HENT: Negative for sore throat.   Eyes: Negative for blurred vision.  Respiratory: Negative for cough, hemoptysis, shortness of breath and wheezing.   Cardiovascular: Negative for chest pain, palpitations and leg swelling.  Gastrointestinal: Negative for nausea, vomiting, abdominal pain, diarrhea and blood in stool.  Genitourinary: Positive for dysuria. Negative for urgency, frequency, hematuria and flank pain.  Musculoskeletal: Negative for back pain.  Neurological: Negative for dizziness, tremors and headaches.  Endo/Heme/Allergies: Does not bruise/bleed easily.    Tolerating Diet:Yes     DRUG ALLERGIES:   Allergies  Allergen Reactions  . Atenolol Other (See Comments)    Reaction:  Complete Heart Block  . Penicillins Other (See Comments)    Reaction:  Unknown   . Dilaudid [Hydromorphone Hcl] Rash    VITALS:  Blood pressure 124/48, pulse 77, temperature 97.5 F (36.4 C), temperature source Oral, resp. rate 16, height 5\' 1"  (1.549 m), weight 53.978 kg (119 lb), SpO2 98 %.  PHYSICAL EXAMINATION:   Physical Exam  Constitutional: She is oriented to person, place, and time and well-developed, well-nourished, and in no distress. No distress.  HENT:  Head: Normocephalic.  Eyes: No scleral icterus.  Neck: Normal range of motion. Neck supple. No JVD present. No tracheal deviation present.  Cardiovascular: Normal rate, regular rhythm and normal heart sounds.  Exam reveals no gallop and no friction rub.   No murmur heard. Pulmonary/Chest: Effort normal and breath sounds normal. No respiratory distress. She has no wheezes. She  has no rales. She exhibits no tenderness.  Abdominal: Soft. Bowel sounds are normal. She exhibits no distension and no mass. There is no tenderness. There is no rebound and no guarding.  Musculoskeletal: Normal range of motion. She exhibits no edema.  Neurological: She is alert and oriented to person, place, and time.  Skin: Skin is warm. No rash noted. No erythema.  Psychiatric: Affect and judgment normal.      LABORATORY PANEL:   CBC  Recent Labs Lab 02/01/15 0505  WBC 28.7*  HGB 9.9*  HCT 30.5*  PLT 46*   ------------------------------------------------------------------------------------------------------------------  Chemistries   Recent Labs Lab 01/31/15 1222  02/01/15 0505  NA 129*  --  133*  K 3.8  --  3.6  CL 97*  --  106  CO2 19*  --  20*  GLUCOSE 145*  --  154*  BUN 12  --  15  CREATININE 1.13*  < > 1.21*  CALCIUM 8.8*  --  7.9*  AST 23  --   --   ALT 15  --   --   ALKPHOS 72  --   --   BILITOT 1.2  --   --   < > = values in this interval not displayed. ------------------------------------------------------------------------------------------------------------------  Cardiac Enzymes  Recent Labs Lab 01/31/15 1222  TROPONINI <0.03   ------------------------------------------------------------------------------------------------------------------  RADIOLOGY:  Dg Chest Port 1 View  01/31/2015   CLINICAL DATA:  Fever. Tachycardia. Recurrent urinary tract infections.  EXAM: PORTABLE CHEST - 1 VIEW  COMPARISON:  01/10/2015  FINDINGS: Mild cardiomegaly and ectasia of the thoracic aorta are stable. Dual lead transvenous pacemaker remains in appropriate position.  Both lungs are clear. No evidence of pulmonary edema or infiltrate. No evidence of pneumothorax or pleural effusion.  IMPRESSION: Stable mild cardiomegaly.  No acute findings.   Electronically Signed   By: Earle Gell M.D.   On: 01/31/2015 14:02     ASSESSMENT AND PLAN:   This is a very  pleasant 81 year old female with a history of essential hypertension and recent Escherichia coli bacteremia secondary to urinary tract infection who presents again with sepsis secondary to urinary tract infection and gram-negative rod bacteremia.  1. Sepsis: This is likely secondary to urinary tract infection. She was recently treated for Escherichia coli sepsis and bacteremia due to urinary tract infection and she presents again with similar complaint. I have consulted infectious disease to see if we need to order further testing as the patient was discharged appropriately on antibiotics and comes back to the hospital with similar issue. Continue Rocephin for now. Follow up blood and urine culture.  2. Essential hypertension: Continue Norvasc  3. Thrombocytopenia: Appears to be chronic. Follow platelet count. It is slightly lower than admission which I suspect is due to sepsis.  4. Leukocytosis: CBC is still elevated. Repeat CBC in a.m. Again ID consultation has been placed.   Management plans discussed with the patient and she  is in agreement.  CODE STATUS: Full  TOTAL TIME TAKING CARE OF THIS PATIENT: 30 minutes.     POSSIBLE D/C 3-4 days, DEPENDING ON CLINICAL CONDITION.   Jay Kempe M.D on 02/01/2015 at 12:32 PM  Between 7am to 6pm - Pager - [phone] After 6pm go to [email or web address] - password [email or web address] [facility]  Office  [phone]  CC: Primary care physician; Ivor Reining, MD

## 2015-02-01 NOTE — Progress Notes (Addendum)
Patient voided 400 cc's of urine and bladder scan done and no urine note in bladder.

## 2015-02-01 NOTE — Progress Notes (Addendum)
Shelda Pal from [facility] called to report that the CT Renal Stone Study results as follows: Examination is positive for left sided hydronephrosis, nephromegaly perinephric sat stranding. Findings may be a manifestation of obstructive uropathy pyelonephritis. No obstructive stone is identified. Complete report may be viewed in Epic.

## 2015-02-01 NOTE — Progress Notes (Signed)
Lab called with positive gram negative rods blood culture results. Dr Lavetta Nielsen made aware of increase oral temp at 102.8 and pulse 119-120. Noted WBC elevated at 24.8.  Will continue to monitor per M.D. Orders and unit protocol. Call light and phone in reach bed alarm placed.

## 2015-02-01 NOTE — Consult Note (Signed)
[name]     Reason for Consult:Sepsis, UTI, GNR bacteremia    Referring Physician: Myrtis [name] Date of Admission:  01/31/2015   Active Problems:   UTI (lower urinary tract infection)   HPI: [name] is a 81 y.o. female readmitted with a known history of hypertension, hyperlipidemia, thrombocytopenia, anemia, recent UTI with E coli bacteremia who has been admitted with fevers and sepsis and again has GNR bacteremia.  She was discharged 8/11 on oral bactrim after 2 days of IV ceftraixone.  Per pt and daughter she took 10 more days of the bactrim and at completion of course she had improvement in her sxs. However about 8 days after stopping the abx she developed nocturia, frequency, dysuria, hip and back pain and fevers.  Since admit has has persistent fevers, increasing wbc. Is on ceftriaxone.  Since admit wbc gone from 19-> 28 and fevers to 102 persist She has had 3 Utis in last 6 weeks. Prior to that has no real UTI hx, no urological issues in past. Has never seen urology.    Past Medical History  Diagnosis Date  . HTN (hypertension)   . Syncope   . Hyperlipidemia   . Ischemic colitis   . Thrombocytopenia   . Mobitz type 2 second degree heart block   . Anemia   . Pacemaker    Past Surgical History  Procedure Laterality Date  . Left hip replacement     . Cholecystectomy     Social History  Substance Use Topics  . Smoking status: Former Research scientist (life sciences)  . Smokeless tobacco: Never Used  . Alcohol Use: No   Family History  Problem Relation Age of Onset  . Stroke Mother     Allergies:  Allergies  Allergen Reactions  . Atenolol Other (See Comments)    Reaction:  Complete Heart Block  . Penicillins Other (See Comments)    Reaction:  Unknown   . Dilaudid [Hydromorphone Hcl] Rash    Current antibiotics: Antibiotics Given (last 72 hours)    Date/Time Action Medication Dose Rate   02/01/15 0942 Given   cefTRIAXone (ROCEPHIN) 1 g in dextrose 5 % 50  mL IVPB 1 g 100 mL/hr      MEDICATIONS: . amLODipine  5 mg Oral Daily  . aspirin EC  81 mg Oral Daily  . multivitamin-lutein  1 capsule Oral Daily    Review of Systems - 11 systems reviewed and negative per HPI   OBJECTIVE: Temp:  [97.5 F (36.4 C)-102.8 F (39.3 C)] 97.5 F (36.4 C) (08/31 0755) Pulse Rate:  [77-121] 77 (08/31 0755) Resp:  [16-27] 16 (08/31 0038) BP: (91-127)/(44-53) 124/48 mmHg (08/31 0755) SpO2:  [91 %-98 %] 98 % (08/31 0755) Physical Exam  Constitutional:  oriented to person, place, and time. HOH frail No distress.  HENT: [place]/AT, PERRLA, no scleral icterus Mouth/Throat: Oropharynx is clear and moist. No oropharyngeal exudate.  Cardiovascular: Normal rate, regular rhythm and normal heart sounds.2/ 6 sm PPM site wnl Pulmonary/Chest: Effort normal and breath sounds normal. No respiratory distress.  has no wheezes.  Neck = supple, no nuchal rigidity Abdominal: Soft. Bowel sounds are normal.  exhibits no distension. There is no tenderness.  Lymphadenopathy: no cervical adenopathy. No axillary adenopathy Neurological: alert and oriented to person, place, and time.  Skin: Skin is warm and dry. No rash noted. No erythema.  Psychiatric: a normal mood and affect.  behavior is normal.    LABS: Results for orders placed or performed during  the hospital encounter of 01/31/15 (from the past 48 hour(s))  Blood Culture (routine x 2)     Status: None (Preliminary result)   Collection Time: 01/31/15 11:20 AM  Result Value Ref Range   Specimen Description BLOOD RIGHT HAND    Special Requests      BOTTLES DRAWN AEROBIC AND ANAEROBIC 3CC AEROBIC,3CC ANAEROBIC   Culture  Setup Time      GRAM NEGATIVE RODS IN BOTH AEROBIC AND ANAEROBIC BOTTLES CRITICAL RESULT CALLED TO, READ BACK BY AND VERIFIED WITH: RENICIA GRAVES 02/01/2015 0120 [place] CONFIRMED BY AJO    Culture      GRAM NEGATIVE RODS IN BOTH AEROBIC AND ANAEROBIC BOTTLES IDENTIFICATION TO FOLLOW    Report  Status PENDING   Blood Culture (routine x 2)     Status: None (Preliminary result)   Collection Time: 01/31/15 11:25 AM  Result Value Ref Range   Specimen Description BLOOD LEFT ASSIST CONTROL    Special Requests      BOTTLES DRAWN AEROBIC AND ANAEROBIC 2CC AEROBIC,2CC ANAEROBIC   Culture  Setup Time      GRAM NEGATIVE RODS IN BOTH AEROBIC AND ANAEROBIC BOTTLES CRITICAL RESULT CALLED TO, READ BACK BY AND VERIFIED WITH: RENICIA GRAVES 02/01/2015 0120 [place] CONFIRMED BY AJO    Culture      GRAM NEGATIVE RODS IN BOTH AEROBIC AND ANAEROBIC BOTTLES IDENTIFICATION TO FOLLOW    Report Status PENDING   Lactic acid, plasma     Status: None   Collection Time: 01/31/15 12:19 PM  Result Value Ref Range   Lactic Acid, Venous 1.0 0.5 - 2.0 mmol/L  Comprehensive metabolic panel     Status: Abnormal   Collection Time: 01/31/15 12:22 PM  Result Value Ref Range   Sodium 129 (L) 135 - 145 mmol/L   Potassium 3.8 3.5 - 5.1 mmol/L   Chloride 97 (L) 101 - 111 mmol/L   CO2 19 (L) 22 - 32 mmol/L   Glucose, Bld 145 (H) 65 - 99 mg/dL   BUN 12 6 - 20 mg/dL   Creatinine, [name] 1.13 (H) 0.44 - 1.00 mg/dL   Calcium 8.8 (L) 8.9 - 10.3 mg/dL   Total Protein 6.9 6.5 - 8.1 g/dL   Albumin 3.4 (L) 3.5 - 5.0 g/dL   AST 23 15 - 41 U/L   ALT 15 14 - 54 U/L   Alkaline Phosphatase 72 38 - 126 U/L   Total Bilirubin 1.2 0.3 - 1.2 mg/dL   GFR calc non Af Amer 41 (L) >60 mL/min   GFR calc Af Amer 48 (L) >60 mL/min    Comment: (NOTE) The eGFR has been calculated using the CKD EPI equation. This calculation has not been validated in all clinical situations. eGFR's persistently <60 mL/min signify possible Chronic Kidney [name].    Anion gap 13 5 - 15  Lipase, blood     Status: None   Collection Time: 01/31/15 12:22 PM  Result Value Ref Range   Lipase 26 22 - 51 U/L  Troponin I     Status: None   Collection Time: 01/31/15 12:22 PM  Result Value Ref Range   Troponin I <0.03 <0.031 ng/mL    Comment:        NO  INDICATION OF MYOCARDIAL INJURY.   CBC WITH DIFFERENTIAL     Status: Abnormal   Collection Time: 01/31/15 12:22 PM  Result Value Ref Range   WBC 19.2 (H) 3.6 - 11.0 K/uL   RBC 3.89 3.80 -  5.20 MIL/uL   Hemoglobin 11.6 (L) 12.0 - 16.0 g/dL   HCT 34.8 (L) 35.0 - 47.0 %   MCV 89.5 80.0 - 100.0 fL   MCH 29.7 26.0 - 34.0 pg   MCHC 33.2 32.0 - 36.0 g/dL   RDW 14.6 (H) 11.5 - 14.5 %   Platelets 62 (L) 150 - 440 K/uL   Neutrophils Relative % 81.000000 %   Lymphocytes Relative 3.000000 %   Monocytes Relative 16.000000 %   Eosinophils Relative 0.000000 %   Basophils Relative 0.000000 %   Neutro Abs 15.5 (H) 1.4 - 6.5 K/uL   Lymphs Abs 0.6 (L) 1.0 - 3.6 K/uL   Monocytes Absolute 3.1 (H) 0.2 - 0.9 K/uL   Eosinophils Absolute 0.0 0 - 0.7 K/uL   Basophils Absolute 0.0 0 - 0.1 K/uL   Smear Review MORPHOLOGY UNREMARKABLE   APTT     Status: Abnormal   Collection Time: 01/31/15 12:22 PM  Result Value Ref Range   aPTT 43 (H) 24 - 36 seconds    Comment:        IF BASELINE aPTT IS ELEVATED, SUGGEST PATIENT RISK ASSESSMENT BE USED TO DETERMINE APPROPRIATE ANTICOAGULANT THERAPY.   Protime-INR     Status: Abnormal   Collection Time: 01/31/15 12:22 PM  Result Value Ref Range   Prothrombin Time 16.7 (H) 11.4 - 15.0 seconds   INR 1.33   Urinalysis complete, with microscopic ([facility] only)     Status: Abnormal   Collection Time: 01/31/15 12:22 PM  Result Value Ref Range   Color, Urine YELLOW (A) YELLOW   APPearance CLOUDY (A) CLEAR   Glucose, UA NEGATIVE NEGATIVE mg/dL   Bilirubin Urine NEGATIVE NEGATIVE   Ketones, ur NEGATIVE NEGATIVE mg/dL   Specific Gravity, Urine 1.008 1.005 - 1.030   Hgb urine dipstick 2+ (A) NEGATIVE   pH 6.0 5.0 - 8.0   Protein, ur 30 (A) NEGATIVE mg/dL   Nitrite NEGATIVE NEGATIVE   Leukocytes, UA 3+ (A) NEGATIVE   RBC / HPF 6-30 0 - 5 RBC/hpf   WBC, UA TOO NUMEROUS TO COUNT 0 - 5 WBC/hpf   Bacteria, UA RARE (A) NONE SEEN   Squamous Epithelial / LPF 0-5 (A) NONE  SEEN   WBC Clumps PRESENT    Mucous PRESENT    Hyaline Casts, UA PRESENT   Urine culture     Status: None (Preliminary result)   Collection Time: 01/31/15 12:22 PM  Result Value Ref Range   Specimen Description URINE, CLEAN CATCH    Special Requests NONE    Culture      >=100,000 COLONIES/mL GRAM NEGATIVE RODS IDENTIFICATION AND SUSCEPTIBILITIES TO FOLLOW    Report Status PENDING   CBC     Status: Abnormal   Collection Time: 01/31/15  9:09 PM  Result Value Ref Range   WBC 24.8 (H) 3.6 - 11.0 K/uL   RBC 3.80 3.80 - 5.20 MIL/uL   Hemoglobin 11.1 (L) 12.0 - 16.0 g/dL   HCT 34.7 (L) 35.0 - 47.0 %   MCV 91.4 80.0 - 100.0 fL   MCH 29.3 26.0 - 34.0 pg   MCHC 32.0 32.0 - 36.0 g/dL   RDW 15.2 (H) 11.5 - 14.5 %   Platelets 58 (L) 150 - 440 K/uL  Creatinine, serum     Status: Abnormal   Collection Time: 01/31/15  9:09 PM  Result Value Ref Range   Creatinine, [name] 1.27 (H) 0.44 - 1.00 mg/dL   GFR calc non Af [name]  36 (L) >60 mL/min   GFR calc Af Amer 42 (L) >60 mL/min    Comment: (NOTE) The eGFR has been calculated using the CKD EPI equation. This calculation has not been validated in all clinical situations. eGFR's persistently <60 mL/min signify possible Chronic Kidney [name].   Basic metabolic panel     Status: Abnormal   Collection Time: 02/01/15  5:05 AM  Result Value Ref Range   Sodium 133 (L) 135 - 145 mmol/L   Potassium 3.6 3.5 - 5.1 mmol/L   Chloride 106 101 - 111 mmol/L   CO2 20 (L) 22 - 32 mmol/L   Glucose, Bld 154 (H) 65 - 99 mg/dL   BUN 15 6 - 20 mg/dL   Creatinine, [name] 1.21 (H) 0.44 - 1.00 mg/dL   Calcium 7.9 (L) 8.9 - 10.3 mg/dL   GFR calc non Af Amer 38 (L) >60 mL/min   GFR calc Af Amer 44 (L) >60 mL/min    Comment: (NOTE) The eGFR has been calculated using the CKD EPI equation. This calculation has not been validated in all clinical situations. eGFR's persistently <60 mL/min signify possible Chronic Kidney [name].    Anion gap 7 5 - 15  CBC     Status:  Abnormal   Collection Time: 02/01/15  5:05 AM  Result Value Ref Range   WBC 28.7 (H) 3.6 - 11.0 K/uL   RBC 3.38 (L) 3.80 - 5.20 MIL/uL   Hemoglobin 9.9 (L) 12.0 - 16.0 g/dL   HCT 30.5 (L) 35.0 - 47.0 %   MCV 90.3 80.0 - 100.0 fL   MCH 29.2 26.0 - 34.0 pg   MCHC 32.3 32.0 - 36.0 g/dL   RDW 14.7 (H) 11.5 - 14.5 %   Platelets 46 (L) 150 - 440 K/uL   No components found for: ESR, C REACTIVE PROTEIN MICRO: Recent Results (from the past 720 hour(s))  Blood Culture (routine x 2)     Status: None   Collection Time: 01/10/15  6:12 PM  Result Value Ref Range Status   Specimen Description BLOOD RIGHT FOREARM  Final   Special Requests BOTTLES DRAWN AEROBIC AND ANAEROBIC 5ML  Final   Culture  Setup Time   Final    GRAM NEGATIVE RODS IN BOTH AEROBIC AND ANAEROBIC BOTTLES CRITICAL RESULT CALLED TO, READ BACK BY AND VERIFIED WITH: CHRISTINA MILES AT 1050 01/11/15 CTJ    Culture ESCHERICHIA COLI  Final   Report Status 01/13/2015 FINAL  Final   Organism ID, Bacteria ESCHERICHIA COLI  Final      Susceptibility   Escherichia coli - MIC*    AMPICILLIN >=32 RESISTANT Resistant     CEFTAZIDIME <=1 SENSITIVE Sensitive     CEFAZOLIN <=4 SENSITIVE Sensitive     CEFTRIAXONE <=1 SENSITIVE Sensitive     CIPROFLOXACIN >=4 RESISTANT Resistant     GENTAMICIN >=16 RESISTANT Resistant     IMIPENEM <=0.25 SENSITIVE Sensitive     TRIMETH/SULFA <=20 SENSITIVE Sensitive     PIP/TAZO Value in next row Sensitive      SENSITIVE<=4    AMPICILLIN/SULBACTAM Value in next row Intermediate      INTERMEDIATE16    ERTAPENEM Value in next row Sensitive      SENSITIVE<=0.5    * ESCHERICHIA COLI  Blood Culture (routine x 2)     Status: None   Collection Time: 01/10/15  6:15 PM  Result Value Ref Range Status   Specimen Description BLOOD LEFT ANTECUBITAL  Final   Special Requests  BOTTLES DRAWN AEROBIC AND ANAEROBIC 5ML  Final   Culture NO GROWTH 5 DAYS  Final   Report Status 01/15/2015 FINAL  Final  Urine culture      Status: None   Collection Time: 01/10/15  6:32 PM  Result Value Ref Range Status   Specimen Description URINE, CLEAN CATCH  Final   Special Requests NONE  Final   Culture >=100,000 COLONIES/mL ESCHERICHIA COLI  Final   Report Status 01/12/2015 FINAL  Final   Organism ID, Bacteria ESCHERICHIA COLI  Final      Susceptibility   Escherichia coli - MIC*    AMPICILLIN >=32 RESISTANT Resistant     CEFAZOLIN <=4 SENSITIVE Sensitive     CEFTRIAXONE <=1 SENSITIVE Sensitive     CIPROFLOXACIN >=4 RESISTANT Resistant     GENTAMICIN >=16 RESISTANT Resistant     IMIPENEM <=0.25 SENSITIVE Sensitive     NITROFURANTOIN <=16 SENSITIVE Sensitive     TRIMETH/SULFA <=20 SENSITIVE Sensitive     PIP/TAZO Value in next row Sensitive      SENSITIVE<=4    LEVOFLOXACIN Value in next row Resistant      RESISTANT>=8    * >=100,000 COLONIES/mL ESCHERICHIA COLI  MRSA PCR Screening     Status: None   Collection Time: 01/10/15  9:54 PM  Result Value Ref Range Status   MRSA by PCR NEGATIVE NEGATIVE Final    Comment:        The GeneXpert MRSA Assay (FDA approved for NASAL specimens only), is one component of a comprehensive MRSA colonization surveillance program. It is not intended to diagnose MRSA infection nor to guide or monitor treatment for MRSA infections.   C difficile quick scan w PCR reflex     Status: None   Collection Time: 01/11/15  5:22 AM  Result Value Ref Range Status   C Diff antigen NEGATIVE NEGATIVE Final   C Diff toxin NEGATIVE NEGATIVE Final   C Diff interpretation Negative for C. difficile  Final  Culture, blood (routine x 2)     Status: None   Collection Time: 01/12/15 10:39 AM  Result Value Ref Range Status   Specimen Description BLOOD LEFT ASSIST CONTROL  Final   Special Requests   Final    BOTTLES DRAWN AEROBIC AND ANAEROBIC  6 CC ANAEROBIC, 10 CC AEROBIC   Culture NO GROWTH 5 DAYS  Final   Report Status 01/17/2015 FINAL  Final  Culture, blood (routine x 2)     Status: None    Collection Time: 01/12/15 10:45 AM  Result Value Ref Range Status   Specimen Description BLOOD LEFT HAND  Final   Special Requests BOTTLES DRAWN AEROBIC AND ANAEROBIC  3 CC  Final   Culture NO GROWTH 5 DAYS  Final   Report Status 01/17/2015 FINAL  Final  Blood Culture (routine x 2)     Status: None (Preliminary result)   Collection Time: 01/31/15 11:20 AM  Result Value Ref Range Status   Specimen Description BLOOD RIGHT HAND  Final   Special Requests   Final    BOTTLES DRAWN AEROBIC AND ANAEROBIC 3CC AEROBIC,3CC ANAEROBIC   Culture  Setup Time   Final    GRAM NEGATIVE RODS IN BOTH AEROBIC AND ANAEROBIC BOTTLES CRITICAL RESULT CALLED TO, READ BACK BY AND VERIFIED WITH: RENICIA GRAVES 02/01/2015 0120 [place] CONFIRMED BY AJO    Culture   Final    GRAM NEGATIVE RODS IN BOTH AEROBIC AND ANAEROBIC BOTTLES IDENTIFICATION TO FOLLOW    Report  Status PENDING  Incomplete  Blood Culture (routine x 2)     Status: None (Preliminary result)   Collection Time: 01/31/15 11:25 AM  Result Value Ref Range Status   Specimen Description BLOOD LEFT ASSIST CONTROL  Final   Special Requests   Final    BOTTLES DRAWN AEROBIC AND ANAEROBIC 2CC AEROBIC,2CC ANAEROBIC   Culture  Setup Time   Final    GRAM NEGATIVE RODS IN BOTH AEROBIC AND ANAEROBIC BOTTLES CRITICAL RESULT CALLED TO, READ BACK BY AND VERIFIED WITH: RENICIA GRAVES 02/01/2015 0120 [place] CONFIRMED BY AJO    Culture   Final    GRAM NEGATIVE RODS IN BOTH AEROBIC AND ANAEROBIC BOTTLES IDENTIFICATION TO FOLLOW    Report Status PENDING  Incomplete  Urine culture     Status: None (Preliminary result)   Collection Time: 01/31/15 12:22 PM  Result Value Ref Range Status   Specimen Description URINE, CLEAN CATCH  Final   Special Requests NONE  Final   Culture   Final    >=100,000 COLONIES/mL GRAM NEGATIVE RODS IDENTIFICATION AND SUSCEPTIBILITIES TO FOLLOW    Report Status PENDING  Incomplete    IMAGING: Dg Chest Port 1 View  01/31/2015    CLINICAL DATA:  Fever. Tachycardia. Recurrent urinary tract infections.  EXAM: PORTABLE CHEST - 1 VIEW  COMPARISON:  01/10/2015  FINDINGS: Mild cardiomegaly and ectasia of the thoracic aorta are stable. Dual lead transvenous pacemaker remains in appropriate position.  Both lungs are clear. No evidence of pulmonary edema or infiltrate. No evidence of pneumothorax or pleural effusion.  IMPRESSION: Stable mild cardiomegaly.  No acute findings.   Electronically Signed   By: Earle Gell M.D.   On: 01/31/2015 14:02   Dg Chest Port 1 View  01/10/2015   CLINICAL DATA:  Hypoxia.  Sepsis.  Altered mental status.  EXAM: PORTABLE CHEST - 1 VIEW  COMPARISON:  None.  FINDINGS: The cardiopericardial silhouette is within normal limits for projection. Dual lead LEFT subclavian cardiac pacemaker. Tortuous thoracic aorta with atherosclerotic calcification. No airspace [name]. No pleural effusions. Prominent costochondral calcifications are present bilaterally.  IMPRESSION: No acute cardiopulmonary [name].   Electronically Signed   By: Dereck Ligas M.D.   On: 01/10/2015 18:23    Assessment:   Sherrilynn Gudgel is a 81 y.o. female readmitted with recurrent UTI and GNR sepsis. No prior hx of recurrent UTIs or other urological issues.  ID issues GNR Bacteremia- recent E coli bacteremia UTI - recent E coli UTI Fever/leukocytosis PPM and L THR as prosthetic material   Recommendations Needs imaging of urinary system - CT non contrast to eval for stone Change ceftriaxone to meropenem given increasing wbc and fever - will know sensitivities tomorrow Await ucx Check PVR May need Urology consult  Thank you very much for allowing me to participate in the care of this patient. Please call with questions.   Cheral Marker. Ola Spurr, MD

## 2015-02-01 NOTE — Progress Notes (Signed)
ANTIBIOTIC CONSULT NOTE - INITIAL  Pharmacy Consult for MEROPENEM Indication: UTI/ GNR bacteremia  Allergies  Allergen Reactions  . Atenolol Other (See Comments)    Reaction:  Complete Heart Block  . Penicillins Other (See Comments)    Reaction:  Unknown   . Dilaudid [Hydromorphone Hcl] Rash    Patient Measurements: Height: 5\' 1"  (154.9 cm) Weight: 119 lb (53.978 kg) IBW/kg (Calculated) : 47.8 Adjusted Body Weight:    Vital Signs: Temp: 97.7 F (36.5 C) (08/31 1552) Temp Source: Oral (08/31 1552) BP: 128/62 mmHg (08/31 1552) Pulse Rate: 81 (08/31 1552)  Recent Labs  01/31/15 1222 01/31/15 2109 02/01/15 0505  WBC 19.2* 24.8* 28.7*  HGB 11.6* 11.1* 9.9*  PLT 62* 58* 46*  CREATININE 1.13* 1.27* 1.21*   Estimated Creatinine Clearance: 23.3 mL/min (by C-G formula based on Cr of 1.21).   Microbiology:  Results for orders placed or performed during the hospital encounter of 01/31/15  Blood Culture (routine x 2)     Status: None (Preliminary result)   Collection Time: 01/31/15 11:20 AM  Result Value Ref Range Status   Specimen Description BLOOD RIGHT HAND  Final   Special Requests   Final    BOTTLES DRAWN AEROBIC AND ANAEROBIC 3CC AEROBIC,3CC ANAEROBIC   Culture  Setup Time   Final    GRAM NEGATIVE RODS IN BOTH AEROBIC AND ANAEROBIC BOTTLES CRITICAL RESULT CALLED TO, READ BACK BY AND VERIFIED WITH: RENICIA GRAVES 02/01/2015 0120 [place] CONFIRMED BY AJO    Culture   Final    GRAM NEGATIVE RODS IN BOTH AEROBIC AND ANAEROBIC BOTTLES IDENTIFICATION TO FOLLOW    Report Status PENDING  Incomplete  Blood Culture (routine x 2)     Status: None (Preliminary result)   Collection Time: 01/31/15 11:25 AM  Result Value Ref Range Status   Specimen Description BLOOD LEFT ASSIST CONTROL  Final   Special Requests   Final    BOTTLES DRAWN AEROBIC AND ANAEROBIC 2CC AEROBIC,2CC ANAEROBIC   Culture  Setup Time   Final    GRAM NEGATIVE RODS IN BOTH AEROBIC AND ANAEROBIC  BOTTLES CRITICAL RESULT CALLED TO, READ BACK BY AND VERIFIED WITH: RENICIA GRAVES 02/01/2015 0120 [place] CONFIRMED BY AJO    Culture   Final    GRAM NEGATIVE RODS IN BOTH AEROBIC AND ANAEROBIC BOTTLES IDENTIFICATION TO FOLLOW    Report Status PENDING  Incomplete  Urine culture     Status: None (Preliminary result)   Collection Time: 01/31/15 12:22 PM  Result Value Ref Range Status   Specimen Description URINE, CLEAN CATCH  Final   Special Requests NONE  Final   Culture   Final    >=100,000 COLONIES/mL GRAM NEGATIVE RODS IDENTIFICATION AND SUSCEPTIBILITIES TO FOLLOW    Report Status PENDING  Incomplete    Medical History: Past Medical History  Diagnosis Date  . HTN (hypertension)   . Syncope   . Hyperlipidemia   . Ischemic colitis   . Thrombocytopenia   . Mobitz type 2 second degree heart block   . Anemia   . Pacemaker     Medications:  Scheduled:  . amLODipine  5 mg Oral Daily  . aspirin EC  81 mg Oral Daily  . meropenem (MERREM) IV  500 mg Intravenous Q12H  . multivitamin-lutein  1 capsule Oral Daily   Anti-infectives    Start     Dose/Rate Route Frequency Ordered Stop   02/01/15 1800  cefTRIAXone (ROCEPHIN) 1 g in dextrose 5 % 50 mL  IVPB  Status:  Discontinued     1 g 100 mL/hr over 30 Minutes Intravenous Every 24 hours 01/31/15 1358 01/31/15 1532   02/01/15 1700  meropenem (MERREM) 500 mg in sodium chloride 0.9 % 50 mL IVPB     500 mg 100 mL/hr over 30 Minutes Intravenous Every 12 hours 02/01/15 1621     02/01/15 1000  cefTRIAXone (ROCEPHIN) 1 g in dextrose 5 % 50 mL IVPB  Status:  Discontinued     1 g 100 mL/hr over 30 Minutes Intravenous Every 24 hours 01/31/15 2214 02/01/15 1539   01/31/15 1800  cefTRIAXone (ROCEPHIN) 1 g in dextrose 5 % 50 mL IVPB  Status:  Discontinued     1 g 100 mL/hr over 30 Minutes Intravenous Every 24 hours 01/31/15 1353 01/31/15 1358   01/31/15 1236  dextrose 5 % with cefTRIAXone (ROCEPHIN) ADS Med  Status:  Discontinued     Comments:  Gwynn Burly: cabinet override      01/31/15 1236 01/31/15 1258   01/31/15 1230  cefTRIAXone (ROCEPHIN) 2 g in dextrose 5 % 50 mL IVPB     2 g 100 mL/hr over 30 Minutes Intravenous  Once 01/31/15 1220 01/31/15 1419     Assessment: [name] is a 81 y.o. female readmitted with recurrent UTI and GNR sepsis. No prior hx of recurrent UTIs or other urological issues.  Per ID MD: Needs imaging of urinary system - CT non contrast to eval for stone. Change ceftriaxone to meropenem given increasing wbc and fever - will know sensitivities tomorrow Await ucx  Plan:  Will begin Meropenem 500mg  IV Q12h based on patient's renal fxn.  F/U cultures.  Chinita Greenland PharmD Clinical Pharmacist 02/01/2015 4:30 PM

## 2015-02-01 NOTE — Clinical Documentation Improvement (Signed)
Hospitalist  "Acute hypoxic respiratory failure" is documented in the ED provider note.  Room air sat in ED was 87%.  Respiratory Rate was in the mid 20's to low 30's at times.  Patient was placed on 02 at 2 liters nasal cannula with 02 sats greater than 90%.  Respiratory rate slowed to the low 20's and upper teens.  Do you agree with the diagnosis of Acute hypoxic respiratory failure?  If so, please document the diagnosis in the progress notes and discharge indicating if the condition has improved and/or resolved.  Please exercise your independent, professional judgment when responding. A specific answer is not anticipated or expected.   Thank You, Erling Conte  RN BSN CCDS [phone] Health Information Management [place]

## 2015-02-02 DIAGNOSIS — I10 Essential (primary) hypertension: Secondary | ICD-10-CM

## 2015-02-02 DIAGNOSIS — N1330 Unspecified hydronephrosis: Secondary | ICD-10-CM

## 2015-02-02 DIAGNOSIS — R339 Retention of urine, unspecified: Secondary | ICD-10-CM | POA: Diagnosis not present

## 2015-02-02 DIAGNOSIS — C9310 Chronic myelomonocytic leukemia not having achieved remission: Secondary | ICD-10-CM | POA: Insufficient documentation

## 2015-02-02 DIAGNOSIS — N390 Urinary tract infection, site not specified: Secondary | ICD-10-CM

## 2015-02-02 DIAGNOSIS — Z79899 Other long term (current) drug therapy: Secondary | ICD-10-CM

## 2015-02-02 DIAGNOSIS — A4151 Sepsis due to Escherichia coli [E. coli]: Principal | ICD-10-CM

## 2015-02-02 DIAGNOSIS — Z950 Presence of cardiac pacemaker: Secondary | ICD-10-CM

## 2015-02-02 DIAGNOSIS — D696 Thrombocytopenia, unspecified: Secondary | ICD-10-CM

## 2015-02-02 DIAGNOSIS — R509 Fever, unspecified: Secondary | ICD-10-CM | POA: Diagnosis not present

## 2015-02-02 LAB — CBC
HEMATOCRIT: 31 % — AB (ref 35.0–47.0)
Hemoglobin: 10.3 g/dL — ABNORMAL LOW (ref 12.0–16.0)
MCH: 29.9 pg (ref 26.0–34.0)
MCHC: 33.2 g/dL (ref 32.0–36.0)
MCV: 90.2 fL (ref 80.0–100.0)
PLATELETS: 12 10*3/uL — AB (ref 150–440)
RBC: 3.44 MIL/uL — AB (ref 3.80–5.20)
RDW: 14.7 % — AB (ref 11.5–14.5)
WBC: 13.5 10*3/uL — AB (ref 3.6–11.0)

## 2015-02-02 LAB — CULTURE, BLOOD (ROUTINE X 2)

## 2015-02-02 LAB — CREATININE, SERUM
Creatinine, Ser: 0.87 mg/dL (ref 0.44–1.00)
GFR, EST NON AFRICAN AMERICAN: 57 mL/min — AB (ref >60)

## 2015-02-02 LAB — TYPE AND SCREEN
ABO/RH(D): AB POS
Antibody Screen: NEGATIVE

## 2015-02-02 LAB — URINE CULTURE

## 2015-02-02 LAB — PLATELET COUNT: Platelets: 6 10*3/uL — CL (ref 150–440)

## 2015-02-02 MED ORDER — ACETAMINOPHEN 325 MG PO TABS: 650.0000 mg | ORAL_TABLET | Freq: Four times a day (QID) | ORAL | Status: DC | PRN

## 2015-02-02 MED ORDER — CEFTRIAXONE SODIUM 1 G IJ SOLR
1.0000 g | INTRAMUSCULAR | Status: DC
  Administered 2015-02-02 – 2015-02-06 (×5): 1 g via INTRAVENOUS
  Filled 2015-02-02 (×8): qty 10

## 2015-02-02 MED ORDER — ACETAMINOPHEN 325 MG PO TABS
650.0000 mg | ORAL_TABLET | Freq: Four times a day (QID) | ORAL | Status: DC | PRN
Start: 2015-02-02 — End: 2015-02-07
  Administered 2015-02-02 – 2015-02-03 (×3): 650 mg via ORAL
  Filled 2015-02-02 (×3): qty 2

## 2015-02-02 MED ORDER — ACETAMINOPHEN 325 MG PO TABS
650.0000 mg | ORAL_TABLET | Freq: Once | ORAL | Status: AC
  Administered 2015-02-02: 650 mg via ORAL
  Filled 2015-02-02: qty 2

## 2015-02-02 MED ORDER — DIPHENHYDRAMINE HCL 25 MG PO CAPS
25.0000 mg | ORAL_CAPSULE | Freq: Once | ORAL | Status: AC
  Administered 2015-02-02: 25 mg via ORAL
  Filled 2015-02-02: qty 1

## 2015-02-02 MED ORDER — SODIUM CHLORIDE 0.9 % IV SOLN
1.0000 g | Freq: Two times a day (BID) | INTRAVENOUS | Status: DC
  Filled 2015-02-02 (×2): qty 1

## 2015-02-02 MED ORDER — SODIUM CHLORIDE 0.9 % IV SOLN
Freq: Once | INTRAVENOUS | Status: AC
  Administered 2015-02-02: via INTRAVENOUS

## 2015-02-02 NOTE — Progress Notes (Signed)
[facility]  Date of admission:  01/31/2015  Inpatient day:  02/02/2015  Consulting physician:  Dr. Max Sane   Reason for Consultation:  Thrombocytopenia.  Chief Complaint: [name] is a 81 y.o. female with history of recurrent E coli UTI and thrombocytopenia who was admitted with sepsis secondary to a recurrent UTI.  HPI: The patient has a history of thrombocytopenia dating back to 01/13/2013.  At that time, she was admitted colitis.  Counts included a hematocrit of 37.2, hemoglobin 12.8, WBC 25,600, and platelets 46,000.  Nadir platelet count was 17,000 on 01/16/2013.  Following recovery of infection, counts on 01/22/2013 included a platelet count of 136,000.  The patient was then seen in the ER on 12/23/2014 with weakness and fatigue.  She was diagnosed with an E coli UTI (resistant to ampicillin, ciprofloxacin, gentamicin, and intermediate sensitive to ampicillin/sulbactam).  Counts included a hematocrit of 34.8, hemoglobin 11.8, WBC 13,600 and platelets 73,000.  She received ceftriaxone in the ER and was sent home on a course of Keflex.  The patient represented on 01/10/2015 with confusion and lethargy.  She was diagnoses with E coli UTI (identical sensitivities to 12/23/2014) and sepsis.  Blood cultures were positive.  She received 2 days of ceftriaxone and was discharged on Septra for 10 days.  Initial CBC revealed a hematocrit of 34.2, hemoglobin 11.5, WBC 17,600, and platelets 69,000.  Discharge platelet count was 70,000.  She presented on 01/31/2015 with weakness and fatigue, fever, nocturia, frequency, dysuria, and hip and back pain.  Urine and blood cultures have again grown E coli (reistant to ampicillin, gentamicin, and ciprofloxacin).  She was placed on ceftriaxone. Initial counts included a hematocrit of 34.8, hemoglobin 11.6, WBC 19,200 and platelets 62,000.  Platelet count on 08/31 was 46,000 and 12,000 today.  Abdominal and pelvic CT scan on 02/01/2015  revealed left sided hydronephrosis with perinephric fat stranding (obstructive uropathy and/or pyelonephritis) and a new compression fracture of T12 (age indeterminate).  She has had no bruising or bleeding.  She continues to spike temperatures (Tmax 103 today).  Blood pressure has been stable.  Additional labs have included a PT of 16.7 with an INR 1.33 and PTT 43 (24-36), similar to prior admissions.  Creatinine is 0.87.  She notes a fair diet.  She denies any herbal products or quinine water.  She denies any bone pain.  She is unclear if she has ever had a transfusion.    Past Medical History  Diagnosis Date  . HTN (hypertension)   . Syncope   . Hyperlipidemia   . Ischemic colitis   . Thrombocytopenia   . Mobitz type 2 second degree heart block   . Anemia   . Pacemaker     Past Surgical History  Procedure Laterality Date  . Left hip replacement     . Cholecystectomy      Family History  Problem Relation Age of Onset  . Stroke Mother     Social History:  reports that she has quit smoking. She has never used smokeless tobacco. She reports that she does not drink alcohol or use illicit drugs.  She lives with her daughter in [place], [place].  She is alone today.  Allergies:  Allergies  Allergen Reactions  . Atenolol Other (See Comments)    Reaction:  Complete Heart Block  . Penicillins Other (See Comments)    Reaction:  Unknown   . Dilaudid [Hydromorphone Hcl] Rash    Medications Prior to Admission  Medication Sig  Dispense Refill  . amLODipine (NORVASC) 5 MG tablet Take 5 mg by mouth daily.     Marland Kitchen aspirin EC 81 MG tablet Take 81 mg by mouth daily.    . carvedilol (COREG) 12.5 MG tablet Take 12.5 mg by mouth 2 (two) times daily.    . furosemide (LASIX) 20 MG tablet Take 10 mg by mouth daily as needed for edema.     . hydrALAZINE (APRESOLINE) 10 MG tablet Take 10 mg by mouth 2 (two) times daily.     Marland Kitchen HYDROcodone-acetaminophen (NORCO/VICODIN) 5-325 MG per tablet Take 1 tablet  by mouth 4 (four) times daily as needed for moderate pain.    Marland Kitchen latanoprost (XALATAN) 0.005 % ophthalmic solution Place 1 drop into both eyes at bedtime.    Marland Kitchen losartan (COZAAR) 100 MG tablet Take 50 mg by mouth 2 (two) times daily.    . Multiple Vitamin (MULTIVITAMIN WITH MINERALS) TABS tablet Take 1 tablet by mouth daily.    . Multiple Vitamins-Minerals (PRESERVISION AREDS 2) CAPS Take 2 capsules by mouth daily.    . Omega-3 Fatty Acids (FISH OIL) 1000 MG CAPS Take 2,000 mg by mouth daily.     Marland Kitchen acyclovir ointment (ZOVIRAX) 5 % Apply topically 2 (two) times daily after a meal. Put on lip lesions twice a day for one week. (Patient not taking: Reported on 01/31/2015) 5 g 0  . sulfamethoxazole-trimethoprim (BACTRIM) 400-80 MG per tablet Take 1 tablet by mouth 2 (two) times daily. (Patient not taking: Reported on 01/31/2015) 20 tablet 0    Review of Systems: GENERAL:  General fatigue.  Fever at home.  No sweats.  Weight stable. PERFORMANCE STATUS (ECOG):  2 HEENT:  No visual changes, runny nose, sore throat, mouth sores or tenderness. Lungs: No shortness of breath or cough.  No hemoptysis. Cardiac:  No chest pain, palpitations, orthopnea, or PND. GI:  Appetite fair.  No nausea, vomiting, diarrhea, constipation, melena or hematochezia. GU:  Nocturia.  Frequency and dysuria prior to admission.  No hematuria. Musculoskeletal:  Back pain.  Hip pain.  No muscle tenderness. Extremities:  No pain or swelling. Skin:  No rashes or skin changes. Neuro:  No headache, numbness or weakness, balance or coordination issues. Endocrine:  No diabetes, thyroid issues, hot flashes or night sweats. Psych:  No mood changes, depression or anxiety. Pain:  No focal pain. Review of systems:  All other systems reviewed and found to be negative.  Physical Exam:  Blood pressure 131/63, pulse 86, temperature 98.4 F (36.9 C), temperature source Oral, resp. rate 18, height _0  (1.549 m), weight 119 lb (53.978 kg), SpO2  96 %.  GENERAL:  Elderly woman lying comfortably on the medical unit in no acute distress. MENTAL STATUS:  Alert and oriented to person, place and time. HEAD:  Blonde hair.  Normocephalic, atraumatic, face symmetric, no Cushingoid features. EYES:  Glasses.  Brown eyes.  Pupils equal round and reactive to light and accomodation.  No conjunctivitis or scleral icterus. ENT:  Dentures.  Oropharynx clear without lesion.  Tongue normal. Mucous membranes moist.  No palatal petechiae.  No nasal mucosa bleeding. RESPIRATORY:  Clear to auscultation without rales, wheezes or rhonchi. CARDIOVASCULAR:  Regular rate and rhythm without murmur, rub or gallop. ABDOMEN:  Soft, non-tender, with active bowel sounds, and no hepatosplenomegaly.  No masses. GU:  Foley catheter in place.   SKIN:  No petechiae.  No rashes, ulcers or lesions. EXTREMITIES: ICDs in place.  No edema, no skin discoloration or  tenderness.  No palpable cords. LYMPH NODES: No palpable cervical, supraclavicular, axillary or inguinal adenopathy  NEUROLOGICAL: Unremarkable. PSYCH:  Appropriate.  Results for orders placed or performed during the hospital encounter of 01/31/15 (from the past 48 hour(s))  Basic metabolic panel     Status: Abnormal   Collection Time: 02/01/15  5:05 AM  Result Value Ref Range   Sodium 133 (L) 135 - 145 mmol/L   Potassium 3.6 3.5 - 5.1 mmol/L   Chloride 106 101 - 111 mmol/L   CO2 20 (L) 22 - 32 mmol/L   Glucose, Bld 154 (H) 65 - 99 mg/dL   BUN 15 6 - 20 mg/dL   Creatinine, Ser 1.21 (H) 0.44 - 1.00 mg/dL   Calcium 7.9 (L) 8.9 - 10.3 mg/dL   GFR calc non Af Amer 38 (L) >60 mL/min   GFR calc Af Amer 44 (L) >60 mL/min    Comment: (NOTE) The eGFR has been calculated using the CKD EPI equation. This calculation has not been validated in all clinical situations. eGFR's persistently <60 mL/min signify possible Chronic Kidney Disease.    Anion gap 7 5 - 15  CBC     Status: Abnormal   Collection Time: 02/01/15   5:05 AM  Result Value Ref Range   WBC 28.7 (H) 3.6 - 11.0 K/uL   RBC 3.38 (L) 3.80 - 5.20 MIL/uL   Hemoglobin 9.9 (L) 12.0 - 16.0 g/dL   HCT 30.5 (L) 35.0 - 47.0 %   MCV 90.3 80.0 - 100.0 fL   MCH 29.2 26.0 - 34.0 pg   MCHC 32.3 32.0 - 36.0 g/dL   RDW 14.7 (H) 11.5 - 14.5 %   Platelets 46 (L) 150 - 440 K/uL  CBC     Status: Abnormal   Collection Time: 02/02/15  4:08 AM  Result Value Ref Range   WBC 13.5 (H) 3.6 - 11.0 K/uL   RBC 3.44 (L) 3.80 - 5.20 MIL/uL   Hemoglobin 10.3 (L) 12.0 - 16.0 g/dL   HCT 31.0 (L) 35.0 - 47.0 %   MCV 90.2 80.0 - 100.0 fL   MCH 29.9 26.0 - 34.0 pg   MCHC 33.2 32.0 - 36.0 g/dL   RDW 14.7 (H) 11.5 - 14.5 %   Platelets 12 (LL) 150 - 440 K/uL    Comment: CRITICAL RESULT CALLED TO, READ BACK BY AND VERIFIED WITH: DAWN SONGTER _0  02/02/15 BY AJO   Creatinine, serum     Status: Abnormal   Collection Time: 02/02/15  4:08 AM  Result Value Ref Range   Creatinine, Ser 0.87 0.44 - 1.00 mg/dL   GFR calc non Af Amer 57 (L) >60 mL/min   GFR calc Af Amer >60 >60 mL/min    Comment: (NOTE) The eGFR has been calculated using the CKD EPI equation. This calculation has not been validated in all clinical situations. eGFR's persistently <60 mL/min signify possible Chronic Kidney Disease.   Type and screen     Status: None (Preliminary result)   Collection Time: 02/02/15  8:44 PM  Result Value Ref Range   ABO/RH(D) PENDING    Antibody Screen PENDING    Sample Expiration 02/05/2015    Ct Renal Stone Study  02/01/2015   CLINICAL DATA:  Nocturia, urinary frequency and dysuria. Hip and back pain. Fevers. Recent urinary tract infection.  EXAM: CT ABDOMEN AND PELVIS WITHOUT CONTRAST  TECHNIQUE: Multidetector CT imaging of the abdomen and pelvis was performed following the standard protocol without IV contrast.  COMPARISON:  01/13/2013  FINDINGS: Lower chest: Small bilateral pleural effusions noted. Calcified granuloma is identified within the right lower lobe.   Hepatobiliary: Low attenuation foci within the liver are again noted and similar tooth 2014 examination and likely represent benign cysts. Previous cholecystectomy. Chronic increase caliber of the common bile duct is identified measuring up to 7 mm.  Pancreas: The pancreas is unremarkable.  Spleen: Multiple splenic granulomas identified.  Adrenals/Urinary Tract: Chronic enlargement of the adrenal glands appear similar to previous study. Low lying and mi rotated right kidney is noted. There is asymmetric left-sided hydronephrosis, nephro megaly and perinephric fat stranding. No obstructing stone identified. There is moderate distension of the urinary bladder.  Stomach/Bowel: The stomach appears normal. The small bowel loops have a normal caliber. No obstruction. Unremarkable appearance of the colon.  Vascular/Lymphatic: There is aortic atherosclerosis and tortuosity. No aneurysm. No upper abdominal adenopathy noted. No pelvic or inguinal adenopathy noted.  Reproductive: The uterus and the adnexal structures are within normal limits.  Other: No free fluid or fluid collections identified.  Musculoskeletal: Previous left hip arthroplasty. Scoliosis and degenerative disc disease noted within the lumbar spine. Compression fracture involving the T12 vertebra is identified and is new from previous examination.  IMPRESSION: 1. Examination is positive for left-sided hydronephrosis, nephromegaly and perinephric fat stranding. Findings may be a manifestation of obstructive uropathy and/or pyelonephritis. No obstructing stone identified. No obstructing stone identified. 2. Aortic atherosclerosis 3. Prior granulomatous disease 4. Osteopenia and lumbar spondylosis. 5. New T12 compression fracture is age indeterminate. 6. New bilateral pleural effusions.   Electronically Signed   By: Kerby Moors M.D.   On: 02/01/2015 17:20    Assessment:  The patient is a 81 y.o. woman with recurrent E coli UTI and associated bacteremia.  She  has a history of thrombocytopenia secondary to consumption associated with infection.  She likely has poor marrow reserve secondary to her age.  In addition, she was recently on a course of Septra, known myelosuppressant agent, prior to admission.  Peripheral smear reveals marked thrombocytopenia, scattered teardrop cells, and rare schistocytes.  Creatinine is at baseline.  No evidence of TTP.  MAR with notation of discontinuation of heparin, but unclear if patient received (doubt HIT).  Clinically stable and thus doubt DIC.  Diet is modest.  She has been on no herbal products.  She denies hepatitis or prior transfusion.  Plan:   1.  Labs: SPEP, free light chains, fibrinogen, ANA, B12, folate, type and screen. 2.  Discontinue aspirin.  No heparin. 3.  Transfuse platelets if bleeding or platelet count less than 10,000. 4.  If platelets transfused, check 1 hour post platelet count. 5.  Follow CBC with diff daily.  Anticipate platelet count will improve once fevers resolve and infection cleared.  Thank you for allowing me to participate in [name] 's care.  I will follow her closely with you while hospitalized and after discharge in the outpatient department.  [name] Asal, MD  02/02/2015, 9:38 PM

## 2015-02-02 NOTE — Progress Notes (Signed)
[place] [facility] INFECTIOUS DISE[facility] PROGRESS NOTE Date of Admission:  01/31/2015     ID: [name] is a 81 y.o. female with UTI, hydronephrosis and pyelnephritis  Active Problems:   UTI (lower urinary tract infection)   Subjective: Still high grade fevers. CT shows pyelo and obstruction  ROS  Eleven systems are reviewed and negative except per hpi  Medications:  Antibiotics Given (last 72 hours)    Date/Time Action Medication Dose Rate   02/01/15 0942 Given   cefTRIAXone (ROCEPHIN) 1 g in dextrose 5 % 50 mL IVPB 1 g 100 mL/hr   02/01/15 1715 Given   meropenem (MERREM) 500 mg in sodium chloride 0.9 % 50 mL IVPB 500 mg 100 mL/hr   02/02/15 0640 Given   meropenem (MERREM) 500 mg in sodium chloride 0.9 % 50 mL IVPB 500 mg 100 mL/hr     . amLODipine  5 mg Oral Daily  . aspirin EC  81 mg Oral Daily  . meropenem (MERREM) IV  1 g Intravenous Q12H  . multivitamin-lutein  1 capsule Oral Daily    Objective: Vital signs in last 24 hours: Temp:  [97.7 F (36.5 C)-103 F (39.4 C)] 103 F (39.4 C) (09/01 1050) Pulse Rate:  [81-101] 85 (09/01 0733) Resp:  [17-18] 18 (09/01 0733) BP: (128-158)/(62-88) 151/88 mmHg (09/01 1050) SpO2:  [95 %-99 %] 96 % (09/01 0750) Constitutional: oriented to person, place, and time. HOH frail No distress.  HENT: [place]/AT, PERRLA, no scleral icterus Mouth/Throat: Oropharynx is clear and moist. No oropharyngeal exudate.  Cardiovascular: Normal rate, regular rhythm and normal heart sounds.2/ 6 sm PPM site wnl Pulmonary/Chest: Effort normal and breath sounds normal. No respiratory distress. has no wheezes.  Neck = supple, no nuchal rigidity Abdominal: Soft. Bowel sounds are normal. exhibits no distension. There is no tenderness.  Lymphadenopathy: no cervical adenopathy. No axillary adenopathy Neurological: alert and oriented to person, place, and time.  Skin: Skin is warm and dry. No rash noted. No erythema.   Lab Results  Recent Labs   01/31/15 1222  02/01/15 0505 02/02/15 0408  WBC 19.2*  < > 28.7* 13.5*  HGB 11.6*  < > 9.9* 10.3*  HCT 34.8*  < > 30.5* 31.0*  NA 129*  --  133*  --   K 3.8  --  3.6  --   CL 97*  --  106  --   CO2 19*  --  20*  --   BUN 12  --  15  --   CREATININE 1.13*  < > 1.21* 0.87  < > = values in this interval not displayed.  Microbiology: Results for orders placed or performed during the hospital encounter of 01/31/15  Blood Culture (routine x 2)     Status: None   Collection Time: 01/31/15 11:20 AM  Result Value Ref Range Status   Specimen Description BLOOD RIGHT HAND  Final   Special Requests   Final    BOTTLES DRAWN AEROBIC AND ANAEROBIC 3CC AEROBIC,3CC ANAEROBIC   Culture  Setup Time   Final    GRAM NEGATIVE RODS IN BOTH AEROBIC AND ANAEROBIC BOTTLES CRITICAL RESULT CALLED TO, READ BACK BY AND VERIFIED WITH: RENICIA GRAVES 02/01/2015 0120 [place] CONFIRMED BY AJO    Culture   Final    ESCHERICHIA COLI IN BOTH AEROBIC AND ANAEROBIC BOTTLES    Report Status 02/02/2015 FINAL  Final   Organism ID, Bacteria ESCHERICHIA COLI  Final      Susceptibility   Escherichia coli -  MIC*    AMPICILLIN >=32 RESISTANT Resistant     CEFTAZIDIME <=1 SENSITIVE Sensitive     CEFAZOLIN <=4 SENSITIVE Sensitive     CEFTRIAXONE <=1 SENSITIVE Sensitive     CIPROFLOXACIN >=4 RESISTANT Resistant     GENTAMICIN >=16 RESISTANT Resistant     IMIPENEM <=0.25 SENSITIVE Sensitive     TRIMETH/SULFA <=20 SENSITIVE Sensitive     PIP/TAZO Value in next row Sensitive      SENSITIVE<=4    * ESCHERICHIA COLI  Blood Culture (routine x 2)     Status: None   Collection Time: 01/31/15 11:25 AM  Result Value Ref Range Status   Specimen Description BLOOD LEFT ASSIST CONTROL  Final   Special Requests   Final    BOTTLES DRAWN AEROBIC AND ANAEROBIC 2CC AEROBIC,2CC ANAEROBIC   Culture  Setup Time   Final    GRAM NEGATIVE RODS IN BOTH AEROBIC AND ANAEROBIC BOTTLES CRITICAL RESULT CALLED TO, READ BACK BY AND VERIFIED WITH:  RENICIA GRAVES 02/01/2015 0120 [place] CONFIRMED BY AJO    Culture   Final    ESCHERICHIA COLI IN BOTH AEROBIC AND ANAEROBIC BOTTLES    Report Status 02/02/2015 FINAL  Final   Organism ID, Bacteria ESCHERICHIA COLI  Final      Susceptibility   Escherichia coli - MIC*    AMPICILLIN >=32 RESISTANT Resistant     CEFTAZIDIME <=1 SENSITIVE Sensitive     CEFAZOLIN <=4 SENSITIVE Sensitive     CEFTRIAXONE <=1 SENSITIVE Sensitive     CIPROFLOXACIN >=4 RESISTANT Resistant     GENTAMICIN >=16 RESISTANT Resistant     IMIPENEM <=0.25 SENSITIVE Sensitive     TRIMETH/SULFA <=20 SENSITIVE Sensitive     PIP/TAZO Value in next row Sensitive      SENSITIVE<=4    * ESCHERICHIA COLI  Urine culture     Status: None   Collection Time: 01/31/15 12:22 PM  Result Value Ref Range Status   Specimen Description URINE, CLEAN CATCH  Final   Special Requests NONE  Final   Culture >=100,000 COLONIES/mL ESCHERICHIA COLI  Final   Report Status 02/02/2015 FINAL  Final   Organism ID, Bacteria ESCHERICHIA COLI  Final      Susceptibility   Escherichia coli - MIC*    AMPICILLIN >=32 RESISTANT Resistant     CEFAZOLIN <=4 SENSITIVE Sensitive     CEFTRIAXONE <=1 SENSITIVE Sensitive     CIPROFLOXACIN >=4 RESISTANT Resistant     GENTAMICIN >=16 RESISTANT Resistant     IMIPENEM <=0.25 SENSITIVE Sensitive     NITROFURANTOIN <=16 SENSITIVE Sensitive     TRIMETH/SULFA <=20 SENSITIVE Sensitive     PIP/TAZO Value in next row Sensitive      SENSITIVE<=4    * >=100,000 COLONIES/mL ESCHERICHIA COLI    Studies/Results: Ct Renal Stone Study  02/01/2015   CLINICAL DATA:  Nocturia, urinary frequency and dysuria. Hip and back pain. Fevers. Recent urinary tract infection.  EXAM: CT ABDOMEN AND PELVIS WITHOUT CONTRAST  TECHNIQUE: Multidetector CT imaging of the abdomen and pelvis was performed following the standard protocol without IV contrast.  COMPARISON:  01/13/2013  FINDINGS: Lower chest: Small bilateral pleural effusions noted.  Calcified granuloma is identified within the right lower lobe.  Hepatobiliary: Low attenuation foci within the liver are again noted and similar tooth 2014 examination and likely represent benign cysts. Previous cholecystectomy. Chronic increase caliber of the common bile duct is identified measuring up to 7 mm.  Pancreas: The pancreas is unremarkable.  Spleen:  Multiple splenic granulomas identified.  Adrenals/Urinary Tract: Chronic enlargement of the adrenal glands appear similar to previous study. Low lying and mi rotated right kidney is noted. There is asymmetric left-sided hydronephrosis, nephro megaly and perinephric fat stranding. No obstructing stone identified. There is moderate distension of the urinary bladder.  Stomach/Bowel: The stomach appears normal. The small bowel loops have a normal caliber. No obstruction. Unremarkable appearance of the colon.  Vascular/Lymphatic: There is aortic atherosclerosis and tortuosity. No aneurysm. No upper abdominal adenopathy noted. No pelvic or inguinal adenopathy noted.  Reproductive: The uterus and the adnexal structures are within normal limits.  Other: No free fluid or fluid collections identified.  Musculoskeletal: Previous left hip arthroplasty. Scoliosis and degenerative disc disease noted within the lumbar spine. Compression fracture involving the T12 vertebra is identified and is new from previous examination.  IMPRESSION: 1. Examination is positive for left-sided hydronephrosis, nephromegaly and perinephric fat stranding. Findings may be a manifestation of obstructive uropathy and/or pyelonephritis. No obstructing stone identified. No obstructing stone identified. 2. Aortic atherosclerosis 3. Prior granulomatous disease 4. Osteopenia and lumbar spondylosis. 5. New T12 compression fracture is age indeterminate. 6. New bilateral pleural effusions.   Electronically Signed   By: Kerby Moors M.D.   On: 02/01/2015 17:20    Assessment/Plan: [name] is a 81  y.o. female readmitted with recurrent UTI and E coli sepsis. No prior hx of recurrent UTIs or other urological issues.  CT stone protocol shows L hydro, nephromegaly and perinephric fat stranding.  No stone identified so unclear cause of obstruction.    ID issues E coli Bacteremia- recent E coli bacteremia E coli UTI - recent E coli UTI Pyelonephritis with L hydronephrosis Fever/leukocytosis PPM and L THR as prosthetic material   Recommendations Urology consult pending - will need intervention  Change back to ceftriaxone now that sensis available  Thank you very much for allowing me to participate in the care of this patient. Please call with questions. Thank you very much for the consult. Will follow with you.  Painter, [place]   02/02/2015, 1:31 PM

## 2015-02-02 NOTE — Progress Notes (Signed)
Called Dr. Jannifer [place] @ 2150 02/02/15 to inform him of patient's critically low platelet count of 6.  Dr. Jannifer [place] talked with patient to get consent for transfusion but patient wanted to wait and talk to her daughter.  I called daughter and explained situation; daughter came to hospital to talk to mother about transfusion.  Patient consented and Dr. Jannifer [place] put in order for 1 unit of platelets to run 2 hours.  Called lab and they will call me when platelets are ready.  Christene Slates  9/2/201612:05 AM

## 2015-02-02 NOTE — Progress Notes (Signed)
ANTIBIOTIC CONSULT NOTE - INITIAL  Pharmacy Consult for MEROPENEM Indication: UTI/ GNR bacteremia  Allergies  Allergen Reactions  . Atenolol Other (See Comments)    Reaction:  Complete Heart Block  . Penicillins Other (See Comments)    Reaction:  Unknown   . Dilaudid [Hydromorphone Hcl] Rash    Patient Measurements: Height: 5\' 1"  (154.9 cm) Weight: 119 lb (53.978 kg) IBW/kg (Calculated) : 47.8 Adjusted Body Weight:    Vital Signs: Temp: 103 F (39.4 C) (09/01 1050) Temp Source: Oral (09/01 1050) BP: 151/88 mmHg (09/01 1050) Pulse Rate: 85 (09/01 0733)  Recent Labs  01/31/15 2109 02/01/15 0505 02/02/15 0408  WBC 24.8* 28.7* 13.5*  HGB 11.1* 9.9* 10.3*  PLT 58* 46* 12*  CREATININE 1.27* 1.21* 0.87   Estimated Creatinine Clearance: 32.4 mL/min (by C-G formula based on Cr of 0.87).   Microbiology:  Results for orders placed or performed during the hospital encounter of 01/31/15  Blood Culture (routine x 2)     Status: None   Collection Time: 01/31/15 11:20 AM  Result Value Ref Range Status   Specimen Description BLOOD RIGHT HAND  Final   Special Requests   Final    BOTTLES DRAWN AEROBIC AND ANAEROBIC 3CC AEROBIC,3CC ANAEROBIC   Culture  Setup Time   Final    GRAM NEGATIVE RODS IN BOTH AEROBIC AND ANAEROBIC BOTTLES CRITICAL RESULT CALLED TO, READ BACK BY AND VERIFIED WITH: RENICIA GRAVES 02/01/2015 0120 [place] CONFIRMED BY AJO    Culture   Final    ESCHERICHIA COLI IN BOTH AEROBIC AND ANAEROBIC BOTTLES    Report Status 02/02/2015 FINAL  Final   Organism ID, Bacteria ESCHERICHIA COLI  Final      Susceptibility   Escherichia coli - MIC*    AMPICILLIN >=32 RESISTANT Resistant     CEFTAZIDIME <=1 SENSITIVE Sensitive     CEFAZOLIN <=4 SENSITIVE Sensitive     CEFTRIAXONE <=1 SENSITIVE Sensitive     CIPROFLOXACIN >=4 RESISTANT Resistant     GENTAMICIN >=16 RESISTANT Resistant     IMIPENEM <=0.25 SENSITIVE Sensitive     TRIMETH/SULFA <=20 SENSITIVE Sensitive    PIP/TAZO Value in next row Sensitive      SENSITIVE<=4    * ESCHERICHIA COLI  Blood Culture (routine x 2)     Status: None   Collection Time: 01/31/15 11:25 AM  Result Value Ref Range Status   Specimen Description BLOOD LEFT ASSIST CONTROL  Final   Special Requests   Final    BOTTLES DRAWN AEROBIC AND ANAEROBIC 2CC AEROBIC,2CC ANAEROBIC   Culture  Setup Time   Final    GRAM NEGATIVE RODS IN BOTH AEROBIC AND ANAEROBIC BOTTLES CRITICAL RESULT CALLED TO, READ BACK BY AND VERIFIED WITH: RENICIA GRAVES 02/01/2015 0120 [facility] CONFIRMED BY AJO    Culture   Final    ESCHERICHIA COLI IN BOTH AEROBIC AND ANAEROBIC BOTTLES    Report Status 02/02/2015 FINAL  Final   Organism ID, Bacteria ESCHERICHIA COLI  Final      Susceptibility   Escherichia coli - MIC*    AMPICILLIN >=32 RESISTANT Resistant     CEFTAZIDIME <=1 SENSITIVE Sensitive     CEFAZOLIN <=4 SENSITIVE Sensitive     CEFTRIAXONE <=1 SENSITIVE Sensitive     CIPROFLOXACIN >=4 RESISTANT Resistant     GENTAMICIN >=16 RESISTANT Resistant     IMIPENEM <=0.25 SENSITIVE Sensitive     TRIMETH/SULFA <=20 SENSITIVE Sensitive     PIP/TAZO Value in next row Sensitive  SENSITIVE<=4    * ESCHERICHIA COLI  Urine culture     Status: None (Preliminary result)   Collection Time: 01/31/15 12:22 PM  Result Value Ref Range Status   Specimen Description URINE, CLEAN CATCH  Final   Special Requests NONE  Final   Culture   Final    >=100,000 COLONIES/mL GRAM NEGATIVE RODS IDENTIFICATION AND SUSCEPTIBILITIES TO FOLLOW    Report Status PENDING  Incomplete    Medical History: Past Medical History  Diagnosis Date  . HTN (hypertension)   . Syncope   . Hyperlipidemia   . Ischemic colitis   . Thrombocytopenia   . Mobitz type 2 second degree heart block   . Anemia   . Pacemaker     Medications:  Scheduled:  . amLODipine  5 mg Oral Daily  . aspirin EC  81 mg Oral Daily  . meropenem (MERREM) IV  500 mg Intravenous Q12H  .  multivitamin-lutein  1 capsule Oral Daily   Anti-infectives    Start     Dose/Rate Route Frequency Ordered Stop   02/01/15 1800  cefTRIAXone (ROCEPHIN) 1 g in dextrose 5 % 50 mL IVPB  Status:  Discontinued     1 g 100 mL/hr over 30 Minutes Intravenous Every 24 hours 01/31/15 1358 01/31/15 1532   02/01/15 1700  meropenem (MERREM) 500 mg in sodium chloride 0.9 % 50 mL IVPB     500 mg 100 mL/hr over 30 Minutes Intravenous Every 12 hours 02/01/15 1621     02/01/15 1000  cefTRIAXone (ROCEPHIN) 1 g in dextrose 5 % 50 mL IVPB  Status:  Discontinued     1 g 100 mL/hr over 30 Minutes Intravenous Every 24 hours 01/31/15 2214 02/01/15 1539   01/31/15 1800  cefTRIAXone (ROCEPHIN) 1 g in dextrose 5 % 50 mL IVPB  Status:  Discontinued     1 g 100 mL/hr over 30 Minutes Intravenous Every 24 hours 01/31/15 1353 01/31/15 1358   01/31/15 1236  dextrose 5 % with cefTRIAXone (ROCEPHIN) ADS Med  Status:  Discontinued    Comments:  Gwynn Burly: cabinet override      01/31/15 1236 01/31/15 1258   01/31/15 1230  cefTRIAXone (ROCEPHIN) 2 g in dextrose 5 % 50 mL IVPB     2 g 100 mL/hr over 30 Minutes Intravenous  Once 01/31/15 1220 01/31/15 1419     Assessment: [name] is a 81 y.o. female readmitted with recurrent UTI and GNR sepsis. No prior hx of recurrent UTIs or other urological issues. Change ceftriaxone to meropenem given increasing wbc and fever - will know sensitivities tomorrow Bcx x2 growing E Coli  Plan:  Current order for Meropenem 500mg  IV Q12h Renal function has improved, will increase dose to meropenem 1 g IV q12h F/U cultures.   Rayna Sexton, PharmD, BCPS Clinical Pharmacist 02/02/2015 11:25 AM

## 2015-02-02 NOTE — Progress Notes (Signed)
Order a repeat urine culture per MD Ola Spurr

## 2015-02-02 NOTE — Care Management Important Message (Signed)
Important Message  Patient Details  Name: [name] MRN: [id number] Date of Birth: 1923-01-22   Medicare Important Message Given:  Yes-third notification given    Juliann Pulse A Allmond 02/02/2015, 1:39 PM

## 2015-02-02 NOTE — Progress Notes (Signed)
Per MD Carlynn Spry order a cooling blanket for febrile patient, cool to 37 degrees celsius

## 2015-02-02 NOTE — Consult Note (Signed)
Urology Consult  Referring physician: Dr. Manuella Ghazi Reason for referral: bilateral hydronephrosis  Chief Complaint: urinary frequency  History of Present Illness: [name] is a 81yo admitted with fever and UTI. She underwent CT scan which showed a distended bladder and bilateral moderate hydronephrosis. Prior to admission she was having increased urinary frequency, urgency, nocturia. She does not recall having issues with retention in the past. She also has new onset abdominal distention with mild constant abdominal pain. Currently her fever is 103F, WBC count is 19.  Past Medical History  Diagnosis Date  . HTN (hypertension)   . Syncope   . Hyperlipidemia   . Ischemic colitis   . Thrombocytopenia   . Mobitz type 2 second degree heart block   . Anemia   . Pacemaker    Past Surgical History  Procedure Laterality Date  . Left hip replacement     . Cholecystectomy      Medications: I have reviewed the patient's current medications. Allergies:  Allergies  Allergen Reactions  . Atenolol Other (See Comments)    Reaction:  Complete Heart Block  . Penicillins Other (See Comments)    Reaction:  Unknown   . Dilaudid [Hydromorphone Hcl] Rash    Family History  Problem Relation Age of Onset  . Stroke Mother    Social History:  reports that she has quit smoking. She has never used smokeless tobacco. She reports that she does not drink alcohol or use illicit drugs.  Review of Systems  Constitutional: Positive for fever.  Gastrointestinal: Positive for nausea and vomiting.  Genitourinary: Positive for urgency and frequency.  All other systems reviewed and are negative.   Physical Exam:  Vital signs in last 24 hours: Temp:  [98.3 F (36.8 C)-103 F (39.4 C)] 98.4 F (36.9 C) (09/01 1532) Pulse Rate:  [85-101] 86 (09/01 1532) Resp:  [18] 18 (09/01 1532) BP: (131-158)/(63-88) 131/63 mmHg (09/01 1532) SpO2:  [95 %-97 %] 96 % (09/01 1532) Physical Exam  Constitutional: She is  oriented to person, place, and time. She appears well-developed and well-nourished.  HENT:  Head: Normocephalic and atraumatic.  Eyes: EOM are normal. Pupils are equal, round, and reactive to light.  Neck: Normal range of motion. No thyromegaly present.  Cardiovascular: Normal rate and regular rhythm.   Respiratory: Effort normal. No respiratory distress. She has no wheezes.  GI: Soft. Normal appearance. She exhibits no distension. There is tenderness in the suprapubic area.  Musculoskeletal: Normal range of motion.  Neurological: She is alert and oriented to person, place, and time.  Skin: Skin is warm and dry.  Psychiatric: She has a normal mood and affect. Her behavior is normal. Judgment and thought content normal.    Laboratory Data:  Results for orders placed or performed during the hospital encounter of 01/31/15 (from the past 72 hour(s))  Blood Culture (routine x 2)     Status: None   Collection Time: 01/31/15 11:20 AM  Result Value Ref Range   Specimen Description BLOOD RIGHT HAND    Special Requests      BOTTLES DRAWN AEROBIC AND ANAEROBIC 3CC AEROBIC,3CC ANAEROBIC   Culture  Setup Time      GRAM NEGATIVE RODS IN BOTH AEROBIC AND ANAEROBIC BOTTLES CRITICAL RESULT CALLED TO, READ BACK BY AND VERIFIED WITH: RENICIA GRAVES 02/01/2015 0120 [place] CONFIRMED BY AJO    Culture      ESCHERICHIA COLI IN BOTH AEROBIC AND ANAEROBIC BOTTLES    Report Status 02/02/2015 FINAL    Organism  ID, Bacteria ESCHERICHIA COLI       Susceptibility   Escherichia coli - MIC*    AMPICILLIN >=32 RESISTANT Resistant     CEFTAZIDIME <=1 SENSITIVE Sensitive     CEFAZOLIN <=4 SENSITIVE Sensitive     CEFTRIAXONE <=1 SENSITIVE Sensitive     CIPROFLOXACIN >=4 RESISTANT Resistant     GENTAMICIN >=16 RESISTANT Resistant     IMIPENEM <=0.25 SENSITIVE Sensitive     TRIMETH/SULFA <=20 SENSITIVE Sensitive     PIP/TAZO Value in next row Sensitive      SENSITIVE<=4    * ESCHERICHIA COLI  Blood Culture  (routine x 2)     Status: None   Collection Time: 01/31/15 11:25 AM  Result Value Ref Range   Specimen Description BLOOD LEFT ASSIST CONTROL    Special Requests      BOTTLES DRAWN AEROBIC AND ANAEROBIC 2CC AEROBIC,2CC ANAEROBIC   Culture  Setup Time      GRAM NEGATIVE RODS IN BOTH AEROBIC AND ANAEROBIC BOTTLES CRITICAL RESULT CALLED TO, READ BACK BY AND VERIFIED WITH: RENICIA GRAVES 02/01/2015 0120 [place] CONFIRMED BY AJO    Culture      ESCHERICHIA COLI IN BOTH AEROBIC AND ANAEROBIC BOTTLES    Report Status 02/02/2015 FINAL    Organism ID, Bacteria ESCHERICHIA COLI       Susceptibility   Escherichia coli - MIC*    AMPICILLIN >=32 RESISTANT Resistant     CEFTAZIDIME <=1 SENSITIVE Sensitive     CEFAZOLIN <=4 SENSITIVE Sensitive     CEFTRIAXONE <=1 SENSITIVE Sensitive     CIPROFLOXACIN >=4 RESISTANT Resistant     GENTAMICIN >=16 RESISTANT Resistant     IMIPENEM <=0.25 SENSITIVE Sensitive     TRIMETH/SULFA <=20 SENSITIVE Sensitive     PIP/TAZO Value in next row Sensitive      SENSITIVE<=4    * ESCHERICHIA COLI  Lactic acid, plasma     Status: None   Collection Time: 01/31/15 12:19 PM  Result Value Ref Range   Lactic Acid, Venous 1.0 0.5 - 2.0 mmol/L  Comprehensive metabolic panel     Status: Abnormal   Collection Time: 01/31/15 12:22 PM  Result Value Ref Range   Sodium 129 (L) 135 - 145 mmol/L   Potassium 3.8 3.5 - 5.1 mmol/L   Chloride 97 (L) 101 - 111 mmol/L   CO2 19 (L) 22 - 32 mmol/L   Glucose, Bld 145 (H) 65 - 99 mg/dL   BUN 12 6 - 20 mg/dL   Creatinine, Ser 1.13 (H) 0.44 - 1.00 mg/dL   Calcium 8.8 (L) 8.9 - 10.3 mg/dL   Total Protein 6.9 6.5 - 8.1 g/dL   Albumin 3.4 (L) 3.5 - 5.0 g/dL   AST 23 15 - 41 U/L   ALT 15 14 - 54 U/L   Alkaline Phosphatase 72 38 - 126 U/L   Total Bilirubin 1.2 0.3 - 1.2 mg/dL   GFR calc non Af Amer 41 (L) >60 mL/min   GFR calc Af Amer 48 (L) >60 mL/min    Comment: (NOTE) The eGFR has been calculated using the CKD EPI equation. This  calculation has not been validated in all clinical situations. eGFR's persistently <60 mL/min signify possible Chronic Kidney Disease.    Anion gap 13 5 - 15  Lipase, blood     Status: None   Collection Time: 01/31/15 12:22 PM  Result Value Ref Range   Lipase 26 22 - 51 U/L  Troponin I  Status: None   Collection Time: 01/31/15 12:22 PM  Result Value Ref Range   Troponin I <0.03 <0.031 ng/mL    Comment:        NO INDICATION OF MYOCARDIAL INJURY.   CBC WITH DIFFERENTIAL     Status: Abnormal   Collection Time: 01/31/15 12:22 PM  Result Value Ref Range   WBC 19.2 (H) 3.6 - 11.0 K/uL   RBC 3.89 3.80 - 5.20 MIL/uL   Hemoglobin 11.6 (L) 12.0 - 16.0 g/dL   HCT 34.8 (L) 35.0 - 47.0 %   MCV 89.5 80.0 - 100.0 fL   MCH 29.7 26.0 - 34.0 pg   MCHC 33.2 32.0 - 36.0 g/dL   RDW 14.6 (H) 11.5 - 14.5 %   Platelets 62 (L) 150 - 440 K/uL   Neutrophils Relative % 81.000000 %   Lymphocytes Relative 3.000000 %   Monocytes Relative 16.000000 %   Eosinophils Relative 0.000000 %   Basophils Relative 0.000000 %   Neutro Abs 15.5 (H) 1.4 - 6.5 K/uL   Lymphs Abs 0.6 (L) 1.0 - 3.6 K/uL   Monocytes Absolute 3.1 (H) 0.2 - 0.9 K/uL   Eosinophils Absolute 0.0 0 - 0.7 K/uL   Basophils Absolute 0.0 0 - 0.1 K/uL   Smear Review MORPHOLOGY UNREMARKABLE   APTT     Status: Abnormal   Collection Time: 01/31/15 12:22 PM  Result Value Ref Range   aPTT 43 (H) 24 - 36 seconds    Comment:        IF BASELINE aPTT IS ELEVATED, SUGGEST PATIENT RISK ASSESSMENT BE USED TO DETERMINE APPROPRIATE ANTICOAGULANT THERAPY.   Protime-INR     Status: Abnormal   Collection Time: 01/31/15 12:22 PM  Result Value Ref Range   Prothrombin Time 16.7 (H) 11.4 - 15.0 seconds   INR 1.33   Urinalysis complete, with microscopic ([facility] only)     Status: Abnormal   Collection Time: 01/31/15 12:22 PM  Result Value Ref Range   Color, Urine YELLOW (A) YELLOW   APPearance CLOUDY (A) CLEAR   Glucose, UA NEGATIVE NEGATIVE mg/dL    Bilirubin Urine NEGATIVE NEGATIVE   Ketones, ur NEGATIVE NEGATIVE mg/dL   Specific Gravity, Urine 1.008 1.005 - 1.030   Hgb urine dipstick 2+ (A) NEGATIVE   pH 6.0 5.0 - 8.0   Protein, ur 30 (A) NEGATIVE mg/dL   Nitrite NEGATIVE NEGATIVE   Leukocytes, UA 3+ (A) NEGATIVE   RBC / HPF 6-30 0 - 5 RBC/hpf   WBC, UA TOO NUMEROUS TO COUNT 0 - 5 WBC/hpf   Bacteria, UA RARE (A) NONE SEEN   Squamous Epithelial / LPF 0-5 (A) NONE SEEN   WBC Clumps PRESENT    Mucous PRESENT    Hyaline Casts, UA PRESENT   Urine culture     Status: None   Collection Time: 01/31/15 12:22 PM  Result Value Ref Range   Specimen Description URINE, CLEAN CATCH    Special Requests NONE    Culture >=100,000 COLONIES/mL ESCHERICHIA COLI    Report Status 02/02/2015 FINAL    Organism ID, Bacteria ESCHERICHIA COLI       Susceptibility   Escherichia coli - MIC*    AMPICILLIN >=32 RESISTANT Resistant     CEFAZOLIN <=4 SENSITIVE Sensitive     CEFTRIAXONE <=1 SENSITIVE Sensitive     CIPROFLOXACIN >=4 RESISTANT Resistant     GENTAMICIN >=16 RESISTANT Resistant     IMIPENEM <=0.25 SENSITIVE Sensitive     NITROFURANTOIN <=16 SENSITIVE  Sensitive     TRIMETH/SULFA <=20 SENSITIVE Sensitive     PIP/TAZO Value in next row Sensitive      SENSITIVE<=4    * >=100,000 COLONIES/mL ESCHERICHIA COLI  CBC     Status: Abnormal   Collection Time: 01/31/15  9:09 PM  Result Value Ref Range   WBC 24.8 (H) 3.6 - 11.0 K/uL   RBC 3.80 3.80 - 5.20 MIL/uL   Hemoglobin 11.1 (L) 12.0 - 16.0 g/dL   HCT 34.7 (L) 35.0 - 47.0 %   MCV 91.4 80.0 - 100.0 fL   MCH 29.3 26.0 - 34.0 pg   MCHC 32.0 32.0 - 36.0 g/dL   RDW 15.2 (H) 11.5 - 14.5 %   Platelets 58 (L) 150 - 440 K/uL  Creatinine, serum     Status: Abnormal   Collection Time: 01/31/15  9:09 PM  Result Value Ref Range   Creatinine, Ser 1.27 (H) 0.44 - 1.00 mg/dL   GFR calc non Af Amer 36 (L) >60 mL/min   GFR calc Af Amer 42 (L) >60 mL/min    Comment: (NOTE) The eGFR has been calculated  using the CKD EPI equation. This calculation has not been validated in all clinical situations. eGFR's persistently <60 mL/min signify possible Chronic Kidney Disease.   Basic metabolic panel     Status: Abnormal   Collection Time: 02/01/15  5:05 AM  Result Value Ref Range   Sodium 133 (L) 135 - 145 mmol/L   Potassium 3.6 3.5 - 5.1 mmol/L   Chloride 106 101 - 111 mmol/L   CO2 20 (L) 22 - 32 mmol/L   Glucose, Bld 154 (H) 65 - 99 mg/dL   BUN 15 6 - 20 mg/dL   Creatinine, Ser 1.21 (H) 0.44 - 1.00 mg/dL   Calcium 7.9 (L) 8.9 - 10.3 mg/dL   GFR calc non Af Amer 38 (L) >60 mL/min   GFR calc Af Amer 44 (L) >60 mL/min    Comment: (NOTE) The eGFR has been calculated using the CKD EPI equation. This calculation has not been validated in all clinical situations. eGFR's persistently <60 mL/min signify possible Chronic Kidney Disease.    Anion gap 7 5 - 15  CBC     Status: Abnormal   Collection Time: 02/01/15  5:05 AM  Result Value Ref Range   WBC 28.7 (H) 3.6 - 11.0 K/uL   RBC 3.38 (L) 3.80 - 5.20 MIL/uL   Hemoglobin 9.9 (L) 12.0 - 16.0 g/dL   HCT 30.5 (L) 35.0 - 47.0 %   MCV 90.3 80.0 - 100.0 fL   MCH 29.2 26.0 - 34.0 pg   MCHC 32.3 32.0 - 36.0 g/dL   RDW 14.7 (H) 11.5 - 14.5 %   Platelets 46 (L) 150 - 440 K/uL  CBC     Status: Abnormal   Collection Time: 02/02/15  4:08 AM  Result Value Ref Range   WBC 13.5 (H) 3.6 - 11.0 K/uL   RBC 3.44 (L) 3.80 - 5.20 MIL/uL   Hemoglobin 10.3 (L) 12.0 - 16.0 g/dL   HCT 31.0 (L) 35.0 - 47.0 %   MCV 90.2 80.0 - 100.0 fL   MCH 29.9 26.0 - 34.0 pg   MCHC 33.2 32.0 - 36.0 g/dL   RDW 14.7 (H) 11.5 - 14.5 %   Platelets 12 (LL) 150 - 440 K/uL    Comment: CRITICAL RESULT CALLED TO, READ BACK BY AND VERIFIED WITH: DAWN SONGTER _0  02/02/15 BY AJO   Creatinine, serum  Status: Abnormal   Collection Time: 02/02/15  4:08 AM  Result Value Ref Range   Creatinine, Ser 0.87 0.44 - 1.00 mg/dL   GFR calc non Af Amer 57 (L) >60 mL/min   GFR calc Af Amer  >60 >60 mL/min    Comment: (NOTE) The eGFR has been calculated using the CKD EPI equation. This calculation has not been validated in all clinical situations. eGFR's persistently <60 mL/min signify possible Chronic Kidney Disease.    Recent Results (from the past 240 hour(s))  Blood Culture (routine x 2)     Status: None   Collection Time: 01/31/15 11:20 AM  Result Value Ref Range Status   Specimen Description BLOOD RIGHT HAND  Final   Special Requests   Final    BOTTLES DRAWN AEROBIC AND ANAEROBIC 3CC AEROBIC,3CC ANAEROBIC   Culture  Setup Time   Final    GRAM NEGATIVE RODS IN BOTH AEROBIC AND ANAEROBIC BOTTLES CRITICAL RESULT CALLED TO, READ BACK BY AND VERIFIED WITH: RENICIA GRAVES 02/01/2015 0120 [place] CONFIRMED BY AJO    Culture   Final    ESCHERICHIA COLI IN BOTH AEROBIC AND ANAEROBIC BOTTLES    Report Status 02/02/2015 FINAL  Final   Organism ID, Bacteria ESCHERICHIA COLI  Final      Susceptibility   Escherichia coli - MIC*    AMPICILLIN >=32 RESISTANT Resistant     CEFTAZIDIME <=1 SENSITIVE Sensitive     CEFAZOLIN <=4 SENSITIVE Sensitive     CEFTRIAXONE <=1 SENSITIVE Sensitive     CIPROFLOXACIN >=4 RESISTANT Resistant     GENTAMICIN >=16 RESISTANT Resistant     IMIPENEM <=0.25 SENSITIVE Sensitive     TRIMETH/SULFA <=20 SENSITIVE Sensitive     PIP/TAZO Value in next row Sensitive      SENSITIVE<=4    * ESCHERICHIA COLI  Blood Culture (routine x 2)     Status: None   Collection Time: 01/31/15 11:25 AM  Result Value Ref Range Status   Specimen Description BLOOD LEFT ASSIST CONTROL  Final   Special Requests   Final    BOTTLES DRAWN AEROBIC AND ANAEROBIC 2CC AEROBIC,2CC ANAEROBIC   Culture  Setup Time   Final    GRAM NEGATIVE RODS IN BOTH AEROBIC AND ANAEROBIC BOTTLES CRITICAL RESULT CALLED TO, READ BACK BY AND VERIFIED WITH: RENICIA GRAVES 02/01/2015 0120 [place] CONFIRMED BY AJO    Culture   Final    ESCHERICHIA COLI IN BOTH AEROBIC AND ANAEROBIC BOTTLES     Report Status 02/02/2015 FINAL  Final   Organism ID, Bacteria ESCHERICHIA COLI  Final      Susceptibility   Escherichia coli - MIC*    AMPICILLIN >=32 RESISTANT Resistant     CEFTAZIDIME <=1 SENSITIVE Sensitive     CEFAZOLIN <=4 SENSITIVE Sensitive     CEFTRIAXONE <=1 SENSITIVE Sensitive     CIPROFLOXACIN >=4 RESISTANT Resistant     GENTAMICIN >=16 RESISTANT Resistant     IMIPENEM <=0.25 SENSITIVE Sensitive     TRIMETH/SULFA <=20 SENSITIVE Sensitive     PIP/TAZO Value in next row Sensitive      SENSITIVE<=4    * ESCHERICHIA COLI  Urine culture     Status: None   Collection Time: 01/31/15 12:22 PM  Result Value Ref Range Status   Specimen Description URINE, CLEAN CATCH  Final   Special Requests NONE  Final   Culture >=100,000 COLONIES/mL ESCHERICHIA COLI  Final   Report Status 02/02/2015 FINAL  Final   Organism ID, Bacteria ESCHERICHIA  COLI  Final      Susceptibility   Escherichia coli - MIC*    AMPICILLIN >=32 RESISTANT Resistant     CEFAZOLIN <=4 SENSITIVE Sensitive     CEFTRIAXONE <=1 SENSITIVE Sensitive     CIPROFLOXACIN >=4 RESISTANT Resistant     GENTAMICIN >=16 RESISTANT Resistant     IMIPENEM <=0.25 SENSITIVE Sensitive     NITROFURANTOIN <=16 SENSITIVE Sensitive     TRIMETH/SULFA <=20 SENSITIVE Sensitive     PIP/TAZO Value in next row Sensitive      SENSITIVE<=4    * >=100,000 COLONIES/mL ESCHERICHIA COLI   Creatinine:  Recent Labs  01/31/15 1222 01/31/15 2109 02/01/15 0505 02/02/15 0408  CREATININE 1.13* 1.27* 1.21* 0.87   Baseline Creatinine: unkown  Impression/Assessment:  81yo with urinary retention and bilateral hydronephrosis  Recs: 1. Please place foley catheter and drain 1L every hour until the bladder is empty. The patient is at risk for post obstructive diuresis so please cycle electrolytes and strict I&Os. 2. Rocephin 1g daily  3. Hydronephrosis likely related to urinary retention. Please obtain renal US in 72 hours to assess  hydronephrosis  [name] L 02/02/2015, 6:54 PM

## 2015-02-02 NOTE — Progress Notes (Addendum)
[facility] [place] NAME: [name]    MR#:  [id number]  DATE OF BIRTH:  [birth year]  SUBJECTIVE:  No acute events overnight. Blood cultures were positive for Escherichia coli, low-grade fever up to 100.9 REVIEW OF SYSTEMS:    Review of Systems  Constitutional: Positive for fever. Negative for chills and malaise/fatigue.  HENT: Negative for sore throat.   Eyes: Negative for blurred vision.  Respiratory: Negative for cough, hemoptysis, shortness of breath and wheezing.   Cardiovascular: Negative for chest pain, palpitations and leg swelling.  Gastrointestinal: Negative for nausea, vomiting, abdominal pain, diarrhea and blood in stool.  Genitourinary: Positive for dysuria. Negative for urgency, frequency, hematuria and flank pain.  Musculoskeletal: Negative for back pain.  Neurological: Negative for dizziness, tremors and headaches.  Endo/Heme/Allergies: Does not bruise/bleed easily.    Tolerating Diet:Yes DRUG ALLERGIES:   Allergies  Allergen Reactions  . Atenolol Other (See Comments)    Reaction:  Complete Heart Block  . Penicillins Other (See Comments)    Reaction:  Unknown   . Dilaudid [Hydromorphone Hcl] Rash   VITALS:  Blood pressure 158/67, pulse 85, temperature 98.3 F (36.8 C), temperature source Oral, resp. rate 18, height 5\' 1"  (1.549 m), weight 53.978 kg (119 lb), SpO2 97 %. PHYSICAL EXAMINATION:  Physical Exam  Constitutional: She is oriented to person, place, and time and well-developed, well-nourished, and in no distress. No distress.  HENT:  Head: Normocephalic.  Eyes: No scleral icterus.  Neck: Normal range of motion. Neck supple. No JVD present. No tracheal deviation present.  Cardiovascular: Normal rate, regular rhythm and normal heart sounds.  Exam reveals no gallop and no friction rub.   No murmur heard. Pulmonary/Chest: Effort normal and breath sounds normal. No respiratory distress. She has no  wheezes. She has no rales. She exhibits no tenderness.  Abdominal: Soft. Bowel sounds are normal. She exhibits no distension and no mass. There is no tenderness. There is no rebound and no guarding.  Musculoskeletal: Normal range of motion. She exhibits no edema.  Neurological: She is alert and oriented to person, place, and time.  Skin: Skin is warm. No rash noted. No erythema.  Psychiatric: Affect and judgment normal.   LABORATORY PANEL:   CBC  Recent Labs Lab 02/02/15 0408  WBC 13.5*  HGB 10.3*  HCT 31.0*  PLT 12*   ------------------------------------------------------------------------------------------------------------------  Chemistries   Recent Labs Lab 01/31/15 1222  02/01/15 0505 02/02/15 0408  NA 129*  --  133*  --   K 3.8  --  3.6  --   CL 97*  --  106  --   CO2 19*  --  20*  --   GLUCOSE 145*  --  154*  --   BUN 12  --  15  --   CREATININE 1.13*  < > 1.21* 0.87  CALCIUM 8.8*  --  7.9*  --   AST 23  --   --   --   ALT 15  --   --   --   ALKPHOS 72  --   --   --   BILITOT 1.2  --   --   --   < > = values in this interval not displayed.  RADIOLOGY:  Dg Chest Port 1 View  01/31/2015   CLINICAL DATA:  Fever. Tachycardia. Recurrent urinary tract infections.  EXAM: PORTABLE CHEST - 1 VIEW  COMPARISON:  01/10/2015  FINDINGS: Mild cardiomegaly and ectasia  of the thoracic aorta are stable. Dual lead transvenous pacemaker remains in appropriate position.  Both lungs are clear. No evidence of pulmonary edema or infiltrate. No evidence of pneumothorax or pleural effusion.  IMPRESSION: Stable mild cardiomegaly.  No acute findings.   Electronically Signed   By: Earle Gell M.D.   On: 01/31/2015 14:02   Ct Renal Stone Study  02/01/2015   CLINICAL DATA:  Nocturia, urinary frequency and dysuria. Hip and back pain. Fevers. Recent urinary tract infection.  EXAM: CT ABDOMEN AND PELVIS WITHOUT CONTRAST  TECHNIQUE: Multidetector CT imaging of the abdomen and pelvis was performed  following the standard protocol without IV contrast.  COMPARISON:  01/13/2013  FINDINGS: Lower chest: Small bilateral pleural effusions noted. Calcified granuloma is identified within the right lower lobe.  Hepatobiliary: Low attenuation foci within the liver are again noted and similar tooth 2014 examination and likely represent benign cysts. Previous cholecystectomy. Chronic increase caliber of the common bile duct is identified measuring up to 7 mm.  Pancreas: The pancreas is unremarkable.  Spleen: Multiple splenic granulomas identified.  Adrenals/Urinary Tract: Chronic enlargement of the adrenal glands appear similar to previous study. Low lying and mi rotated right kidney is noted. There is asymmetric left-sided hydronephrosis, nephro megaly and perinephric fat stranding. No obstructing stone identified. There is moderate distension of the urinary bladder.  Stomach/Bowel: The stomach appears normal. The small bowel loops have a normal caliber. No obstruction. Unremarkable appearance of the colon.  Vascular/Lymphatic: There is aortic atherosclerosis and tortuosity. No aneurysm. No upper abdominal adenopathy noted. No pelvic or inguinal adenopathy noted.  Reproductive: The uterus and the adnexal structures are within normal limits.  Other: No free fluid or fluid collections identified.  Musculoskeletal: Previous left hip arthroplasty. Scoliosis and degenerative disc disease noted within the lumbar spine. Compression fracture involving the T12 vertebra is identified and is new from previous examination.  IMPRESSION: 1. Examination is positive for left-sided hydronephrosis, nephromegaly and perinephric fat stranding. Findings may be a manifestation of obstructive uropathy and/or pyelonephritis. No obstructing stone identified. No obstructing stone identified. 2. Aortic atherosclerosis 3. Prior granulomatous disease 4. Osteopenia and lumbar spondylosis. 5. New T12 compression fracture is age indeterminate. 6. New  bilateral pleural effusions.   Electronically Signed   By: Kerby Moors M.D.   On: 02/01/2015 17:20   ASSESSMENT AND PLAN:   This is a very pleasant 81 year old female with a history of essential hypertension and recent Escherichia coli bacteremia secondary to urinary tract infection who presents again with sepsis secondary to urinary tract infection and gram-negative rod bacteremia.  1.  Escherichia coli Sepsis: This is likely secondary to urinary tract infection. She was recently treated for Escherichia coli sepsis and bacteremia due to urinary tract infection and she presents again with similar complaint.  Appreciate infectious disease - CT shows left-sided hydronephrosis, possible obstructive uropathy and/or pyelonephritis.  Consult urology.  Antibiotic changed to meropenem per infectious disease  2.  New T12 compression fracture: Per CT scan, although talking with patient, it looks like maybe an old injury.  We will consult orthopedic for further evaluation.  She does not seem to be in acute pain  3. Thrombocytopenia: Acute on chronic. Follow platelet count. It is trending down - can be due to sepsis.  avoid antiplatelet agents.  Consult oncology   4. Leukocytosis: CBC is trending down with change in Abx (meropenem)  5. Essential hypertension: Continue Norvasc  Management plans discussed with the patient and she  is in agreement.  CODE STATUS: DNR (d/w daughter at bedside)  TOTAL TIME TAKING CARE OF THIS PATIENT: 30 minutes.    POSSIBLE D/C 2-3 days, DEPENDING ON CLINICAL CONDITION.   [facility], Aidan Caloca M.D on 02/02/2015 at 8:53 AM  Between 7am to 6pm - Pager - [phone] After 6pm go to [email or web address] - password [email or web address] [facility]  Office  [phone]  CC: Primary care physician; Ivor Reining, MD

## 2015-02-03 LAB — BASIC METABOLIC PANEL
Anion gap: 5 (ref 5–15)
BUN: 10 mg/dL (ref 6–20)
CHLORIDE: 107 mmol/L (ref 101–111)
CO2: 23 mmol/L (ref 22–32)
CREATININE: 0.79 mg/dL (ref 0.44–1.00)
Calcium: 8.3 mg/dL — ABNORMAL LOW (ref 8.9–10.3)
Glucose, Bld: 111 mg/dL — ABNORMAL HIGH (ref 65–99)
POTASSIUM: 3.1 mmol/L — AB (ref 3.5–5.1)
SODIUM: 135 mmol/L (ref 135–145)

## 2015-02-03 LAB — CBC
HCT: 31.5 % — ABNORMAL LOW (ref 35.0–47.0)
HEMOGLOBIN: 10.9 g/dL — AB (ref 12.0–16.0)
MCH: 30.9 pg (ref 26.0–34.0)
MCHC: 34.5 g/dL (ref 32.0–36.0)
MCV: 89.6 fL (ref 80.0–100.0)
PLATELETS: 27 10*3/uL — AB (ref 150–440)
RBC: 3.52 MIL/uL — AB (ref 3.80–5.20)
RDW: 14.9 % — ABNORMAL HIGH (ref 11.5–14.5)
WBC: 9.9 10*3/uL (ref 3.6–11.0)

## 2015-02-03 LAB — FIBRINOGEN: Fibrinogen: 643 mg/dL — ABNORMAL HIGH (ref 210–470)

## 2015-02-03 LAB — FOLATE: Folate: 20.2 ng/mL (ref >5.9)

## 2015-02-03 LAB — VITAMIN B12: Vitamin B-12: 284 pg/mL (ref 180–914)

## 2015-02-03 LAB — ABO/RH: ABO/RH(D): AB POS

## 2015-02-03 MED ORDER — SENNOSIDES-DOCUSATE SODIUM 8.6-50 MG PO TABS
2.0000 | ORAL_TABLET | Freq: Two times a day (BID) | ORAL | Status: DC
  Administered 2015-02-03 – 2015-02-06 (×5): 2 via ORAL
  Filled 2015-02-03 (×9): qty 2

## 2015-02-03 MED ORDER — POTASSIUM CHLORIDE CRYS ER 20 MEQ PO TBCR
40.0000 meq | EXTENDED_RELEASE_TABLET | Freq: Once | ORAL | Status: AC
  Administered 2015-02-03: 40 meq via ORAL
  Filled 2015-02-03: qty 2

## 2015-02-03 NOTE — Care Management (Signed)
Spoke with patient who is alert and oriented from  home with daughter. Patient stated that she has Rolator and rolling walker at home and lives in a one level condo with her adult daughter who takes "very good care of her" .  She stated that her daughter drives her to appointments and is retired so she is in the home all the time. No CM needs identified at this time continue to follow.

## 2015-02-03 NOTE — Progress Notes (Signed)
[facility] INFECTIOUS DISEASE PROGRESS NOTE Date of Admission:  01/31/2015     ID: [name] is a 81 y.o. female with UTI, hydronephrosis and pyelnephritis  Active Problems:   UTI (lower urinary tract infection)   Subjective: Still with fevers. Good uop.   ROS  Eleven systems are reviewed and negative except per hpi  Medications:  Antibiotics Given (last 72 hours)    Date/Time Action Medication Dose Rate   02/01/15 0942 Given   cefTRIAXone (ROCEPHIN) 1 g in dextrose 5 % 50 mL IVPB 1 g 100 mL/hr   02/01/15 1715 Given   meropenem (MERREM) 500 mg in sodium chloride 0.9 % 50 mL IVPB 500 mg 100 mL/hr   02/02/15 0640 Given   meropenem (MERREM) 500 mg in sodium chloride 0.9 % 50 mL IVPB 500 mg 100 mL/hr   02/02/15 1525 Given   cefTRIAXone (ROCEPHIN) 1 g in dextrose 5 % 50 mL IVPB 1 g 100 mL/hr   02/03/15 1358 Given   cefTRIAXone (ROCEPHIN) 1 g in dextrose 5 % 50 mL IVPB 1 g 100 mL/hr     . amLODipine  5 mg Oral Daily  . cefTRIAXone (ROCEPHIN)  IV  1 g Intravenous Q24H  . multivitamin-lutein  1 capsule Oral Daily  . senna-docusate  2 tablet Oral BID    Objective: Vital signs in last 24 hours: Temp:  [98.4 F (36.9 C)-102.6 F (39.2 C)] 102.6 F (39.2 C) (09/02 1444) Pulse Rate:  [81-95] 95 (09/02 1112) Resp:  [12-24] 20 (09/02 1112) BP: (122-151)/(47-73) 151/69 mmHg (09/02 1112) SpO2:  [93 %-97 %] 96 % (09/02 1112) Constitutional: oriented to person, place, and time. HOH frail No distress.  HENT: [place]/AT, PERRLA, no scleral icterus Mouth/Throat: Oropharynx is clear and moist. No oropharyngeal exudate.  Cardiovascular: Normal rate, regular rhythm and normal heart sounds.2/ 6 sm PPM site wnl Pulmonary/Chest: Effort normal and breath sounds normal. No respiratory distress. has no wheezes.  Neck = supple, no nuchal rigidity Abdominal: Soft. Bowel sounds are normal. exhibits no distension. There is no tenderness.  Lymphadenopathy: no cervical adenopathy. No axillary  adenopathy Neurological: alert and oriented to person, place, and time.  Skin: Skin is warm and dry. No rash noted. No erythema.  Foley in place  Lab Results  Recent Labs  02/01/15 0505 02/02/15 0408 02/03/15 0535  WBC 28.7* 13.5* 9.9  HGB 9.9* 10.3* 10.9*  HCT 30.5* 31.0* 31.5*  NA 133*  --  135  K 3.6  --  3.1*  CL 106  --  107  CO2 20*  --  23  BUN 15  --  10  CREATININE 1.21* 0.87 0.79    Microbiology: Results for orders placed or performed during the hospital encounter of 01/31/15  Blood Culture (routine x 2)     Status: None   Collection Time: 01/31/15 11:20 AM  Result Value Ref Range Status   Specimen Description BLOOD RIGHT HAND  Final   Special Requests   Final    BOTTLES DRAWN AEROBIC AND ANAEROBIC 3CC AEROBIC,3CC ANAEROBIC   Culture  Setup Time   Final    GRAM NEGATIVE RODS IN BOTH AEROBIC AND ANAEROBIC BOTTLES CRITICAL RESULT CALLED TO, READ BACK BY AND VERIFIED WITH: RENICIA GRAVES 02/01/2015 0120 [place] CONFIRMED BY AJO    Culture   Final    ESCHERICHIA COLI IN BOTH AEROBIC AND ANAEROBIC BOTTLES    Report Status 02/02/2015 FINAL  Final   Organism ID, Bacteria ESCHERICHIA COLI  Final  Susceptibility   Escherichia coli - MIC*    AMPICILLIN >=32 RESISTANT Resistant     CEFTAZIDIME <=1 SENSITIVE Sensitive     CEFAZOLIN <=4 SENSITIVE Sensitive     CEFTRIAXONE <=1 SENSITIVE Sensitive     CIPROFLOXACIN >=4 RESISTANT Resistant     GENTAMICIN >=16 RESISTANT Resistant     IMIPENEM <=0.25 SENSITIVE Sensitive     TRIMETH/SULFA <=20 SENSITIVE Sensitive     PIP/TAZO Value in next row Sensitive      SENSITIVE<=4    * ESCHERICHIA COLI  Blood Culture (routine x 2)     Status: None   Collection Time: 01/31/15 11:25 AM  Result Value Ref Range Status   Specimen Description BLOOD LEFT ASSIST CONTROL  Final   Special Requests   Final    BOTTLES DRAWN AEROBIC AND ANAEROBIC 2CC AEROBIC,2CC ANAEROBIC   Culture  Setup Time   Final    GRAM NEGATIVE RODS IN BOTH  AEROBIC AND ANAEROBIC BOTTLES CRITICAL RESULT CALLED TO, READ BACK BY AND VERIFIED WITH: RENICIA GRAVES 02/01/2015 0120 [place] CONFIRMED BY AJO    Culture   Final    ESCHERICHIA COLI IN BOTH AEROBIC AND ANAEROBIC BOTTLES    Report Status 02/02/2015 FINAL  Final   Organism ID, Bacteria ESCHERICHIA COLI  Final      Susceptibility   Escherichia coli - MIC*    AMPICILLIN >=32 RESISTANT Resistant     CEFTAZIDIME <=1 SENSITIVE Sensitive     CEFAZOLIN <=4 SENSITIVE Sensitive     CEFTRIAXONE <=1 SENSITIVE Sensitive     CIPROFLOXACIN >=4 RESISTANT Resistant     GENTAMICIN >=16 RESISTANT Resistant     IMIPENEM <=0.25 SENSITIVE Sensitive     TRIMETH/SULFA <=20 SENSITIVE Sensitive     PIP/TAZO Value in next row Sensitive      SENSITIVE<=4    * ESCHERICHIA COLI  Urine culture     Status: None   Collection Time: 01/31/15 12:22 PM  Result Value Ref Range Status   Specimen Description URINE, CLEAN CATCH  Final   Special Requests NONE  Final   Culture >=100,000 COLONIES/mL ESCHERICHIA COLI  Final   Report Status 02/02/2015 FINAL  Final   Organism ID, Bacteria ESCHERICHIA COLI  Final      Susceptibility   Escherichia coli - MIC*    AMPICILLIN >=32 RESISTANT Resistant     CEFAZOLIN <=4 SENSITIVE Sensitive     CEFTRIAXONE <=1 SENSITIVE Sensitive     CIPROFLOXACIN >=4 RESISTANT Resistant     GENTAMICIN >=16 RESISTANT Resistant     IMIPENEM <=0.25 SENSITIVE Sensitive     NITROFURANTOIN <=16 SENSITIVE Sensitive     TRIMETH/SULFA <=20 SENSITIVE Sensitive     PIP/TAZO Value in next row Sensitive      SENSITIVE<=4    * >=100,000 COLONIES/mL ESCHERICHIA COLI  Urine culture     Status: None (Preliminary result)   Collection Time: 02/02/15  2:11 PM  Result Value Ref Range Status   Specimen Description URINE, RANDOM  Final   Special Requests NONE  Final   Culture NO GROWTH < 24 HOURS  Final   Report Status PENDING  Incomplete    Studies/Results: Ct Renal Stone Study  02/01/2015   CLINICAL DATA:   Nocturia, urinary frequency and dysuria. Hip and back pain. Fevers. Recent urinary tract infection.  EXAM: CT ABDOMEN AND PELVIS WITHOUT CONTRAST  TECHNIQUE: Multidetector CT imaging of the abdomen and pelvis was performed following the standard protocol without IV contrast.  COMPARISON:  01/13/2013  FINDINGS: Lower chest: Small bilateral pleural effusions noted. Calcified granuloma is identified within the right lower lobe.  Hepatobiliary: Low attenuation foci within the liver are again noted and similar tooth 2014 examination and likely represent benign cysts. Previous cholecystectomy. Chronic increase caliber of the common bile duct is identified measuring up to 7 mm.  Pancreas: The pancreas is unremarkable.  Spleen: Multiple splenic granulomas identified.  Adrenals/Urinary Tract: Chronic enlargement of the adrenal glands appear similar to previous study. Low lying and mi rotated right kidney is noted. There is asymmetric left-sided hydronephrosis, nephro megaly and perinephric fat stranding. No obstructing stone identified. There is moderate distension of the urinary bladder.  Stomach/Bowel: The stomach appears normal. The small bowel loops have a normal caliber. No obstruction. Unremarkable appearance of the colon.  Vascular/Lymphatic: There is aortic atherosclerosis and tortuosity. No aneurysm. No upper abdominal adenopathy noted. No pelvic or inguinal adenopathy noted.  Reproductive: The uterus and the adnexal structures are within normal limits.  Other: No free fluid or fluid collections identified.  Musculoskeletal: Previous left hip arthroplasty. Scoliosis and degenerative disc disease noted within the lumbar spine. Compression fracture involving the T12 vertebra is identified and is new from previous examination.  IMPRESSION: 1. Examination is positive for left-sided hydronephrosis, nephromegaly and perinephric fat stranding. Findings may be a manifestation of obstructive uropathy and/or pyelonephritis.  No obstructing stone identified. No obstructing stone identified. 2. Aortic atherosclerosis 3. Prior granulomatous disease 4. Osteopenia and lumbar spondylosis. 5. New T12 compression fracture is age indeterminate. 6. New bilateral pleural effusions.   Electronically Signed   By: Kerby Moors M.D.   On: 02/01/2015 17:20    Assessment/Plan: Jisel Fleet is a 81 y.o. female readmitted with recurrent UTI and E coli sepsis. No prior hx of recurrent UTIs or other urological issues.  CT stone protocol shows L hydro, nephromegaly and perinephric fat stranding.  No stone identified so unclear cause of obstruction but may be from bladder distention.    WBC is down to 9 but still febrile. Plts improving  ID issues E coli Bacteremia- recent E coli bacteremia E coli UTI - recent E coli UTI Pyelonephritis with L hydronephrosis Fever/leukocytosis TCP PPM and L THR as prosthetic material   Recommendations Urology consult noted - foley placed Cont ceftriaxone while inpatient - will need a 14 day course of abx for the Pyelonephritis (from when the foley cath was placed) - if improving can be changed to oral bactrim DS bid to complete the course Will need to FU with urology. I do NOT need to see in follow up unless a new resistant infection recurs. Thank you very much for the consult.  I will sign off now but please call with questions  [place], [place]   02/03/2015, 3:22 PM

## 2015-02-03 NOTE — Progress Notes (Signed)
[facility] at [place] NAME: [name]    MR#:  [id number]  DATE OF BIRTH:  1923/05/03  SUBJECTIVE:  much better, good urine output since Foley catheter placed yesterday REVIEW OF SYSTEMS:    Review of Systems  Constitutional: Positive for fever. Negative for chills and malaise/fatigue.  HENT: Negative for sore throat.   Eyes: Negative for blurred vision.  Respiratory: Negative for cough, hemoptysis, shortness of breath and wheezing.   Cardiovascular: Negative for chest pain, palpitations and leg swelling.  Gastrointestinal: Negative for nausea, vomiting, abdominal pain, diarrhea and blood in stool.  Genitourinary: Positive for dysuria. Negative for urgency, frequency, hematuria and flank pain.  Musculoskeletal: Negative for back pain.  Neurological: Negative for dizziness, tremors and headaches.  Endo/Heme/Allergies: Does not bruise/bleed easily.    Tolerating Diet:Yes DRUG ALLERGIES:   Allergies  Allergen Reactions  . Atenolol Other (See Comments)    Reaction:  Complete Heart Block  . Penicillins Other (See Comments)    Reaction:  Unknown   . Dilaudid [Hydromorphone Hcl] Rash   VITALS:  Blood pressure 151/69, pulse 95, temperature 99.6 F (37.6 C), temperature source Oral, resp. rate 20, height 5\' 1"  (1.549 m), weight 53.978 kg (119 lb), SpO2 96 %. PHYSICAL EXAMINATION:  Physical Exam  Constitutional: She is oriented to person, place, and time and well-developed, well-nourished, and in no distress. No distress.  HENT:  Head: Normocephalic.  Eyes: No scleral icterus.  Neck: Normal range of motion. Neck supple. No JVD present. No tracheal deviation present.  Cardiovascular: Normal rate, regular rhythm and normal heart sounds.  Exam reveals no gallop and no friction rub.   No murmur heard. Pulmonary/Chest: Effort normal and breath sounds normal. No respiratory distress. She has no wheezes. She has no rales. She exhibits  no tenderness.  Abdominal: Soft. Bowel sounds are normal. She exhibits no distension and no mass. There is no tenderness. There is no rebound and no guarding.  Musculoskeletal: Normal range of motion. She exhibits no edema.  Neurological: She is alert and oriented to person, place, and time.  Skin: Skin is warm. No rash noted. No erythema.  Psychiatric: Affect and judgment normal.   LABORATORY PANEL:   CBC  Recent Labs Lab 02/03/15 0535  WBC 9.9  HGB 10.9*  HCT 31.5*  PLT 27*   ------------------------------------------------------------------------------------------------------------------  Chemistries   Recent Labs Lab 01/31/15 1222  02/03/15 0535  NA 129*  < > 135  K 3.8  < > 3.1*  CL 97*  < > 107  CO2 19*  < > 23  GLUCOSE 145*  < > 111*  BUN 12  < > 10  CREATININE 1.13*  < > 0.79  CALCIUM 8.8*  < > 8.3*  AST 23  --   --   ALT 15  --   --   ALKPHOS 72  --   --   BILITOT 1.2  --   --   < > = values in this interval not displayed.  RADIOLOGY:  Ct Renal Stone Study  02/01/2015   CLINICAL DATA:  Nocturia, urinary frequency and dysuria. Hip and back pain. Fevers. Recent urinary tract infection.  EXAM: CT ABDOMEN AND PELVIS WITHOUT CONTRAST  TECHNIQUE: Multidetector CT imaging of the abdomen and pelvis was performed following the standard protocol without IV contrast.  COMPARISON:  01/13/2013  FINDINGS: Lower chest: Small bilateral pleural effusions noted. Calcified granuloma is identified within the right lower lobe.  Hepatobiliary: Low  attenuation foci within the liver are again noted and similar tooth 2014 examination and likely represent benign cysts. Previous cholecystectomy. Chronic increase caliber of the common bile duct is identified measuring up to 7 mm.  Pancreas: The pancreas is unremarkable.  Spleen: Multiple splenic granulomas identified.  Adrenals/Urinary Tract: Chronic enlargement of the adrenal glands appear similar to previous study. Low lying and mi rotated  right kidney is noted. There is asymmetric left-sided hydronephrosis, nephro megaly and perinephric fat stranding. No obstructing stone identified. There is moderate distension of the urinary bladder.  Stomach/Bowel: The stomach appears normal. The small bowel loops have a normal caliber. No obstruction. Unremarkable appearance of the colon.  Vascular/Lymphatic: There is aortic atherosclerosis and tortuosity. No aneurysm. No upper abdominal adenopathy noted. No pelvic or inguinal adenopathy noted.  Reproductive: The uterus and the adnexal structures are within normal limits.  Other: No free fluid or fluid collections identified.  Musculoskeletal: Previous left hip arthroplasty. Scoliosis and degenerative disc disease noted within the lumbar spine. Compression fracture involving the T12 vertebra is identified and is new from previous examination.  IMPRESSION: 1. Examination is positive for left-sided hydronephrosis, nephromegaly and perinephric fat stranding. Findings may be a manifestation of obstructive uropathy and/or pyelonephritis. No obstructing stone identified. No obstructing stone identified. 2. Aortic atherosclerosis 3. Prior granulomatous disease 4. Osteopenia and lumbar spondylosis. 5. New T12 compression fracture is age indeterminate. 6. New bilateral pleural effusions.   Electronically Signed   By: Kerby Moors M.D.   On: 02/01/2015 17:20   ASSESSMENT AND PLAN:   This is a very pleasant 81 year old female with a history of essential hypertension and recent Escherichia coli bacteremia secondary to urinary tract infection who presents again with sepsis secondary to urinary tract infection and gram-negative rod bacteremia.  1.  Escherichia coli Sepsis: This is likely secondary to urinary tract infection.  Changed to IV Rocephin.  Appreciate infectious disease input  2.  New T12 compression fracture: Appreciate orthopedic input.  No intervention required.  3. Thrombocytopenia: Acute on chronic.   Appreciate oncology input, his platelet went from 12->6 last night and required 1 unit of platelet transfusion now it's up to 27  4. Leukocytosis: CBC is trending down  5. Essential hypertension: Continue Norvasc  6.  Hypokalemia: Replete and recheck, check Magnesium  7.  Hydronephrosis and urinary retention: Appreciate urology input, Foley is draining well, consider ultrasound in 48 hours   Consult physical therapy  Management plans discussed with the patient and she  is in agreement.  CODE STATUS: DNR (d/w daughter at bedside)  TOTAL TIME TAKING CARE OF THIS PATIENT: 30 minutes.    POSSIBLE D/C 1-2 days, DEPENDING ON CLINICAL CONDITION.   [facility], Laron Boorman M.D on 02/03/2015 at 1:46 PM  Between 7am to 6pm - Pager - [phone] After 6pm go to [email or web address] - password [email or web address] [facility]  [facility]  Office  [phone]  CC: Primary care physician; Tommi Rumps, MD

## 2015-02-03 NOTE — Progress Notes (Signed)
Dr. Mike Gip called 9/1 @ 2300 and asked to have platelet count checked 1 hour after transfusion.  I told her to put in order for lab.  I will let lab know when transfusion is done.  Christene Slates  02/03/2015  12:15 AM

## 2015-02-03 NOTE — Consult Note (Signed)
Patient is seen for evaluation of a T12 compression fracture noted on CT scan during this admission. She reports no significant back pain no change in back pain. She denies any history of trauma. Currently is admitted with urinary tract infection and was being checked for kidney stone  On exam she has no clonus negative straight leg raising bilaterally sensation is intact in lower extremities she has no pain with hip motion and has normal strength. On palpation of the back there is a slight kyphotic deformity at the level of T12 but she is completely nontender to percussion.  CT scan shows significant compression of T12 age indeterminant.  Impression is T12 compression fracture completely healed and asymptomatic  Recommendation his activities as tolerated and not to worry about this return and for follow-up as outpatient if she develops increasing back pain in the future

## 2015-02-04 ENCOUNTER — Inpatient Hospital Stay
Admit: 2015-02-04 | Discharge: 2015-02-04 | Disposition: A | Discharge status: Home / Self Care | Payer: Medicare Other | Attending: Internal Medicine | Admitting: Internal Medicine

## 2015-02-04 DIAGNOSIS — R509 Fever, unspecified: Secondary | ICD-10-CM

## 2015-02-04 DIAGNOSIS — R339 Retention of urine, unspecified: Secondary | ICD-10-CM | POA: Diagnosis not present

## 2015-02-04 DIAGNOSIS — N390 Urinary tract infection, site not specified: Secondary | ICD-10-CM

## 2015-02-04 DIAGNOSIS — N1330 Unspecified hydronephrosis: Secondary | ICD-10-CM | POA: Diagnosis not present

## 2015-02-04 LAB — CBC WITH DIFFERENTIAL/PLATELET
Basophils Absolute: 0 10*3/uL (ref 0–0.1)
Basophils Relative: 0 %
Eosinophils Absolute: 0.1 10*3/uL (ref 0–0.7)
Eosinophils Relative: 1 %
HCT: 33.6 % — ABNORMAL LOW (ref 35.0–47.0)
Hemoglobin: 11.3 g/dL — ABNORMAL LOW (ref 12.0–16.0)
Lymphocytes Relative: 10 %
Lymphs Abs: 1 10*3/uL (ref 1.0–3.6)
MCH: 30.2 pg (ref 26.0–34.0)
MCHC: 33.8 g/dL (ref 32.0–36.0)
MCV: 89.4 fL (ref 80.0–100.0)
Monocytes Absolute: 2.2 10*3/uL — ABNORMAL HIGH (ref 0.2–0.9)
Monocytes Relative: 22 %
Neutro Abs: 6.8 10*3/uL — ABNORMAL HIGH (ref 1.4–6.5)
Neutrophils Relative %: 67 %
Platelets: 7 10*3/uL — CL (ref 150–440)
RBC: 3.75 MIL/uL — ABNORMAL LOW (ref 3.80–5.20)
RDW: 15.1 % — ABNORMAL HIGH (ref 11.5–14.5)
WBC: 10.1 10*3/uL (ref 3.6–11.0)

## 2015-02-04 LAB — BASIC METABOLIC PANEL
ANION GAP: 4 — AB (ref 5–15)
BUN: 9 mg/dL (ref 6–20)
CO2: 23 mmol/L (ref 22–32)
Calcium: 8 mg/dL — ABNORMAL LOW (ref 8.9–10.3)
Chloride: 107 mmol/L (ref 101–111)
Creatinine, Ser: 0.77 mg/dL (ref 0.44–1.00)
Glucose, Bld: 127 mg/dL — ABNORMAL HIGH (ref 65–99)
POTASSIUM: 3.5 mmol/L (ref 3.5–5.1)
SODIUM: 134 mmol/L — AB (ref 135–145)

## 2015-02-04 LAB — URINE CULTURE: CULTURE: NO GROWTH

## 2015-02-04 LAB — PREPARE PLATELET PHERESIS: Unit division: 0

## 2015-02-04 LAB — MAGNESIUM: MAGNESIUM: 1.8 mg/dL (ref 1.7–2.4)

## 2015-02-04 MED ORDER — SODIUM CHLORIDE 0.9 % IV SOLN
Freq: Once | INTRAVENOUS | Status: AC
  Administered 2015-02-04: 13:00:00 via INTRAVENOUS

## 2015-02-04 MED ORDER — ACETAMINOPHEN 325 MG PO TABS
650.0000 mg | ORAL_TABLET | Freq: Once | ORAL | Status: AC
  Administered 2015-02-04: 650 mg via ORAL
  Filled 2015-02-04: qty 2

## 2015-02-04 MED ORDER — DIPHENHYDRAMINE HCL 25 MG PO CAPS
25.0000 mg | ORAL_CAPSULE | Freq: Once | ORAL | Status: AC
  Administered 2015-02-04: 25 mg via ORAL
  Filled 2015-02-04: qty 1

## 2015-02-04 NOTE — Progress Notes (Signed)
*  PRELIMINARY RESULTS* Echocardiogram 2D Echocardiogram has been performed.  Lavell Luster Stills 02/04/2015, 8:36 AM

## 2015-02-04 NOTE — Progress Notes (Signed)
[facility] [place] NAME: [name]    MR#:  [id number]  DATE OF BIRTH:  1923-01-09  SUBJECTIVE:  feeling better.  Spiking fever up to 101.8, platelet count down to 7 REVIEW OF SYSTEMS:    Review of Systems  Constitutional: Positive for fever. Negative for chills and malaise/fatigue.  HENT: Negative for sore throat.   Eyes: Negative for blurred vision.  Respiratory: Negative for cough, hemoptysis, shortness of breath and wheezing.   Cardiovascular: Negative for chest pain, palpitations and leg swelling.  Gastrointestinal: Negative for nausea, vomiting, abdominal pain, diarrhea and blood in stool.  Genitourinary: Positive for dysuria. Negative for urgency, frequency, hematuria and flank pain.  Musculoskeletal: Negative for back pain.  Neurological: Negative for dizziness, tremors and headaches.  Endo/Heme/Allergies: Does not bruise/bleed easily.    Tolerating Diet:Yes DRUG ALLERGIES:   Allergies  Allergen Reactions  . Atenolol Other (See Comments)    Reaction:  Complete Heart Block  . Penicillins Other (See Comments)    Reaction:  Unknown   . Dilaudid [Hydromorphone Hcl] Rash   VITALS:  Blood pressure 111/66, pulse 100, temperature 98.5 F (36.9 C), temperature source Oral, resp. rate 18, height 5\' 1"  (1.549 m), weight 53.978 kg (119 lb), SpO2 97 %. PHYSICAL EXAMINATION:  Physical Exam  Constitutional: She is oriented to person, place, and time and well-developed, well-nourished, and in no distress. No distress.  HENT:  Head: Normocephalic.  Eyes: No scleral icterus.  Neck: Normal range of motion. Neck supple. No JVD present. No tracheal deviation present.  Cardiovascular: Normal rate, regular rhythm and normal heart sounds.  Exam reveals no gallop and no friction rub.   No murmur heard. Pulmonary/Chest: Effort normal and breath sounds normal. No respiratory distress. She has no wheezes. She has no rales. She exhibits  no tenderness.  Abdominal: Soft. Bowel sounds are normal. She exhibits no distension and no mass. There is no tenderness. There is no rebound and no guarding.  Musculoskeletal: Normal range of motion. She exhibits no edema.  Neurological: She is alert and oriented to person, place, and time.  Skin: Skin is warm. No rash noted. No erythema.  Psychiatric: Affect and judgment normal.   LABORATORY PANEL:   CBC  Recent Labs Lab 02/04/15 0458  WBC 10.1  HGB 11.3*  HCT 33.6*  PLT 7*   ------------------------------------------------------------------------------------------------------------------  Chemistries   Recent Labs Lab 01/31/15 1222  02/04/15 0458  NA 129*  < > 134*  K 3.8  < > 3.5  CL 97*  < > 107  CO2 19*  < > 23  GLUCOSE 145*  < > 127*  BUN 12  < > 9  CREATININE 1.13*  < > 0.77  CALCIUM 8.8*  < > 8.0*  MG  --   --  1.8  AST 23  --   --   ALT 15  --   --   ALKPHOS 72  --   --   BILITOT 1.2  --   --   < > = values in this interval not displayed.  RADIOLOGY:  No results found. ASSESSMENT AND PLAN:   This is a very pleasant 81 year old female with a history of essential hypertension and recent Escherichia coli bacteremia secondary to urinary tract infection who presents again with sepsis secondary to urinary tract infection and gram-negative rod bacteremia.  1.  Escherichia coli Sepsis: This is likely secondary to urinary tract infection.  Continue IV Rocephin.  Appreciate  infectious disease input  2.  New T12 compression fracture: Appreciate orthopedic input.  No intervention required.  3. Thrombocytopenia: Acute on chronic.  Appreciate oncology input, his platelet went from 12->6 and required 1 unit of platelet transfusion now it's up to 27, and in down to 7 this morning, about 1 more unit of platelets today.  Oncology.  Her smear is worrisome for possible myeloproliferative/myelodysplastic disorder such as CMML  4. Leukocytosis: CBC is trending down.  WBC is  10.1  5. Essential hypertension: Continue Norvasc  6.  Hypokalemia: Replete and recheck, check Magnesium  7.  Hydronephrosis and urinary retention: Appreciate urology input, Foley is draining well, recheck ultrasound in AM   Consult physical therapy  Management plans discussed with the patient and she  is in agreement.  CODE STATUS: DNR   TOTAL TIME TAKING CARE OF THIS PATIENT: 30 minutes.    POSSIBLE D/C 1-2 days, DEPENDING ON CLINICAL CONDITION.   [facility], Huston Stonehocker M.D on 02/04/2015 at 12:00 PM  Between 7am to 6pm - Pager - [phone] After 6pm go to [email or web address] - password [email or web address] [facility]  [facility]  Office  [phone]  CC: Primary care physician; Tommi Rumps, MD

## 2015-02-04 NOTE — Progress Notes (Signed)
Patient ID: [name], female   DOB: 11/02/1922, 81 y.o.   MRN: [id number]    Subjective: Mrs. [name] is doing well with foley catheter drainage but she continues to remain febrile with a Tmax of 101.8.  She voices no complaints.  She grew e. Coli which is sensitive to the rocephin she is being given.   ROS:  Review of Systems  Constitutional: Negative for chills.  Gastrointestinal: Negative for abdominal pain.  Genitourinary: Negative for hematuria.    Anti-infectives: Anti-infectives    Start     Dose/Rate Route Frequency Ordered Stop   02/02/15 1800  meropenem (MERREM) 1 g in sodium chloride 0.9 % 100 mL IVPB  Status:  Discontinued     1 g 200 mL/hr over 30 Minutes Intravenous Every 12 hours 02/02/15 1126 02/02/15 1335   02/02/15 1345  cefTRIAXone (ROCEPHIN) 1 g in dextrose 5 % 50 mL IVPB     1 g 100 mL/hr over 30 Minutes Intravenous Every 24 hours 02/02/15 1335     02/01/15 1800  cefTRIAXone (ROCEPHIN) 1 g in dextrose 5 % 50 mL IVPB  Status:  Discontinued     1 g 100 mL/hr over 30 Minutes Intravenous Every 24 hours 01/31/15 1358 01/31/15 1532   02/01/15 1700  meropenem (MERREM) 500 mg in sodium chloride 0.9 % 50 mL IVPB  Status:  Discontinued     500 mg 100 mL/hr over 30 Minutes Intravenous Every 12 hours 02/01/15 1621 02/02/15 1126   02/01/15 1000  cefTRIAXone (ROCEPHIN) 1 g in dextrose 5 % 50 mL IVPB  Status:  Discontinued     1 g 100 mL/hr over 30 Minutes Intravenous Every 24 hours 01/31/15 2214 02/01/15 1539   01/31/15 1800  cefTRIAXone (ROCEPHIN) 1 g in dextrose 5 % 50 mL IVPB  Status:  Discontinued     1 g 100 mL/hr over 30 Minutes Intravenous Every 24 hours 01/31/15 1353 01/31/15 1358   01/31/15 1236  dextrose 5 % with cefTRIAXone (ROCEPHIN) ADS Med  Status:  Discontinued    Comments:  Gwynn Burly: cabinet override      01/31/15 1236 01/31/15 1258   01/31/15 1230  cefTRIAXone (ROCEPHIN) 2 g in dextrose 5 % 50 mL IVPB     2 g 100 mL/hr over 30 Minutes Intravenous   Once 01/31/15 1220 01/31/15 1419      Current Facility-Administered Medications  Medication Dose Route Frequency Provider Last Rate Last Dose  . 0.9 %  sodium chloride infusion   Intravenous Once Lance Coon, MD      . acetaminophen (TYLENOL) tablet 650 mg  650 mg Oral Q6H PRN Lance Coon, MD   650 mg at 02/03/15 1451  . acetaminophen (TYLENOL) tablet 650 mg  650 mg Oral Once Lance Coon, MD      . amLODipine (NORVASC) tablet 5 mg  5 mg Oral Daily Vaughan Basta, MD   5 mg at 02/03/15 1114  . cefTRIAXone (ROCEPHIN) 1 g in dextrose 5 % 50 mL IVPB  1 g Intravenous Q24H Adrian Prows, MD   1 g at 02/03/15 1358  . diphenhydrAMINE (BENADRYL) capsule 25 mg  25 mg Oral Once Lance Coon, MD      . HYDROcodone-acetaminophen (NORCO/VICODIN) 5-325 MG per tablet 1 tablet  1 tablet Oral QID PRN Vaughan Basta, MD   0.5 tablet at 02/02/15 1016  . multivitamin-lutein (OCUVITE-LUTEIN) capsule 1 capsule  1 capsule Oral Daily Vaughan Basta, MD   1 capsule at 02/03/15 1114  .  senna-docusate (Senokot-S) tablet 2 tablet  2 tablet Oral BID Max Sane, MD   2 tablet at 02/03/15 2202     Objective: Vital signs in last 24 hours: Temp:  [99.6 F (37.6 C)-102.6 F (39.2 C)] 101.8 F (38.8 C) (09/02 1538) Pulse Rate:  [95-105] 105 (09/02 1538) Resp:  [16-20] 16 (09/02 1538) BP: (128-151)/(69-89) 128/89 mmHg (09/02 1538) SpO2:  [96 %] 96 % (09/02 1538)  Intake/Output from previous day: 09/02 0701 - 09/03 0700 In: 100 [P.O.:100] Out: 3450 [Urine:3450] Intake/Output this shift:     Physical Exam  Constitutional: She is well-developed, well-nourished, and in no distress.  Cardiovascular:  Sinus tachycardia  Pulmonary/Chest: Effort normal. No respiratory distress.  Abdominal: Soft. There is no tenderness.  Genitourinary:  Foley draining clear urine.     Lab Results:   Recent Labs  02/03/15 0535 02/04/15 0458  WBC 9.9 10.1  HGB 10.9* 11.3*  HCT 31.5* 33.6*  PLT 27*  7*   BMET  Recent Labs  02/03/15 0535 02/04/15 0458  NA 135 134*  K 3.1* 3.5  CL 107 107  CO2 23 23  GLUCOSE 111* 127*  BUN 10 9  CREATININE 0.79 0.77  CALCIUM 8.3* 8.0*   Results for orders placed or performed during the hospital encounter of 01/31/15  Blood Culture (routine x 2)     Status: None   Collection Time: 01/31/15 11:20 AM  Result Value Ref Range Status   Specimen Description BLOOD RIGHT HAND  Final   Special Requests   Final    BOTTLES DRAWN AEROBIC AND ANAEROBIC 3CC AEROBIC,3CC ANAEROBIC   Culture  Setup Time   Final    GRAM NEGATIVE RODS IN BOTH AEROBIC AND ANAEROBIC BOTTLES CRITICAL RESULT CALLED TO, READ BACK BY AND VERIFIED WITH: RENICIA GRAVES 02/01/2015 0120 [place] CONFIRMED BY AJO    Culture   Final    ESCHERICHIA COLI IN BOTH AEROBIC AND ANAEROBIC BOTTLES    Report Status 02/02/2015 FINAL  Final   Organism ID, Bacteria ESCHERICHIA COLI  Final      Susceptibility   Escherichia coli - MIC*    AMPICILLIN >=32 RESISTANT Resistant     CEFTAZIDIME <=1 SENSITIVE Sensitive     CEFAZOLIN <=4 SENSITIVE Sensitive     CEFTRIAXONE <=1 SENSITIVE Sensitive     CIPROFLOXACIN >=4 RESISTANT Resistant     GENTAMICIN >=16 RESISTANT Resistant     IMIPENEM <=0.25 SENSITIVE Sensitive     TRIMETH/SULFA <=20 SENSITIVE Sensitive     PIP/TAZO Value in next row Sensitive      SENSITIVE<=4    * ESCHERICHIA COLI  Blood Culture (routine x 2)     Status: None   Collection Time: 01/31/15 11:25 AM  Result Value Ref Range Status   Specimen Description BLOOD LEFT ASSIST CONTROL  Final   Special Requests   Final    BOTTLES DRAWN AEROBIC AND ANAEROBIC 2CC AEROBIC,2CC ANAEROBIC   Culture  Setup Time   Final    GRAM NEGATIVE RODS IN BOTH AEROBIC AND ANAEROBIC BOTTLES CRITICAL RESULT CALLED TO, READ BACK BY AND VERIFIED WITH: RENICIA GRAVES 02/01/2015 0120 [place] CONFIRMED BY AJO    Culture   Final    ESCHERICHIA COLI IN BOTH AEROBIC AND ANAEROBIC BOTTLES    Report Status  02/02/2015 FINAL  Final   Organism ID, Bacteria ESCHERICHIA COLI  Final      Susceptibility   Escherichia coli - MIC*    AMPICILLIN >=32 RESISTANT Resistant     CEFTAZIDIME <=  1 SENSITIVE Sensitive     CEFAZOLIN <=4 SENSITIVE Sensitive     CEFTRIAXONE <=1 SENSITIVE Sensitive     CIPROFLOXACIN >=4 RESISTANT Resistant     GENTAMICIN >=16 RESISTANT Resistant     IMIPENEM <=0.25 SENSITIVE Sensitive     TRIMETH/SULFA <=20 SENSITIVE Sensitive     PIP/TAZO Value in next row Sensitive      SENSITIVE<=4    * ESCHERICHIA COLI  Urine culture     Status: None   Collection Time: 01/31/15 12:22 PM  Result Value Ref Range Status   Specimen Description URINE, CLEAN CATCH  Final   Special Requests NONE  Final   Culture >=100,000 COLONIES/mL ESCHERICHIA COLI  Final   Report Status 02/02/2015 FINAL  Final   Organism ID, Bacteria ESCHERICHIA COLI  Final      Susceptibility   Escherichia coli - MIC*    AMPICILLIN >=32 RESISTANT Resistant     CEFAZOLIN <=4 SENSITIVE Sensitive     CEFTRIAXONE <=1 SENSITIVE Sensitive     CIPROFLOXACIN >=4 RESISTANT Resistant     GENTAMICIN >=16 RESISTANT Resistant     IMIPENEM <=0.25 SENSITIVE Sensitive     NITROFURANTOIN <=16 SENSITIVE Sensitive     TRIMETH/SULFA <=20 SENSITIVE Sensitive     PIP/TAZO Value in next row Sensitive      SENSITIVE<=4    * >=100,000 COLONIES/mL ESCHERICHIA COLI  Urine culture     Status: None (Preliminary result)   Collection Time: 02/02/15  2:11 PM  Result Value Ref Range Status   Specimen Description URINE, RANDOM  Final   Special Requests NONE  Final   Culture NO GROWTH < 24 HOURS  Final   Report Status PENDING  Incomplete   PT/INR No results for input(s): LABPROT, INR in the last 72 hours. ABG No results for input(s): PHART, HCO3 in the last 72 hours.  Invalid input(s): PCO2, PO2  Studies/Results: No results found.  Lab results reviewed including blood work and urine culture.  Assessment and Plan: Urosepsis from  urinary retention with bilateral hydro improving with foley catheter drainage but still febrile on culture specific antibiotics.  Leave foley to drainage.  She will need urology f/u with the foley in about 2 weeks for consideration of a voiding trial or urodynamics.       LOS: 4 days    Malka So 02/04/2015 [phone]

## 2015-02-04 NOTE — Progress Notes (Signed)
[facility]  Date of admission:  01/31/2015  Inpatient day:  02/03/2015  Chief Complaint: [name] is a 81 y.o. female with history of recurrent E coli UTI and thrombocytopenia who was admitted with sepsis secondary to a recurrent UTI.  Subjective: Patient denies any complaints except for fatigue.  She has a Foley catheter in place.  Past Medical History  Diagnosis Date  . HTN (hypertension)   . Syncope   . Hyperlipidemia   . Ischemic colitis   . Thrombocytopenia   . Mobitz type 2 second degree heart block   . Anemia   . Pacemaker     Past Surgical History  Procedure Laterality Date  . Left hip replacement     . Cholecystectomy      Family History  Problem Relation Age of Onset  . Stroke Mother     Social History:  reports that she has quit smoking. She has never used smokeless tobacco. She reports that she does not drink alcohol or use illicit drugs.  She lives with her daughter in [place], [place].  She is alone today.  Allergies:  Allergies  Allergen Reactions  . Atenolol Other (See Comments)    Reaction:  Complete Heart Block  . Penicillins Other (See Comments)    Reaction:  Unknown   . Dilaudid [Hydromorphone Hcl] Rash    Medications Prior to Admission  Medication Sig Dispense Refill  . amLODipine (NORVASC) 5 MG tablet Take 5 mg by mouth daily.     Marland Kitchen aspirin EC 81 MG tablet Take 81 mg by mouth daily.    . carvedilol (COREG) 12.5 MG tablet Take 12.5 mg by mouth 2 (two) times daily.    . furosemide (LASIX) 20 MG tablet Take 10 mg by mouth daily as needed for edema.     . hydrALAZINE (APRESOLINE) 10 MG tablet Take 10 mg by mouth 2 (two) times daily.     Marland Kitchen HYDROcodone-acetaminophen (NORCO/VICODIN) 5-325 MG per tablet Take 1 tablet by mouth 4 (four) times daily as needed for moderate pain.    Marland Kitchen latanoprost (XALATAN) 0.005 % ophthalmic solution Place 1 drop into both eyes at bedtime.    Marland Kitchen losartan (COZAAR) 100 MG tablet Take 50 mg by mouth 2 (two)  times daily.    . Multiple Vitamin (MULTIVITAMIN WITH MINERALS) TABS tablet Take 1 tablet by mouth daily.    . Multiple Vitamins-Minerals (PRESERVISION AREDS 2) CAPS Take 2 capsules by mouth daily.    . Omega-3 Fatty Acids (FISH OIL) 1000 MG CAPS Take 2,000 mg by mouth daily.     Marland Kitchen acyclovir ointment (ZOVIRAX) 5 % Apply topically 2 (two) times daily after a meal. Put on lip lesions twice a day for one week. (Patient not taking: Reported on 01/31/2015) 5 g 0  . sulfamethoxazole-trimethoprim (BACTRIM) 400-80 MG per tablet Take 1 tablet by mouth 2 (two) times daily. (Patient not taking: Reported on 01/31/2015) 20 tablet 0    Review of Systems: GENERAL:  General fatigue.  Ongoing fevers.  No sweats.  Weight stable. PERFORMANCE STATUS (ECOG):  2 HEENT:  No visual changes, runny nose, sore throat, mouth sores or tenderness. Lungs: No shortness of breath or cough.  No hemoptysis. Cardiac:  No chest pain, palpitations, orthopnea, or PND. GI:  Appetite fair.  No nausea, vomiting, diarrhea, constipation, melena or hematochezia. GU:  Foley catheter.  Dysuria.  No hematuria. Musculoskeletal:  Back pain.  Hip pain.  No muscle tenderness. Extremities:  No pain or swelling.  Skin:  No rashes or skin changes. Neuro:  No headache, numbness or weakness, balance or coordination issues. Endocrine:  No diabetes, thyroid issues, hot flashes or night sweats. Psych:  No mood changes, depression or anxiety. Pain:  No focal pain. Review of systems:  All other systems reviewed and found to be negative.  Physical Exam:  Blood pressure 128/89, pulse 105, temperature 101.8 F (38.8 C), temperature source Oral, resp. rate 16, height $RemoveBe'5\' 1"'uxEuSHRou$  (1.549 m), weight 119 lb (53.978 kg), SpO2 96 %.  GENERAL:  Elderly woman lying comfortably on the medical unit in no acute distress. MENTAL STATUS:  Alert and oriented to person, place and time. HEAD:  Blonde hair.  Normocephalic, atraumatic, face symmetric, no Cushingoid  features. EYES:  Glasses.  Brown eyes.  Pupils equal round and reactive to light and accomodation.  No conjunctivitis or scleral icterus. ENT:  Dentures.  Oropharynx clear without lesion.  No palatal petechiae.  No nasal mucosa bleeding. ABDOMEN:  Soft, non-tender, with active bowel sounds, and no hepatosplenomegaly.  No masses. GU:  Foley catheter in place.   SKIN:  No petechiae.  No rashes, ulcers or lesions. EXTREMITIES: ICDs in place.  No edema, no skin discoloration or tenderness.  No palpable cords. LYMPH NODES: No palpable cervical, supraclavicular, axillary or inguinal adenopathy  NEUROLOGICAL: Unremarkable. PSYCH:  Appropriate.  Results for orders placed or performed during the hospital encounter of 01/31/15 (from the past 48 hour(s))  Urine culture     Status: None (Preliminary result)   Collection Time: 02/02/15  2:11 PM  Result Value Ref Range   Specimen Description URINE, RANDOM    Special Requests NONE    Culture NO GROWTH < 24 HOURS    Report Status PENDING   Type and screen     Status: None   Collection Time: 02/02/15  8:44 PM  Result Value Ref Range   ABO/RH(D) AB POS    Antibody Screen NEG    Sample Expiration 02/05/2015   Platelet count     Status: Abnormal   Collection Time: 02/02/15  8:44 PM  Result Value Ref Range   Platelets 6 (LL) 150 - 440 K/uL    Comment: RESULT REPEATED AND VERIFIED PLATELET COUNT CONFIRMED BY SMEAR CRITICAL RESULT CALLED TO, READ BACK BY AND VERIFIED WITH: WITH STACEY CLAY AT 2140 02/02/15 BY [facility]   Prepare Pheresed Platelets     Status: None (Preliminary result)   Collection Time: 02/02/15  8:44 PM  Result Value Ref Range   Unit Number [id number]    Blood Component Type PLTP LR1 PAS    Unit division 00    Status of Unit ISSUED    Transfusion Status OK TO TRANSFUSE   ABO/Rh     Status: None   Collection Time: 02/02/15  8:45 PM  Result Value Ref Range   ABO/RH(D) AB POS   CBC     Status: Abnormal   Collection Time: 02/03/15   5:35 AM  Result Value Ref Range   WBC 9.9 3.6 - 11.0 K/uL   RBC 3.52 (L) 3.80 - 5.20 MIL/uL   Hemoglobin 10.9 (L) 12.0 - 16.0 g/dL   HCT 31.5 (L) 35.0 - 47.0 %   MCV 89.6 80.0 - 100.0 fL   MCH 30.9 26.0 - 34.0 pg   MCHC 34.5 32.0 - 36.0 g/dL   RDW 14.9 (H) 11.5 - 14.5 %   Platelets 27 (LL) 150 - 440 K/uL    Comment: RESULT REPEATED AND VERIFIED CRITICAL  VALUE NOTED.  VALUE IS CONSISTENT WITH PREVIOUSLY REPORTED AND CALLED VALUE.   Basic metabolic panel     Status: Abnormal   Collection Time: 02/03/15  5:35 AM  Result Value Ref Range   Sodium 135 135 - 145 mmol/L   Potassium 3.1 (L) 3.5 - 5.1 mmol/L   Chloride 107 101 - 111 mmol/L   CO2 23 22 - 32 mmol/L   Glucose, Bld 111 (H) 65 - 99 mg/dL   BUN 10 6 - 20 mg/dL   Creatinine, Ser 0.79 0.44 - 1.00 mg/dL   Calcium 8.3 (L) 8.9 - 10.3 mg/dL   GFR calc non Af Amer >60 >60 mL/min   GFR calc Af Amer >60 >60 mL/min    Comment: (NOTE) The eGFR has been calculated using the CKD EPI equation. This calculation has not been validated in all clinical situations. eGFR's persistently <60 mL/min signify possible Chronic Kidney Disease.    Anion gap 5 5 - 15  Vitamin B12     Status: None   Collection Time: 02/03/15  5:35 AM  Result Value Ref Range   Vitamin B-12 284 180 - 914 pg/mL    Comment: (NOTE) This assay is not validated for testing neonatal or myeloproliferative syndrome specimens for Vitamin B12 levels. Performed at [facility]   Folate     Status: None   Collection Time: 02/03/15  5:35 AM  Result Value Ref Range   Folate 20.2 >5.9 ng/mL  Fibrinogen     Status: Abnormal   Collection Time: 02/03/15  5:35 AM  Result Value Ref Range   Fibrinogen 643 (H) 210 - 470 mg/dL  CBC with Differential     Status: Abnormal   Collection Time: 02/04/15  4:58 AM  Result Value Ref Range   WBC 10.1 3.6 - 11.0 K/uL   RBC 3.75 (L) 3.80 - 5.20 MIL/uL   Hemoglobin 11.3 (L) 12.0 - 16.0 g/dL   HCT 33.6 (L) 35.0 - 47.0 %   MCV 89.4 80.0  - 100.0 fL   MCH 30.2 26.0 - 34.0 pg   MCHC 33.8 32.0 - 36.0 g/dL   RDW 15.1 (H) 11.5 - 14.5 %   Platelets 7 (LL) 150 - 440 K/uL    Comment: PLATELET COUNT CONFIRMED BY SMEAR CRITICAL VALUE NOTED.  VALUE IS CONSISTENT WITH PREVIOUSLY REPORTED AND CALLED VALUE.    Neutrophils Relative % 67% %   Neutro Abs 6.8 (H) 1.4 - 6.5 K/uL   Lymphocytes Relative 10% %   Lymphs Abs 1.0 1.0 - 3.6 K/uL   Monocytes Relative 22% %   Monocytes Absolute 2.2 (H) 0.2 - 0.9 K/uL   Eosinophils Relative 1% %   Eosinophils Absolute 0.1 0 - 0.7 K/uL   Basophils Relative 0% %   Basophils Absolute 0.0 0 - 0.1 K/uL   No results found.  Assessment:  The patient is a 81 y.o. woman with recurrent E coli UTI and associated bacteremia.  She has a history of thrombocytopenia secondary to consumption associated with infection.  She likely has poor marrow reserve secondary to her age.  In addition, she was recently on a course of Septra, known myelosuppressant agent, prior to admission.  Peripheral smear reveals marked thrombocytopenia, scattered teardrop cells, and rare schistocytes.  Creatinine is at baseline.  No evidence of TTP.  MAR with notation of discontinuation of heparin, but unclear if patient received (doubt HIT).  Coagulation studies r/o DIC.  Diet is modest.  She has been on no  herbal products.  She denies hepatitis or prior transfusion.  Plan:   1.  Hematology/Oncology:  No bleeding.  Good response to platelet transfusion from 6,000 to 27,000.  Ongoing fever and consumption of platelets.  Peripheral blood monocytosis evident on review of available CBCs in conjunction with age and chronic thrombocytopenia (albeit at times of infection while hospitalized) gives consideration for a myeloproliferative/myelodysplastic disorder such as CMML.  Will initiate work-up.  Fibrinogen normal.  B12 low normal, thus sent MMA to r/o B12 deficiency.  Additional work-up pending (SPEP, free light chains, ANA).  HIT assay ordered on  admission.  Transfuse platelets if <10,000 or bleeding. 2.  Infectious disease:  Pyelonephritis and bacteremia.  Unclear why fevers persist.  CT scan without evidence of abscess.  Would obtain blood cultures daily with temperature spikes to ensure clearing infection.  Consider echo.  Thank you for allowing me to participate in Makylee Sanborn 's care.  Dr. Grayland Ormond will be covering over the Labor Day holiday weekend.   Lequita Asal, MD

## 2015-02-04 NOTE — Progress Notes (Signed)
[facility]  Date of admission:  01/31/2015  Inpatient day:  02/03/2015  Chief Complaint: [name] is a 81 y.o. female with history of recurrent E coli UTI and thrombocytopenia who was admitted with sepsis secondary to a recurrent UTI.  Subjective: Patient continues to feel weak and fatigued, but otherwise feels well. She denies any bleeding.    Past Medical History  Diagnosis Date  . HTN (hypertension)   . Syncope   . Hyperlipidemia   . Ischemic colitis   . Thrombocytopenia   . Mobitz type 2 second degree heart block   . Anemia   . Pacemaker     Past Surgical History  Procedure Laterality Date  . Left hip replacement     . Cholecystectomy      Family History  Problem Relation Age of Onset  . Stroke Mother     Social History:  reports that she has quit smoking. She has never used smokeless tobacco. She reports that she does not drink alcohol or use illicit drugs.  She lives with her daughter in [place], [place].  She is alone today.  Allergies:  Allergies  Allergen Reactions  . Atenolol Other (See Comments)    Reaction:  Complete Heart Block  . Penicillins Other (See Comments)    Reaction:  Unknown   . Dilaudid [Hydromorphone Hcl] Rash    Medications Prior to Admission  Medication Sig Dispense Refill  . amLODipine (NORVASC) 5 MG tablet Take 5 mg by mouth daily.     . aspirin EC 81 MG tablet Take 81 mg by mouth daily.    . carvedilol (COREG) 12.5 MG tablet Take 12.5 mg by mouth 2 (two) times daily.    . furosemide (LASIX) 20 MG tablet Take 10 mg by mouth daily as needed for edema.     . hydrALAZINE (APRESOLINE) 10 MG tablet Take 10 mg by mouth 2 (two) times daily.     . HYDROcodone-acetaminophen (NORCO/VICODIN) 5-325 MG per tablet Take 1 tablet by mouth 4 (four) times daily as needed for moderate pain.    . latanoprost (XALATAN) 0.005 % ophthalmic solution Place 1 drop into both eyes at bedtime.    . losartan (COZAAR) 100 MG tablet Take 50 mg by  mouth 2 (two) times daily.    . Multiple Vitamin (MULTIVITAMIN WITH MINERALS) TABS tablet Take 1 tablet by mouth daily.    . Multiple Vitamins-Minerals (PRESERVISION AREDS 2) CAPS Take 2 capsules by mouth daily.    . Omega-3 Fatty Acids (FISH OIL) 1000 MG CAPS Take 2,000 mg by mouth daily.     . acyclovir ointment (ZOVIRAX) 5 % Apply topically 2 (two) times daily after a meal. Put on lip lesions twice a day for one week. (Patient not taking: Reported on 01/31/2015) 5 g 0  . sulfamethoxazole-trimethoprim (BACTRIM) 400-80 MG per tablet Take 1 tablet by mouth 2 (two) times daily. (Patient not taking: Reported on 01/31/2015) 20 tablet 0    Review of Systems: GENERAL:  General fatigue.  Ongoing fevers.  No sweats.  Weight stable. PERFORMANCE STATUS (ECOG):  2 HEENT:  No visual changes, runny nose, sore throat, mouth sores or tenderness. Lungs: No shortness of breath or cough.  No hemoptysis. Cardiac:  No chest pain, palpitations, orthopnea, or PND. GI:  Appetite fair.  No nausea, vomiting, diarrhea, constipation, melena or hematochezia. GU:  Foley catheter.  Dysuria.  No hematuria. Musculoskeletal:  Back pain.  Hip pain.  No muscle tenderness. Extremities:  No pain   or swelling. Skin:  No rashes or skin changes. Neuro:  No headache, numbness or weakness, balance or coordination issues. Endocrine:  No diabetes, thyroid issues, hot flashes or night sweats. Psych:  No mood changes, depression or anxiety. Pain:  No focal pain. Review of systems:  All other systems reviewed and found to be negative.  Physical Exam:  Blood pressure 122/66, pulse 98, temperature 99.5 F (37.5 C), temperature source Oral, resp. rate 18, height 5' 1" (1.549 m), weight 119 lb (53.978 kg), SpO2 96 %.  GENERAL:  Elderly woman lying comfortably on the medical unit in no acute distress. MENTAL STATUS:  Alert and oriented to person, place and time. HEAD:  Blonde hair.  Normocephalic, atraumatic, face symmetric, no Cushingoid  features. EYES:  Glasses.  Brown eyes.  Pupils equal round and reactive to light and accomodation.  No conjunctivitis or scleral icterus. ENT:  Dentures.  Oropharynx clear without lesion.  No palatal petechiae.  No nasal mucosa bleeding. ABDOMEN:  Soft, non-tender, with active bowel sounds, and no hepatosplenomegaly.  No masses. GU:  Foley catheter in place.   SKIN:  No petechiae.  No rashes, ulcers or lesions. EXTREMITIES: ICDs in place.  No edema, no skin discoloration or tenderness.  No palpable cords. LYMPH NODES: No palpable cervical, supraclavicular, axillary or inguinal adenopathy  NEUROLOGICAL: Unremarkable. PSYCH:  Appropriate.  Results for orders placed or performed during the hospital encounter of 01/31/15 (from the past 48 hour(s))  Type and screen     Status: None   Collection Time: 02/02/15  8:44 PM  Result Value Ref Range   ABO/RH(D) AB POS    Antibody Screen NEG    Sample Expiration 02/05/2015   Platelet count     Status: Abnormal   Collection Time: 02/02/15  8:44 PM  Result Value Ref Range   Platelets 6 (LL) 150 - 440 K/uL    Comment: RESULT REPEATED AND VERIFIED PLATELET COUNT CONFIRMED BY SMEAR CRITICAL RESULT CALLED TO, READ BACK BY AND VERIFIED WITH: WITH STACEY CLAY AT 2140 02/02/15 BY [facility]   Prepare Pheresed Platelets     Status: None   Collection Time: 02/02/15  8:44 PM  Result Value Ref Range   Unit Number [id number]    Blood Component Type PLTP LR1 PAS    Unit division 00    Status of Unit ISSUED,FINAL    Transfusion Status OK TO TRANSFUSE   ABO/Rh     Status: None   Collection Time: 02/02/15  8:45 PM  Result Value Ref Range   ABO/RH(D) AB POS   CBC     Status: Abnormal   Collection Time: 02/03/15  5:35 AM  Result Value Ref Range   WBC 9.9 3.6 - 11.0 K/uL   RBC 3.52 (L) 3.80 - 5.20 MIL/uL   Hemoglobin 10.9 (L) 12.0 - 16.0 g/dL   HCT 31.5 (L) 35.0 - 47.0 %   MCV 89.6 80.0 - 100.0 fL   MCH 30.9 26.0 - 34.0 pg   MCHC 34.5 32.0 - 36.0 g/dL   RDW  14.9 (H) 11.5 - 14.5 %   Platelets 27 (LL) 150 - 440 K/uL    Comment: RESULT REPEATED AND VERIFIED CRITICAL VALUE NOTED.  VALUE IS CONSISTENT WITH PREVIOUSLY REPORTED AND CALLED VALUE.   Basic metabolic panel     Status: Abnormal   Collection Time: 02/03/15  5:35 AM  Result Value Ref Range   Sodium 135 135 - 145 mmol/L   Potassium 3.1 (L) 3.5 - 5.1   mmol/L   Chloride 107 101 - 111 mmol/L   CO2 23 22 - 32 mmol/L   Glucose, Bld 111 (H) 65 - 99 mg/dL   BUN 10 6 - 20 mg/dL   Creatinine, Ser 0.79 0.44 - 1.00 mg/dL   Calcium 8.3 (L) 8.9 - 10.3 mg/dL   GFR calc non Af Amer >60 >60 mL/min   GFR calc Af Amer >60 >60 mL/min    Comment: (NOTE) The eGFR has been calculated using the CKD EPI equation. This calculation has not been validated in all clinical situations. eGFR's persistently <60 mL/min signify possible Chronic Kidney Disease.    Anion gap 5 5 - 15  Vitamin B12     Status: None   Collection Time: 02/03/15  5:35 AM  Result Value Ref Range   Vitamin B-12 284 180 - 914 pg/mL    Comment: (NOTE) This assay is not validated for testing neonatal or myeloproliferative syndrome specimens for Vitamin B12 levels. Performed at [facility]   Folate     Status: None   Collection Time: 02/03/15  5:35 AM  Result Value Ref Range   Folate 20.2 >5.9 ng/mL  Fibrinogen     Status: Abnormal   Collection Time: 02/03/15  5:35 AM  Result Value Ref Range   Fibrinogen 643 (H) 210 - 470 mg/dL  CBC with Differential     Status: Abnormal   Collection Time: 02/04/15  4:58 AM  Result Value Ref Range   WBC 10.1 3.6 - 11.0 K/uL   RBC 3.75 (L) 3.80 - 5.20 MIL/uL   Hemoglobin 11.3 (L) 12.0 - 16.0 g/dL   HCT 33.6 (L) 35.0 - 47.0 %   MCV 89.4 80.0 - 100.0 fL   MCH 30.2 26.0 - 34.0 pg   MCHC 33.8 32.0 - 36.0 g/dL   RDW 15.1 (H) 11.5 - 14.5 %   Platelets 7 (LL) 150 - 440 K/uL    Comment: PLATELET COUNT CONFIRMED BY SMEAR CRITICAL VALUE NOTED.  VALUE IS CONSISTENT WITH PREVIOUSLY REPORTED AND  CALLED VALUE.    Neutrophils Relative % 67% %   Neutro Abs 6.8 (H) 1.4 - 6.5 K/uL   Lymphocytes Relative 10% %   Lymphs Abs 1.0 1.0 - 3.6 K/uL   Monocytes Relative 22% %   Monocytes Absolute 2.2 (H) 0.2 - 0.9 K/uL   Eosinophils Relative 1% %   Eosinophils Absolute 0.1 0 - 0.7 K/uL   Basophils Relative 0% %   Basophils Absolute 0.0 0 - 0.1 K/uL  Basic metabolic panel     Status: Abnormal   Collection Time: 02/04/15  4:58 AM  Result Value Ref Range   Sodium 134 (L) 135 - 145 mmol/L   Potassium 3.5 3.5 - 5.1 mmol/L   Chloride 107 101 - 111 mmol/L   CO2 23 22 - 32 mmol/L   Glucose, Bld 127 (H) 65 - 99 mg/dL   BUN 9 6 - 20 mg/dL   Creatinine, Ser 0.77 0.44 - 1.00 mg/dL   Calcium 8.0 (L) 8.9 - 10.3 mg/dL   GFR calc non Af Amer >60 >60 mL/min   GFR calc Af Amer >60 >60 mL/min    Comment: (NOTE) The eGFR has been calculated using the CKD EPI equation. This calculation has not been validated in all clinical situations. eGFR's persistently <60 mL/min signify possible Chronic Kidney Disease.    Anion gap 4 (L) 5 - 15  Magnesium     Status: None   Collection Time: 02/04/15  4:58 AM  Result Value Ref Range   Magnesium 1.8 1.7 - 2.4 mg/dL  Prepare Pheresed Platelets     Status: None (Preliminary result)   Collection Time: 02/04/15 10:10 AM  Result Value Ref Range   Unit Number [id number]    Blood Component Type PLTPHER LRI1    Unit division 00    Status of Unit ISSUED    Transfusion Status OK TO TRANSFUSE    No results found.  Assessment:  The patient is a 81 y.o. woman with recurrent E coli UTI and associated bacteremia.  She has a history of thrombocytopenia secondary to consumption associated with infection.  She likely has poor marrow reserve secondary to her age.  In addition, she was recently on a course of Septra, known myelosuppressant agent, prior to admission.  Peripheral smear reveals marked thrombocytopenia, scattered teardrop cells, and rare schistocytes.   Creatinine is at baseline.  No evidence of TTP.  MAR with notation of discontinuation of heparin, but unclear if patient received (doubt HIT).  Coagulation studies r/o DIC.  Diet is modest.  She has been on no herbal products.  She denies hepatitis or prior transfusion.  Plan:   1.  Hematology/Oncology:  No bleeding.  Good response to platelet transfusion from 6,000 to 27,000.  Patient's platelet count has now dropped back down to 7000 and if she is currently receiving 1 unit of platelets. Likely secondary to ongoing fever and consumption of platelets.  Peripheral blood monocytosis evident on review of available CBCs in conjunction with age and chronic thrombocytopenia (albeit at times of infection while hospitalized) gives consideration for a myeloproliferative/myelodysplastic disorder such as CMML.  Flow cytometry as well as BCR-ABL have been ordered. Can consider bone marrow biopsy in the future, but this is unlikely necessary given patient's advanced age.  Fibrinogen normal.  B12 low normal, thus sent MMA to r/o B12 deficiency.  Additional work-up pending (SPEP, free light chains, ANA).  HIT assay ordered on admission.  Transfuse platelets if <10,000 or bleeding. 2.  Infectious disease:  Pyelonephritis and bacteremia.  Appreciate infectious disease input. CT scan without evidence of abscess.   Thank you for allowing me to participate in [name] 's care.    Will follow.    Lloyd Huger, MD

## 2015-02-04 NOTE — Progress Notes (Signed)
Pt. Platelets at 7 this AM. MD notified and order placed to infuse platelets.

## 2015-02-05 ENCOUNTER — Inpatient Hospital Stay: Payer: Medicare Other

## 2015-02-05 LAB — PREPARE PLATELET PHERESIS: UNIT DIVISION: 0

## 2015-02-05 LAB — CBC WITH DIFFERENTIAL/PLATELET
Basophils Absolute: 0 10*3/uL (ref 0–0.1)
Basophils Relative: 0 %
Eosinophils Absolute: 0.1 10*3/uL (ref 0–0.7)
Eosinophils Relative: 1 %
HCT: 30.5 % — ABNORMAL LOW (ref 35.0–47.0)
Hemoglobin: 10.4 g/dL — ABNORMAL LOW (ref 12.0–16.0)
Lymphocytes Relative: 15 %
Lymphs Abs: 1.4 10*3/uL (ref 1.0–3.6)
MCH: 30.2 pg (ref 26.0–34.0)
MCHC: 34 g/dL (ref 32.0–36.0)
MCV: 89 fL (ref 80.0–100.0)
Monocytes Absolute: 2.2 10*3/uL — ABNORMAL HIGH (ref 0.2–0.9)
Monocytes Relative: 24 %
Neutro Abs: 5.3 10*3/uL (ref 1.4–6.5)
Neutrophils Relative %: 60 %
Platelets: 20 10*3/uL — CL (ref 150–440)
RBC: 3.43 MIL/uL — ABNORMAL LOW (ref 3.80–5.20)
RDW: 14.8 % — ABNORMAL HIGH (ref 11.5–14.5)
WBC: 9 10*3/uL (ref 3.6–11.0)

## 2015-02-05 LAB — BASIC METABOLIC PANEL
ANION GAP: 7 (ref 5–15)
BUN: 8 mg/dL (ref 6–20)
CALCIUM: 8.4 mg/dL — AB (ref 8.9–10.3)
CO2: 24 mmol/L (ref 22–32)
Chloride: 107 mmol/L (ref 101–111)
Creatinine, Ser: 0.75 mg/dL (ref 0.44–1.00)
GLUCOSE: 109 mg/dL — AB (ref 65–99)
POTASSIUM: 3.4 mmol/L — AB (ref 3.5–5.1)
Sodium: 138 mmol/L (ref 135–145)

## 2015-02-05 MED ORDER — POTASSIUM CHLORIDE CRYS ER 20 MEQ PO TBCR
40.0000 meq | EXTENDED_RELEASE_TABLET | Freq: Once | ORAL | Status: AC
  Administered 2015-02-05: 40 meq via ORAL
  Filled 2015-02-05: qty 2

## 2015-02-05 NOTE — Care Management Note (Signed)
Case Management Note  Patient Details  Name: [name] MRN: [id number] Date of Birth: Nov 10, 1922  Subjective/Objective:     On 02/04/15 temp was spiking to 101.8. Today's maximum temp is 99.2. Renal ultrasound today. PT consult. Has already been seen by Oncology and Urology.                Action/Plan:   Expected Discharge Date:                  Expected Discharge Plan:     In-House Referral:     Discharge planning Services     Post Acute Care Choice:    Choice offered to:     DME Arranged:    DME Agency:     HH Arranged:    [place] Agency:     Status of Service:     Medicare Important Message Given:  Yes-third notification given Date Medicare IM Given:    Medicare IM give by:    Date Additional Medicare IM Given:    Additional Medicare Important Message give by:     If discussed at [place] of Stay Meetings, dates discussed:    Additional Comments:  Keerthi Hazell A, RN 02/05/2015, 6:33 PM

## 2015-02-05 NOTE — Progress Notes (Addendum)
[facility] [place] NAME: [name]    MR#:  [id number]  DATE OF BIRTH:  Sep 28, 1922  SUBJECTIVE:  sleepy today.  She could not sleep well last night, has been afebrile, platelet count 20 this am after 1 unit of transfusion y'day for platelets of 7  REVIEW OF SYSTEMS:    Review of Systems  Constitutional: Negative for fever, chills and malaise/fatigue.  HENT: Negative for sore throat.   Eyes: Negative for blurred vision.  Respiratory: Negative for cough, hemoptysis, shortness of breath and wheezing.   Cardiovascular: Negative for chest pain, palpitations and leg swelling.  Gastrointestinal: Negative for nausea, vomiting, abdominal pain, diarrhea and blood in stool.  Genitourinary: Positive for dysuria. Negative for urgency, frequency, hematuria and flank pain.  Musculoskeletal: Negative for back pain.  Neurological: Negative for dizziness, tremors and headaches.  Endo/Heme/Allergies: Does not bruise/bleed easily.    Tolerating Diet:Yes DRUG ALLERGIES:   Allergies  Allergen Reactions  . Atenolol Other (See Comments)    Reaction:  Complete Heart Block  . Penicillins Other (See Comments)    Reaction:  Unknown   . Dilaudid [Hydromorphone Hcl] Rash   VITALS:  Blood pressure 159/73, pulse 97, temperature 98.7 F (37.1 C), temperature source Oral, resp. rate 16, height 5\' 1"  (1.549 m), weight 53.978 kg (119 lb), SpO2 97 %. PHYSICAL EXAMINATION:  Physical Exam  Constitutional: She is oriented to person, place, and time and well-developed, well-nourished, and in no distress. No distress.  HENT:  Head: Normocephalic.  Eyes: No scleral icterus.  Neck: Normal range of motion. Neck supple. No JVD present. No tracheal deviation present.  Cardiovascular: Normal rate, regular rhythm and normal heart sounds.  Exam reveals no gallop and no friction rub.   No murmur heard. Pulmonary/Chest: Effort normal and breath sounds normal. No  respiratory distress. She has no wheezes. She has no rales. She exhibits no tenderness.  Abdominal: Soft. Bowel sounds are normal. She exhibits no distension and no mass. There is no tenderness. There is no rebound and no guarding.  Musculoskeletal: Normal range of motion. She exhibits no edema.  Neurological: She is alert and oriented to person, place, and time.  Skin: Skin is warm. No rash noted. No erythema.  Psychiatric: Affect and judgment normal.   LABORATORY PANEL:   CBC  Recent Labs Lab 02/05/15 0558  WBC 9.0  HGB 10.4*  HCT 30.5*  PLT 20*   ------------------------------------------------------------------------------------------------------------------  Chemistries   Recent Labs Lab 01/31/15 1222  02/04/15 0458 02/05/15 0558  NA 129*  < > 134* 138  K 3.8  < > 3.5 3.4*  CL 97*  < > 107 107  CO2 19*  < > 23 24  GLUCOSE 145*  < > 127* 109*  BUN 12  < > 9 8  CREATININE 1.13*  < > 0.77 0.75  CALCIUM 8.8*  < > 8.0* 8.4*  MG  --   --  1.8  --   AST 23  --   --   --   ALT 15  --   --   --   ALKPHOS 72  --   --   --   BILITOT 1.2  --   --   --   < > = values in this interval not displayed.  RADIOLOGY:  No results found. ASSESSMENT AND PLAN:   This is a very pleasant 81 year old female with a history of essential hypertension and recent Escherichia coli bacteremia  secondary to urinary tract infection who presents again with sepsis secondary to urinary tract infection and gram-negative rod bacteremia.  1.  Escherichia coli Sepsis: This is likely secondary to urinary tract infection.  Continue IV Rocephin.  Appreciate infectious disease input  2.  T12 compression fracture: Appreciate orthopedic input.  No intervention required. Likely sub-acute/chronic. Not symptomatic.  3. Thrombocytopenia: Acute on chronic.  Appreciate oncology input, his platelet went from 12->6 and required 1 unit of platelet transfusion now it's up to 27, and in down to 7 this morning, about 1  more unit of platelets today.  Oncology.  Her smear is worrisome for possible myeloproliferative/myelodysplastic disorder such as CMML  4. Leukocytosis: CBC is trending down.  WBC is 10.1  5. Essential hypertension: Continue Norvasc  6.  Hypokalemia: Replete and recheck, check Magnesium  7.  Hydronephrosis and urinary retention: Appreciate urology input, Foley is draining well, recheck ultrasound today  Consult physical therapy  Management plans discussed with the patient and she  is in agreement.  CODE STATUS: DNR   TOTAL TIME TAKING CARE OF THIS PATIENT: 30 minutes.    POSSIBLE D/C 1-2 days, DEPENDING ON CLINICAL CONDITION.   [facility], Jamiah Homeyer M.D on 02/05/2015 at 11:53 AM  Between 7am to 6pm - Pager - [phone] After 6pm go to [email or web address] - password [email or web address] [facility]  [place] [facility]  Office  [phone]  CC: Primary care physician; Tommi Rumps, MD

## 2015-02-06 LAB — CBC WITH DIFFERENTIAL/PLATELET
Basophils Absolute: 0 10*3/uL (ref 0–0.1)
Basophils Relative: 0 %
Eosinophils Absolute: 0.1 10*3/uL (ref 0–0.7)
Eosinophils Relative: 1 %
HCT: 31.2 % — ABNORMAL LOW (ref 35.0–47.0)
Hemoglobin: 10.3 g/dL — ABNORMAL LOW (ref 12.0–16.0)
Lymphocytes Relative: 15 %
Lymphs Abs: 1.5 10*3/uL (ref 1.0–3.6)
MCH: 29.7 pg (ref 26.0–34.0)
MCHC: 32.9 g/dL (ref 32.0–36.0)
MCV: 90.1 fL (ref 80.0–100.0)
Monocytes Absolute: 2.7 10*3/uL — ABNORMAL HIGH (ref 0.2–0.9)
Monocytes Relative: 27 %
Neutro Abs: 5.8 10*3/uL (ref 1.4–6.5)
Neutrophils Relative %: 57 %
Platelets: 25 10*3/uL — CL (ref 150–440)
RBC: 3.46 MIL/uL — ABNORMAL LOW (ref 3.80–5.20)
RDW: 14.7 % — ABNORMAL HIGH (ref 11.5–14.5)
WBC: 10.1 10*3/uL (ref 3.6–11.0)

## 2015-02-06 NOTE — Care Management Important Message (Signed)
Important Message  Patient Details  Name: [name] MRN: [id number] Date of Birth: Jul 08, 1922   Medicare Important Message Given:  Yes-fourth notification given    Alvie Heidelberg, RN 02/06/2015, 10:33 AM

## 2015-02-06 NOTE — Progress Notes (Signed)
Initial Nutrition Assessment   INTERVENTION:   Meals and Snacks: Cater to patient preferences, will send greek yogurt on lunch trays. Medical Food Supplement Therapy: pt likes [employer] Breakfast made with ice cream, will send as 3pm snack.   NUTRITION DIAGNOSIS:   No nutrition diagnosis at this time.  GOAL:   Patient will meet greater than or equal to 90% of their needs  MONITOR:    (Energy Intake, Digestive system, Anemia Profile, Electrolyte and renal Profile)  REASON FOR ASSESSMENT:   LOS    ASSESSMENT:   Pt admitted with sepsis secondary to UTI with low platelets, has been transfused.  Past Medical History  Diagnosis Date  . HTN (hypertension)   . Syncope   . Hyperlipidemia   . Ischemic colitis   . Thrombocytopenia   . Mobitz type 2 second degree heart block   . Anemia   . Pacemaker      Diet Order:  Diet Heart Room service appropriate?: Yes; Fluid consistency:: Thin   Current Nutrition: Pt ate pancakes brought in by family this am.   Food/Nutrition-Related History:  Pt reports good appetite and good appetite PTA as well. Pt eating some meals brought in by family. Pt likes [employer] in the afternoon.   Medications: senokot  Electrolyte/Renal Profile and Glucose Profile:   Recent Labs Lab 02/03/15 0535 02/04/15 0458 02/05/15 0558  NA 135 134* 138  K 3.1* 3.5 3.4*  CL 107 107 107  CO2 23 23 24   BUN 10 9 8   CREATININE 0.79 0.77 0.75  CALCIUM 8.3* 8.0* 8.4*  MG  --  1.8  --   GLUCOSE 111* 127* 109*   Protein Profile:  Recent Labs Lab 01/31/15 1222  ALBUMIN 3.4*    Gastrointestinal Profile: Last BM: 02/05/2015  Weight Change: Per CHL, stable weight  Height:   Ht Readings from Last 1 Encounters:  01/31/15 5\' 1"  (1.549 m)    Weight:   Wt Readings from Last 1 Encounters:  01/31/15 119 lb (53.978 kg)    Wt Readings from Last 10 Encounters:  01/31/15 119 lb (53.978 kg)  01/11/15 120 lb (54.432 kg)    BMI:  Body mass index is 22.5 kg/(m^2).  EDUCATION NEEDS:   No education needs identified at this time   [place], [place], LDN Pager [phone]

## 2015-02-06 NOTE — Progress Notes (Signed)
Physical Therapy Treatment Patient Details Name: Chiniqua Kilcrease MRN: [id number] DOB: April 01, 1923 Today's Date: 02/06/2015    History of Present Illness Pt is a 81 yo female who was admitted to the hospital with UTI and associated fatigue and weakness for the previous two days.     PT Comments    Pt demonstrates decreased LE power and strength but functional for mobility. She refuses ambulation at this time with therapy due to fatigue. Pt had just finished ambulating in room with CNA when PT arrives. CNA reports that pt is stable with rolling walker. Pt is able to perform seated exercises at EOB as well as sit to stand with fatigue and SOB reported. Vitals remain WNL throughout session although HR on the higher end normal throughout session. Pt will benefit from skilled PT services to address deficits in strength, balance, and mobility in order to return to full function at home.    Follow Up Recommendations  Home health PT;Supervision/Assistance - 24 hour     Equipment Recommendations  None recommended by PT    Recommendations for Other Services       Precautions / Restrictions Precautions Precautions: Fall Restrictions Weight Bearing Restrictions: No    Mobility  Bed Mobility Overal bed mobility: Modified Independent             General bed mobility comments: Sit to supine. Pt demonstrates increased time required to perform but is able to complete without assistance  Transfers Overall transfer level: Needs assistance Equipment used: Rolling walker (2 wheeled) Transfers: Sit to/from Stand Sit to Stand: Min guard         General transfer comment: Pt performed sit to stand x 5 without UE support.Cues for anterior weight shifting and to come to full standing. Decreased LE power noted and increased time required to come to standing. Pt reports lack of confidence in LE strength and balance.   Ambulation/Gait             General Gait Details: Deferred at this time. Pt  just finished ambulating with CNA when PT arrives. Pt refuses further ambulation stating she is too fatigued. Encouraged multiple times throughout session but pt continues to refuse   Science writer    Modified Rankin (Stroke Patients Only)       Balance Overall balance assessment: Independent   Sitting balance-Leahy Scale: Good       Standing balance-Leahy Scale: Fair                      Cognition Arousal/Alertness: Awake/alert Behavior During Therapy: WFL for tasks assessed/performed Overall Cognitive Status: Within Functional Limits for tasks assessed                      Exercises General Exercises - Lower Extremity Long Arc Quad: Strengthening;Both;10 reps;Seated Heel Slides: Strengthening;Both;10 reps;Seated Hip ABduction/ADduction: Strengthening;Both;10 reps;Seated Hip Flexion/Marching: Strengthening;Both;10 reps;Seated Heel Raises: Strengthening;Both;10 reps;Seated Other Exercises Other Exercises: Sit to stand x 5 without UE support    General Comments        Pertinent Vitals/Pain Pain Assessment: No/denies pain    Home Living                      Prior Function            PT Goals (current goals can now be found in the care plan section) Acute Rehab PT Goals  Patient Stated Goal: wishes to go home PT Goal Formulation: With patient Time For Goal Achievement: 02/15/15 Potential to Achieve Goals: Good Progress towards PT goals: Progressing toward goals    Frequency  Min 2X/week    PT Plan Current plan remains appropriate    Co-evaluation             End of Session Equipment Utilized During Treatment: Gait belt Activity Tolerance: Patient tolerated treatment well Patient left: in bed;with call bell/phone within reach;with bed alarm set     Time: 1314-1330 PT Time Calculation (min) (ACUTE ONLY): 16 min  Charges:  $Therapeutic Exercise: 8-22 mins                    G Codes:       Lyndel Safe Tessla Spurling PT, DPT   Delesa Kawa 02/06/2015, 3:45 PM

## 2015-02-06 NOTE — Progress Notes (Signed)
[facility] NAME: [name]    MR#:  [id number]  DATE OF BIRTH:  11-Feb-1923  SUBJECTIVE:  Feeling better, remains afebrile, platelet count 25 this am, would like to get her Foley out REVIEW OF SYSTEMS:    Review of Systems  Constitutional: Negative for fever, chills and malaise/fatigue.  HENT: Negative for sore throat.   Eyes: Negative for blurred vision.  Respiratory: Negative for cough, hemoptysis, shortness of breath and wheezing.   Cardiovascular: Negative for chest pain, palpitations and leg swelling.  Gastrointestinal: Negative for nausea, vomiting, abdominal pain, diarrhea and blood in stool.  Genitourinary: Positive for dysuria. Negative for urgency, frequency, hematuria and flank pain.  Musculoskeletal: Negative for back pain.  Neurological: Negative for dizziness, tremors and headaches.  Endo/Heme/Allergies: Does not bruise/bleed easily.    Tolerating Diet:Yes DRUG ALLERGIES:   Allergies  Allergen Reactions  . Atenolol Other (See Comments)    Reaction:  Complete Heart Block  . Penicillins Other (See Comments)    Reaction:  Unknown   . Dilaudid [Hydromorphone Hcl] Rash   VITALS:  Blood pressure 151/96, pulse 103, temperature 96.8 F (36 C), temperature source Axillary, resp. rate 18, height 5\' 1"  (1.549 m), weight 53.978 kg (119 lb), SpO2 99 %. PHYSICAL EXAMINATION:  Physical Exam  Constitutional: She is oriented to person, place, and time and well-developed, well-nourished, and in no distress. No distress.  HENT:  Head: Normocephalic.  Eyes: No scleral icterus.  Neck: Normal range of motion. Neck supple. No JVD present. No tracheal deviation present.  Cardiovascular: Normal rate, regular rhythm and normal heart sounds.  Exam reveals no gallop and no friction rub.   No murmur heard. Pulmonary/Chest: Effort normal and breath sounds normal. No respiratory distress. She has no wheezes. She has no rales.  She exhibits no tenderness.  Abdominal: Soft. Bowel sounds are normal. She exhibits no distension and no mass. There is no tenderness. There is no rebound and no guarding.  Musculoskeletal: Normal range of motion. She exhibits no edema.  Neurological: She is alert and oriented to person, place, and time.  Skin: Skin is warm. No rash noted. No erythema.  Psychiatric: Affect and judgment normal.   LABORATORY PANEL:   CBC  Recent Labs Lab 02/06/15 0419  WBC 10.1  HGB 10.3*  HCT 31.2*  PLT 25*   ------------------------------------------------------------------------------------------------------------------  Chemistries   Recent Labs Lab 01/31/15 1222  02/04/15 0458 02/05/15 0558  NA 129*  < > 134* 138  K 3.8  < > 3.5 3.4*  CL 97*  < > 107 107  CO2 19*  < > 23 24  GLUCOSE 145*  < > 127* 109*  BUN 12  < > 9 8  CREATININE 1.13*  < > 0.77 0.75  CALCIUM 8.8*  < > 8.0* 8.4*  MG  --   --  1.8  --   AST 23  --   --   --   ALT 15  --   --   --   ALKPHOS 72  --   --   --   BILITOT 1.2  --   --   --   < > = values in this interval not displayed.  RADIOLOGY:  US Renal  02/05/2015   CLINICAL DATA:  Followup left hydronephrosis.  EXAM: RENAL / URINARY TRACT ULTRASOUND COMPLETE  COMPARISON:  Abdomen and pelvis CT dated 02/01/2015 and 01/13/2013.  FINDINGS: Right Kidney:  Length: 10.6 cm.  Echogenicity within normal limits. No mass or hydronephrosis visualized.  Left Kidney:  Length: 12.5 cm. Normal echogenicity. Moderate dilatation of the left renal collecting system with no significant change.  Bladder:  Foley catheter in the bladder with minimal urine present.  IMPRESSION: Stable moderate left hydronephrosis.   Electronically Signed   By: Claudie Revering M.D.   On: 02/05/2015 13:20   ASSESSMENT AND PLAN:   This is a very pleasant 81 year old female with a history of essential hypertension and recent Escherichia coli bacteremia secondary to urinary tract infection who presents again with  sepsis secondary to urinary tract infection and gram-negative rod bacteremia.  1.  Escherichia coli Sepsis: This is likely secondary to urinary tract infection.  Continue IV Rocephin.  Appreciate infectious disease input  2.  T12 compression fracture: Appreciate orthopedic input.  No intervention required. Likely sub-acute/chronic. Not symptomatic.  3. Thrombocytopenia: Acute on chronic.  Appreciate oncology input, his platelet went from 12->6->7->20->25 and required 2 unit of platelet transfusion so far.  Appreciate oncology input.  Her smear is worrisome for possible myeloproliferative/myelodysplastic disorder such as CMML - likely need outpatient follow-up  4. Leukocytosis: CBC is trending down.  WBC is 10.1  5. Essential hypertension: Continue Norvasc  6.  Hypokalemia: Replete and recheck, check Magnesium  7.  Hydronephrosis and urinary retention: Appreciate urology input, Foley is draining well, repeat ultrasound still showing hydronephrosis.  Patient is requesting for a catheter removed   Management plans discussed with the patient, family and they are in agreement.  CODE STATUS: DNR   TOTAL TIME TAKING CARE OF THIS PATIENT: 30 minutes.    POSSIBLE D/C in a.m., DEPENDING ON CLINICAL CONDITION.   [facility], Coley Littles M.D on 02/06/2015 at 1:07 PM  Between 7am to 6pm - Pager - [phone] After 6pm go to [email or web address] - password [email or web address] [facility]  [facility]  Office  [phone]  CC: Primary care physician; Tommi Rumps, MD

## 2015-02-07 LAB — CBC WITH DIFFERENTIAL/PLATELET
Basophils Absolute: 0 10*3/uL (ref 0–0.1)
Basophils Relative: 0 %
Eosinophils Absolute: 0.1 10*3/uL (ref 0–0.7)
Eosinophils Relative: 1 %
HCT: 30 % — ABNORMAL LOW (ref 35.0–47.0)
Hemoglobin: 10.2 g/dL — ABNORMAL LOW (ref 12.0–16.0)
Lymphocytes Relative: 17 %
Lymphs Abs: 1.5 10*3/uL (ref 1.0–3.6)
MCH: 30.3 pg (ref 26.0–34.0)
MCHC: 33.9 g/dL (ref 32.0–36.0)
MCV: 89.5 fL (ref 80.0–100.0)
Monocytes Absolute: 2.1 10*3/uL — ABNORMAL HIGH (ref 0.2–0.9)
Monocytes Relative: 24 %
Neutro Abs: 5.2 10*3/uL (ref 1.4–6.5)
Neutrophils Relative %: 58 %
Platelets: 43 10*3/uL — ABNORMAL LOW (ref 150–440)
RBC: 3.35 MIL/uL — ABNORMAL LOW (ref 3.80–5.20)
RDW: 14.8 % — ABNORMAL HIGH (ref 11.5–14.5)
WBC: 8.9 10*3/uL (ref 3.6–11.0)

## 2015-02-07 LAB — PROTEIN ELECTROPHORESIS, SERUM
A/G Ratio: 0.9 (ref 0.7–1.7)
Albumin ELP: 2.4 g/dL — ABNORMAL LOW (ref 2.9–4.4)
Alpha-1-Globulin: 0.4 g/dL (ref 0.0–0.4)
Alpha-2-Globulin: 0.7 g/dL (ref 0.4–1.0)
Beta Globulin: 0.8 g/dL (ref 0.7–1.3)
Gamma Globulin: 0.8 g/dL (ref 0.4–1.8)
Globulin, Total: 2.7 g/dL (ref 2.2–3.9)
Total Protein ELP: 5.1 g/dL — ABNORMAL LOW (ref 6.0–8.5)

## 2015-02-07 LAB — BASIC METABOLIC PANEL
ANION GAP: 5 (ref 5–15)
BUN: 10 mg/dL (ref 6–20)
CALCIUM: 8.7 mg/dL — AB (ref 8.9–10.3)
CO2: 24 mmol/L (ref 22–32)
Chloride: 106 mmol/L (ref 101–111)
Creatinine, Ser: 0.82 mg/dL (ref 0.44–1.00)
GFR calc Af Amer: >60 mL/min (ref >60)
GLUCOSE: 111 mg/dL — AB (ref 65–99)
POTASSIUM: 4 mmol/L (ref 3.5–5.1)
Sodium: 135 mmol/L (ref 135–145)

## 2015-02-07 MED ORDER — SULFAMETHOXAZOLE-TRIMETHOPRIM 800-160 MG PO TABS: 1.0000 | ORAL_TABLET | Freq: Two times a day (BID) | ORAL | Status: DC

## 2015-02-07 NOTE — Care Management (Signed)
Spoke with patient and daughter concerning home health. Daughter stated that the patient had had [facility] in the past and would like to continue with them. Contacted Floydene Flock at [facility] health for referral. Referral accepted. No other CM need identified at this time. Discharge today.

## 2015-02-07 NOTE — Progress Notes (Signed)
[facility]  Date of admission:  01/31/2015  Inpatient day:  02/07/2015  Chief Complaint: [name] is a 81 y.o. female with history of recurrent E coli UTI and thrombocytopenia who was admitted with sepsis secondary to a recurrent UTI.  Subjective: Patient denies any complaints.  She has started to work with physical therapy.  She is being discharged home today.  Past Medical History  Diagnosis Date  . HTN (hypertension)   . Syncope   . Hyperlipidemia   . Ischemic colitis   . Thrombocytopenia   . Mobitz type 2 second degree heart block   . Anemia   . Pacemaker     Past Surgical History  Procedure Laterality Date  . Left hip replacement     . Cholecystectomy      Family History  Problem Relation Age of Onset  . Stroke Mother     Social History:  reports that she has quit smoking. She has never used smokeless tobacco. She reports that she does not drink alcohol or use illicit drugs.  She lives with her daughter in [place], [place].  She is accompanied by her daughter today.  Allergies:  Allergies  Allergen Reactions  . Atenolol Other (See Comments)    Reaction:  Complete Heart Block  . Penicillins Other (See Comments)    Reaction:  Unknown   . Dilaudid [Hydromorphone Hcl] Rash    No prescriptions prior to admission    Review of Systems: GENERAL:  General fatigue.  No fevers or sweats.  Weight stable. PERFORMANCE STATUS (ECOG):  2 HEENT:  No visual changes, runny nose, sore throat, mouth sores or tenderness. Lungs: No shortness of breath or cough.  No hemoptysis. Cardiac:  No chest pain, palpitations, orthopnea, or PND. GI:  Appetite improved.  No nausea, vomiting, diarrhea, constipation, melena or hematochezia. GU:  No urgency, frequency, dysuria, or hematuria. Musculoskeletal:  Back and hip pain.  No muscle tenderness. Extremities:  No pain or swelling. Skin:  No rashes or skin changes. Neuro:  No headache, numbness or weakness, balance or  coordination issues. Endocrine:  No diabetes, thyroid issues, hot flashes or night sweats. Psych:  No mood changes, depression or anxiety. Pain:  No focal pain. Review of systems:  All other systems reviewed and found to be negative.  Physical Exam:  Blood pressure 140/66, pulse 91, temperature 98.6 F (37 C), temperature source Oral, resp. rate 17, height $RemoveBe'5\' 1"'uCYzEmejE$  (1.549 m), weight 119 lb (53.978 kg), SpO2 98 %.  GENERAL:  Elderly woman lying comfortably on the medical unit in no acute distress eating [place] chicken with her daughter. MENTAL STATUS:  Alert and oriented to person, place and time. HEAD:  Blonde hair.  Normocephalic, atraumatic, face symmetric, no Cushingoid features. EYES:  Glasses.  Brown eyes.  Pupils equal round and reactive to light and accomodation.  No conjunctivitis or scleral icterus. ENT:  Dentures.  Oropharynx clear without lesion.  No palatal petechiae.  No nasal mucosa bleeding. RESPIRATORY:  Clear to auscultation without rales, wheezes or rhonchi. CARDIOVASCULAR:  Regular rate and rhythm without murmur, rub or gallop. ABDOMEN:  Soft, non-tender, with active bowel sounds, and no hepatosplenomegaly.  No masses. SKIN:  Mo petechiae.  No rashes, ulcers or lesions. EXTREMITIES: No edema, no skin discoloration or tenderness.  No palpable cords. LYMPH NODES: No palpable cervical, supraclavicular, axillary or inguinal adenopathy  NEUROLOGICAL: Unremarkable. PSYCH:  Appropriate.  Results for orders placed or performed during the hospital encounter of 01/31/15 (from the  past 48 hour(s))  CBC with Differential     Status: Abnormal   Collection Time: 02/06/15  4:19 AM  Result Value Ref Range   WBC 10.1 3.6 - 11.0 K/uL   RBC 3.46 (L) 3.80 - 5.20 MIL/uL   Hemoglobin 10.3 (L) 12.0 - 16.0 g/dL   HCT 31.2 (L) 35.0 - 47.0 %   MCV 90.1 80.0 - 100.0 fL   MCH 29.7 26.0 - 34.0 pg   MCHC 32.9 32.0 - 36.0 g/dL   RDW 14.7 (H) 11.5 - 14.5 %   Platelets 25 (LL) 150 - 440  K/uL    Comment: CRITICAL VALUE NOTED.  VALUE IS CONSISTENT WITH PREVIOUSLY REPORTED AND CALLED VALUE.   Neutrophils Relative % 57 %   Lymphocytes Relative 15 %   Monocytes Relative 27 %   Eosinophils Relative 1 %   Basophils Relative 0 %   Neutro Abs 5.8 1.4 - 6.5 K/uL   Lymphs Abs 1.5 1.0 - 3.6 K/uL   Monocytes Absolute 2.7 (H) 0.2 - 0.9 K/uL   Eosinophils Absolute 0.1 0 - 0.7 K/uL   Basophils Absolute 0.0 0 - 0.1 K/uL   Smear Review MORPHOLOGY UNREMARKABLE   CBC with Differential     Status: Abnormal   Collection Time: 02/07/15  5:49 AM  Result Value Ref Range   WBC 8.9 3.6 - 11.0 K/uL   RBC 3.35 (L) 3.80 - 5.20 MIL/uL   Hemoglobin 10.2 (L) 12.0 - 16.0 g/dL   HCT 30.0 (L) 35.0 - 47.0 %   MCV 89.5 80.0 - 100.0 fL   MCH 30.3 26.0 - 34.0 pg   MCHC 33.9 32.0 - 36.0 g/dL   RDW 14.8 (H) 11.5 - 14.5 %   Platelets 43 (L) 150 - 440 K/uL   Neutrophils Relative % 58 %   Lymphocytes Relative 17 %   Monocytes Relative 24 %   Eosinophils Relative 1 %   Basophils Relative 0 %   Neutro Abs 5.2 1.4 - 6.5 K/uL   Lymphs Abs 1.5 1.0 - 3.6 K/uL   Monocytes Absolute 2.1 (H) 0.2 - 0.9 K/uL   Eosinophils Absolute 0.1 0 - 0.7 K/uL   Basophils Absolute 0.0 0 - 0.1 K/uL   Smear Review MORPHOLOGY UNREMARKABLE   Basic metabolic panel     Status: Abnormal   Collection Time: 02/07/15  5:49 AM  Result Value Ref Range   Sodium 135 135 - 145 mmol/L   Potassium 4.0 3.5 - 5.1 mmol/L   Chloride 106 101 - 111 mmol/L   CO2 24 22 - 32 mmol/L   Glucose, Bld 111 (H) 65 - 99 mg/dL   BUN 10 6 - 20 mg/dL   Creatinine, Ser 0.82 0.44 - 1.00 mg/dL   Calcium 8.7 (L) 8.9 - 10.3 mg/dL   GFR calc non Af Amer >60 >60 mL/min   GFR calc Af Amer >60 >60 mL/min    Comment: (NOTE) The eGFR has been calculated using the CKD EPI equation. This calculation has not been validated in all clinical situations. eGFR's persistently <60 mL/min signify possible Chronic Kidney Disease.    Anion gap 5 5 - 15   No results  found.  Assessment:  The patient is a 81 y.o. woman with recurrent E coli UTI and associated bacteremia.  She has a history of thrombocytopenia secondary to consumption associated with infection.  She likely has poor marrow reserve secondary to her age.  In addition, she was recently on a course of  Septra, known myelosuppressant agent, prior to admission.  Peripheral smear reveals marked thrombocytopenia, scattered teardrop cells, and rare schistocytes.  Creatinine is at baseline.  No evidence of TTP.  MAR with notation of discontinuation of heparin, but unclear if patient received (doubt HIT).  Coagulation studies r/o DIC.  Diet is modest.  She has been on no herbal products.  She denies hepatitis or prior transfusion.  Plan:   1.  Hematology/Oncology:  No bleeding or brusing.  Patient has received 2 platelet transfusions with this admission (last 02/04/2015).  Platelets are now self sustaining with resolution of infection.  Discussed with patient and daughter possible implication of monocytosis (CMML).  Blood pening for BCR-ABL and flow cytometry.  MMA also pending to rule out B12 deficiency.  Additional work-up pending (SPEP, free light chains, ANA).  Anticipate follow-up in outpatient department next week to review pending labs.    2.  Infectious disease:  Pyelonephritis and bacteremia, resolving.  No further positive blood cultures.  Echo negative.  Thank you for allowing me to participate in Edona Schreffler 's care.  I will follow her in the outpatient department next week after discharge.    Lequita Asal, MD

## 2015-02-07 NOTE — Progress Notes (Signed)
Alert and oriented. VSS. No signs of acute distress. Discharge instructions given. Patient verbalized understanding. No complaints.

## 2015-02-07 NOTE — Discharge Summary (Signed)
[facility] NAME: [name]    MR#:  [id number]  DATE OF BIRTH:  06/27/1922  DATE OF ADMISSION:  01/31/2015 ADMITTING PHYSICIAN: Vaughan Basta, MD  DATE OF DISCHARGE: No discharge date for patient encounter.  PRIMARY CARE PHYSICIAN: Tommi Rumps, MD    ADMISSION DIAGNOSIS:  Acute respiratory failure with hypoxia [J96.01] Cystitis [N30.90] Sepsis, due to unspecified organism [A41.9]  DISCHARGE DIAGNOSIS:  Active Problems:   UTI (lower urinary tract infection)   SECONDARY DIAGNOSIS:   Past Medical History  Diagnosis Date  . HTN (hypertension)   . Syncope   . Hyperlipidemia   . Ischemic colitis   . Thrombocytopenia   . Mobitz type 2 second degree heart block   . Anemia   . Pacemaker    HOSPITAL COURSE:  81 year old female with a history of essential hypertension and recent Escherichia coli bacteremia secondary to urinary tract infection who presents again for same.  Please see Dr. Raliegh Ip dictated history and physical for further details.  Infectious disease consultation was obtained with Dr. Ola Spurr who recommended urology consult, considering patient having hydronephrosis, possible renal [name].  Urology recommended Foley catheter placement which was done.  Patient was urinating well.  She was also noted to have a T12 compression fracture as an incidental finding on CT abdomen.  Orthopedic consultation was obtained.  Dr. Vernell Barrier who did not recommend any further intervention as this was thought to be subacute or chronic as patient was not symptomatically from same.  Patient also had significant acute thrombocytopenia while in the hospital.  Requiring 2 units of platelets.  Not having any bleeding, although her platelet count had dropped down to 6000 and 7000, at times, this was thought to be secondary to sepsis although considering significant severe, thrombocytopenia, oncology consultation was obtained.   Recommended outpatient follow-up to evaluate for possible myeloproliferative or myelodysplastic syndrome such as CMML.  1. Escherichia coli Sepsis: This is likely secondary to urinary tract infection.Treated with IV antibiotics while in the hospital and recommended total 14 days course.  She started on Bactrim DS on D/C.  2.Chronic T12 compression fracture: No intervention required. Not symptomatic.  3. Thrombocytopenia: Acute on chronic. Appreciate oncology input, his platelet went from 12->6->7->20->25->43 and required 2 unit of platelet transfusion so far. Appreciate oncology input. Her smear is worrisome for possible myeloproliferative/myelodysplastic disorder such as CMML although this all could be just from sepsis also - likely need outpatient follow-up  4. Leukocytosis: Resolved   5. Essential hypertension: Continue Norvasc  6. Hypokalemia: Replete and recheck, check Magnesium  7. Hydronephrosis and urinary retention: Appreciate urology input, recommended keeping the Foley indwelling.  The patient is refusing and had been removed at this point, we will have her follow up outpatient with them.  Patient was evaluated by physical therapy and was recommended home health, which was set up.  She is feeling much better, had been afebrile and is agreeable with the discharge plan and so is her family.  She is being discharged home in stable condition. DISCHARGE CONDITIONS:  Stable CONSULTS OBTAINED:  Treatment Team:  Adrian Prows, MD Hessie Knows, MD Lequita Asal, MD Cleon Gustin, MD DRUG ALLERGIES:   Allergies  Allergen Reactions  . Atenolol Other (See Comments)    Reaction:  Complete Heart Block  . Penicillins Other (See Comments)    Reaction:  Unknown   . Dilaudid [Hydromorphone Hcl] Rash   DISCHARGE MEDICATIONS:   Current Discharge Medication  List    START taking these medications   Details  sulfamethoxazole-trimethoprim (BACTRIM DS) 800-160 MG per  tablet Take 1 tablet by mouth 2 (two) times daily. Qty: 12 tablet, Refills: 0      CONTINUE these medications which have NOT CHANGED   Details  amLODipine (NORVASC) 5 MG tablet Take 5 mg by mouth daily.     aspirin EC 81 MG tablet Take 81 mg by mouth daily.    carvedilol (COREG) 12.5 MG tablet Take 12.5 mg by mouth 2 (two) times daily.    furosemide (LASIX) 20 MG tablet Take 10 mg by mouth daily as needed for edema.     hydrALAZINE (APRESOLINE) 10 MG tablet Take 10 mg by mouth 2 (two) times daily.     HYDROcodone-acetaminophen (NORCO/VICODIN) 5-325 MG per tablet Take 1 tablet by mouth 4 (four) times daily as needed for moderate pain.    latanoprost (XALATAN) 0.005 % ophthalmic solution Place 1 drop into both eyes at bedtime.    losartan (COZAAR) 100 MG tablet Take 50 mg by mouth 2 (two) times daily.    Multiple Vitamin (MULTIVITAMIN WITH MINERALS) TABS tablet Take 1 tablet by mouth daily.    Multiple Vitamins-Minerals (PRESERVISION AREDS 2) CAPS Take 2 capsules by mouth daily.    Omega-3 Fatty Acids (FISH OIL) 1000 MG CAPS Take 2,000 mg by mouth daily.       STOP taking these medications     acyclovir ointment (ZOVIRAX) 5 %      sulfamethoxazole-trimethoprim (BACTRIM) 400-80 MG per tablet        DISCHARGE INSTRUCTIONS:   DIET:  Renal diet DISCHARGE CONDITION:  Good ACTIVITY:  Activity as tolerated OXYGEN:  Home Oxygen: No.  Oxygen Delivery: room air DISCHARGE LOCATION:  home with home health   If you experience worsening of your admission symptoms, develop shortness of breath, life threatening emergency, suicidal or homicidal thoughts you must seek medical attention immediately by calling 911 or calling your MD immediately  if symptoms less severe.  You Must read complete instructions/literature along with all the possible adverse reactions/side effects for all the Medicines you take and that have been prescribed to you. Take any new Medicines after you have  completely understood and accpet all the possible adverse reactions/side effects.   Please note  You were cared for by a hospitalist during your hospital stay. If you have any questions about your discharge medications or the care you received while you were in the hospital after you are discharged, you can call the unit and asked to speak with the hospitalist on call if the hospitalist that took care of you is not available. Once you are discharged, your primary care physician will handle any further medical issues. Please note that NO REFILLS for any discharge medications will be authorized once you are discharged, as it is imperative that you return to your primary care physician (or establish a relationship with a primary care physician if you do not have one) for your aftercare needs so that they can reassess your need for medications and monitor your lab values.    On the day of Discharge: VITAL SIGNS:  Blood pressure 140/66, pulse 91, temperature 98.6 F (37 C), temperature source Oral, resp. rate 17, height 5\' 1"  (1.549 m), weight 53.978 kg (119 lb), SpO2 98 %. PHYSICAL EXAMINATION:  GENERAL:  81 y.o.-year-old patient lying in the bed with no acute distress.  EYES: Pupils equal, round, reactive to light and accommodation. No scleral icterus.  Extraocular muscles intact.  HEENT: Head atraumatic, normocephalic. Oropharynx and nasopharynx clear.  NECK:  Supple, no jugular venous distention. No thyroid enlargement, no tenderness.  LUNGS: Normal breath sounds bilaterally, no wheezing, rales,rhonchi or crepitation. No use of accessory muscles of respiration.  CARDIOVASCULAR: S1, S2 normal. No murmurs, rubs, or gallops.  ABDOMEN: Soft, non-tender, non-distended. Bowel sounds present. No organomegaly or mass.  EXTREMITIES: No pedal edema, cyanosis, or clubbing.  NEUROLOGIC: Cranial nerves II through XII are intact. Muscle strength 5/5 in all extremities. Sensation intact. Gait not checked.   PSYCHIATRIC: The patient is alert and oriented x 3.  SKIN: No obvious rash, lesion, or ulcer.  DATA REVIEW:   CBC  Recent Labs Lab 02/07/15 0549  WBC 8.9  HGB 10.2*  HCT 30.0*  PLT 43*    Chemistries   Recent Labs Lab 02/04/15 0458  02/07/15 0549  NA 134*  < > 135  K 3.5  < > 4.0  CL 107  < > 106  CO2 23  < > 24  GLUCOSE 127*  < > 111*  BUN 9  < > 10  CREATININE 0.77  < > 0.82  CALCIUM 8.0*  < > 8.7*  MG 1.8  --   --   < > = values in this interval not displayed.  Cardiac Enzymes No results for input(s): TROPONINI in the last 168 hours.  Microbiology Results  Results for orders placed or performed during the hospital encounter of 01/31/15  Blood Culture (routine x 2)     Status: None   Collection Time: 01/31/15 11:20 AM  Result Value Ref Range Status   Specimen Description BLOOD RIGHT HAND  Final   Special Requests   Final    BOTTLES DRAWN AEROBIC AND ANAEROBIC 3CC AEROBIC,3CC ANAEROBIC   Culture  Setup Time   Final    GRAM NEGATIVE RODS IN BOTH AEROBIC AND ANAEROBIC BOTTLES CRITICAL RESULT CALLED TO, READ BACK BY AND VERIFIED WITH: RENICIA GRAVES 02/01/2015 0[address] [place] CONFIRMED BY AJO    Culture   Final    ESCHERICHIA COLI IN BOTH AEROBIC AND ANAEROBIC BOTTLES    Report Status 02/02/2015 FINAL  Final   Organism ID, Bacteria ESCHERICHIA COLI  Final      Susceptibility   Escherichia coli - MIC*    AMPICILLIN >=32 RESISTANT Resistant     CEFTAZIDIME <=1 SENSITIVE Sensitive     CEFAZOLIN <=4 SENSITIVE Sensitive     CEFTRIAXONE <=1 SENSITIVE Sensitive     CIPROFLOXACIN >=4 RESISTANT Resistant     GENTAMICIN >=16 RESISTANT Resistant     IMIPENEM <=0.25 SENSITIVE Sensitive     TRIMETH/SULFA <=20 SENSITIVE Sensitive     PIP/TAZO Value in next row Sensitive      SENSITIVE<=4    * ESCHERICHIA COLI  Blood Culture (routine x 2)     Status: None   Collection Time: 01/31/15 11:25 AM  Result Value Ref Range Status   Specimen Description BLOOD LEFT ASSIST  CONTROL  Final   Special Requests   Final    BOTTLES DRAWN AEROBIC AND ANAEROBIC 2CC AEROBIC,2CC ANAEROBIC   Culture  Setup Time   Final    GRAM NEGATIVE RODS IN BOTH AEROBIC AND ANAEROBIC BOTTLES CRITICAL RESULT CALLED TO, READ BACK BY AND VERIFIED WITH: RENICIA GRAVES 02/01/2015 [address] [place] CONFIRMED BY AJO    Culture   Final    ESCHERICHIA COLI IN BOTH AEROBIC AND ANAEROBIC BOTTLES    Report Status 02/02/2015 FINAL  Final  Organism ID, Bacteria ESCHERICHIA COLI  Final      Susceptibility   Escherichia coli - MIC*    AMPICILLIN >=32 RESISTANT Resistant     CEFTAZIDIME <=1 SENSITIVE Sensitive     CEFAZOLIN <=4 SENSITIVE Sensitive     CEFTRIAXONE <=1 SENSITIVE Sensitive     CIPROFLOXACIN >=4 RESISTANT Resistant     GENTAMICIN >=16 RESISTANT Resistant     IMIPENEM <=0.25 SENSITIVE Sensitive     TRIMETH/SULFA <=20 SENSITIVE Sensitive     PIP/TAZO Value in next row Sensitive      SENSITIVE<=4    * ESCHERICHIA COLI  Urine culture     Status: None   Collection Time: 01/31/15 12:22 PM  Result Value Ref Range Status   Specimen Description URINE, CLEAN CATCH  Final   Special Requests NONE  Final   Culture >=100,000 COLONIES/mL ESCHERICHIA COLI  Final   Report Status 02/02/2015 FINAL  Final   Organism ID, Bacteria ESCHERICHIA COLI  Final      Susceptibility   Escherichia coli - MIC*    AMPICILLIN >=32 RESISTANT Resistant     CEFAZOLIN <=4 SENSITIVE Sensitive     CEFTRIAXONE <=1 SENSITIVE Sensitive     CIPROFLOXACIN >=4 RESISTANT Resistant     GENTAMICIN >=16 RESISTANT Resistant     IMIPENEM <=0.25 SENSITIVE Sensitive     NITROFURANTOIN <=16 SENSITIVE Sensitive     TRIMETH/SULFA <=20 SENSITIVE Sensitive     PIP/TAZO Value in next row Sensitive      SENSITIVE<=4    * >=100,000 COLONIES/mL ESCHERICHIA COLI  Urine culture     Status: None   Collection Time: 02/02/15  2:11 PM  Result Value Ref Range Status   Specimen Description URINE, RANDOM  Final   Special Requests NONE  Final    Culture NO GROWTH 2 DAYS  Final   Report Status 02/04/2015 FINAL  Final  Culture, blood (routine x 2)     Status: None (Preliminary result)   Collection Time: 02/04/15 10:44 AM  Result Value Ref Range Status   Specimen Description BLOOD LEFT SIDE  Final   Special Requests   Final    BOTTLES DRAWN AEROBIC AND ANAEROBIC 5ML ANAEROBIC 8ML AEROBIC   Culture NO GROWTH 3 DAYS  Final   Report Status PENDING  Incomplete  Culture, blood (routine x 2)     Status: None (Preliminary result)   Collection Time: 02/04/15 10:48 AM  Result Value Ref Range Status   Specimen Description BLOOD RIGHT SIDE  Final   Special Requests   Final    BOTTLES DRAWN AEROBIC AND ANAEROBIC 13ML AEROBIC 9ML ANAEROBIC   Culture NO GROWTH 3 DAYS  Final   Report Status PENDING  Incomplete    Management plans discussed with the patient, family and they are in agreement.  CODE STATUS: DO NOT RESUSCITATE  TOTAL TIME TAKING CARE OF THIS PATIENT: 55 minutes.    [facility], Alix Lahmann M.D on 02/07/2015 at 1:21 PM  Between 7am to 6pm - Pager - [phone]  After 6pm go to [email or web address] - password [email or web address] [place] [facility]  Office  [phone]  CC: Primary care physician; Tommi Rumps, MD Adrian Prows, MD Hessie Knows, MD Lequita Asal, MD Cleon Gustin, MD

## 2015-02-08 ENCOUNTER — Telehealth: Payer: Self-pay

## 2015-02-08 LAB — COMP PANEL: LEUKEMIA/LYMPHOMA

## 2015-02-08 LAB — BCR-ABL1, CML/ALL, PCR, QUANT

## 2015-02-08 LAB — KAPPA/LAMBDA LIGHT CHAINS
Kappa free light chain: 37.49 mg/L — ABNORMAL HIGH (ref 3.30–19.40)
Kappa, lambda light chain ratio: 1.68 — ABNORMAL HIGH (ref 0.26–1.65)
Lambda free light chains: 22.26 mg/L (ref 5.71–26.30)

## 2015-02-08 LAB — ANTINUCLEAR ANTIBODIES, IFA: ANA Ab, IFA: NEGATIVE

## 2015-02-08 NOTE — Telephone Encounter (Signed)
Transition Care Management Follow-up Telephone Call   Date discharged?02/07/15   How have you been since you were released from the hospital? Doing okay, but worried about an infection returning.   Do you understand why you were in the hospital? Yes   Do you understand the discharge instructions? Yes   Where were you discharged to? Home   Items Reviewed:  Medications reviewed: Yes  Allergies reviewed: Yes  Dietary changes reviewed: Yes  Referrals reviewed: Yes   Functional Questionnaire:   Activities of Daily Living (ADLs):   She states they are independent in the following: Toileting, bathing, dressing, self feeds.  Home Health to assist with upcoming needs.  Resides with daughter. States they require assistance with the following: Ambulates with cane. Uses wheelchair.    Any transportation issues/concerns?: No, daughter Tomi Bamberger) to bring.   Any patient concerns? Need Urology referral. Anxious about upcoming oncology visit 02/10/15 with Dr. Nolon Stalls.   Confirmed importance and date/time of follow-up visits scheduled Yes, appointment made 02/09/15.  Provider Appointment booked with Dr. Caryl Bis (PCP).  Confirmed with patient if condition begins to worsen call PCP or go to the ER.  Patient was given the office number and encouraged to call back with question or concerns.  : Yes, patient's daughter Tomi Bamberger) verbalized understanding.

## 2015-02-09 ENCOUNTER — Encounter: Payer: Self-pay | Admitting: Family Medicine

## 2015-02-09 ENCOUNTER — Encounter: Payer: Self-pay | Admitting: Emergency Medicine

## 2015-02-09 ENCOUNTER — Ambulatory Visit (INDEPENDENT_AMBULATORY_CARE_PROVIDER_SITE_OTHER): Payer: Medicare Other | Admitting: Family Medicine

## 2015-02-09 ENCOUNTER — Emergency Department
Admission: EM | Admit: 2015-02-09 | Discharge: 2015-02-09 | Disposition: A | Discharge status: Home / Self Care | Payer: Medicare Other | Attending: Student | Admitting: Student

## 2015-02-09 ENCOUNTER — Other Ambulatory Visit: Payer: Self-pay | Admitting: Hematology and Oncology

## 2015-02-09 VITALS — BP 124/72 | HR 78 | Temp 99.1°F | Ht 61.0 in | Wt 119.0 lb

## 2015-02-09 DIAGNOSIS — R339 Retention of urine, unspecified: Secondary | ICD-10-CM | POA: Diagnosis not present

## 2015-02-09 DIAGNOSIS — E538 Deficiency of other specified B group vitamins: Secondary | ICD-10-CM | POA: Insufficient documentation

## 2015-02-09 DIAGNOSIS — L989 Disorder of the skin and subcutaneous tissue, unspecified: Secondary | ICD-10-CM

## 2015-02-09 DIAGNOSIS — Z79899 Other long term (current) drug therapy: Secondary | ICD-10-CM | POA: Diagnosis not present

## 2015-02-09 DIAGNOSIS — R238 Other skin changes: Secondary | ICD-10-CM

## 2015-02-09 DIAGNOSIS — D72821 Monocytosis (symptomatic): Secondary | ICD-10-CM | POA: Insufficient documentation

## 2015-02-09 DIAGNOSIS — R350 Frequency of micturition: Secondary | ICD-10-CM | POA: Diagnosis not present

## 2015-02-09 DIAGNOSIS — D696 Thrombocytopenia, unspecified: Secondary | ICD-10-CM | POA: Insufficient documentation

## 2015-02-09 DIAGNOSIS — N1330 Unspecified hydronephrosis: Secondary | ICD-10-CM | POA: Diagnosis not present

## 2015-02-09 DIAGNOSIS — N390 Urinary tract infection, site not specified: Secondary | ICD-10-CM

## 2015-02-09 DIAGNOSIS — Z87891 Personal history of nicotine dependence: Secondary | ICD-10-CM | POA: Diagnosis not present

## 2015-02-09 DIAGNOSIS — I10 Essential (primary) hypertension: Secondary | ICD-10-CM | POA: Diagnosis not present

## 2015-02-09 DIAGNOSIS — Z880 Allergy status to penicillin: Secondary | ICD-10-CM | POA: Insufficient documentation

## 2015-02-09 DIAGNOSIS — Z7982 Long term (current) use of aspirin: Secondary | ICD-10-CM | POA: Insufficient documentation

## 2015-02-09 DIAGNOSIS — L89152 Pressure ulcer of sacral region, stage 2: Secondary | ICD-10-CM | POA: Insufficient documentation

## 2015-02-09 DIAGNOSIS — R338 Other retention of urine: Secondary | ICD-10-CM

## 2015-02-09 LAB — URINALYSIS COMPLETE WITH MICROSCOPIC (ARMC ONLY)
Bilirubin Urine: NEGATIVE
Glucose, UA: NEGATIVE mg/dL
HGB URINE DIPSTICK: NEGATIVE
KETONES UR: NEGATIVE mg/dL
NITRITE: NEGATIVE
PROTEIN: NEGATIVE mg/dL
SPECIFIC GRAVITY, URINE: 1.003 — AB (ref 1.005–1.030)
WBC UA: NONE SEEN WBC/hpf (ref 0–5)
pH: 6 (ref 5.0–8.0)

## 2015-02-09 LAB — CBC WITH DIFFERENTIAL/PLATELET
BASOS ABS: 0 10*3/uL (ref 0–0.1)
Eosinophils Absolute: 0.1 10*3/uL (ref 0–0.7)
Eosinophils Relative: 1 %
HEMATOCRIT: 28.8 % — AB (ref 35.0–47.0)
HEMOGLOBIN: 9.6 g/dL — AB (ref 12.0–16.0)
Lymphs Abs: 1.2 10*3/uL (ref 1.0–3.6)
MCH: 30.1 pg (ref 26.0–34.0)
MCHC: 33.2 g/dL (ref 32.0–36.0)
MCV: 90.7 fL (ref 80.0–100.0)
Monocytes Absolute: 2 10*3/uL — ABNORMAL HIGH (ref 0.2–0.9)
Monocytes Relative: 22 %
NEUTROS ABS: 5.7 10*3/uL (ref 1.4–6.5)
Platelets: 90 10*3/uL — ABNORMAL LOW (ref 150–440)
RBC: 3.17 MIL/uL — AB (ref 3.80–5.20)
RDW: 14.7 % — AB (ref 11.5–14.5)
WBC: 9 10*3/uL (ref 3.6–11.0)

## 2015-02-09 LAB — CULTURE, BLOOD (ROUTINE X 2)
Culture: NO GROWTH
Culture: NO GROWTH

## 2015-02-09 LAB — BASIC METABOLIC PANEL
Anion gap: 9 (ref 5–15)
BUN: 9 mg/dL (ref 6–20)
CHLORIDE: 102 mmol/L (ref 101–111)
CO2: 23 mmol/L (ref 22–32)
CREATININE: 1.04 mg/dL — AB (ref 0.44–1.00)
Calcium: 8.8 mg/dL — ABNORMAL LOW (ref 8.9–10.3)
GFR calc Af Amer: 53 mL/min — ABNORMAL LOW (ref >60)
GFR calc non Af Amer: 46 mL/min — ABNORMAL LOW (ref >60)
GLUCOSE: 114 mg/dL — AB (ref 65–99)
POTASSIUM: 4.4 mmol/L (ref 3.5–5.1)
Sodium: 134 mmol/L — ABNORMAL LOW (ref 135–145)

## 2015-02-09 LAB — METHYLMALONIC ACID, SERUM: Methylmalonic Acid, Quantitative: 390 nmol/L — ABNORMAL HIGH (ref 0–378)

## 2015-02-09 MED ORDER — SULFAMETHOXAZOLE-TRIMETHOPRIM 800-160 MG PO TABS: 1.0000 | ORAL_TABLET | Freq: Two times a day (BID) | ORAL | Status: DC

## 2015-02-09 MED ORDER — GERHARDT'S BUTT CREAM: 1.0000 "application " | TOPICAL_CREAM | Freq: Three times a day (TID) | CUTANEOUS | Status: DC

## 2015-02-09 NOTE — Assessment & Plan Note (Signed)
Skin irritated in gluteal cleft since started wearing depends. No warmth, induration, or fluctuance. Suspect mild skin irritation from urine and feces. Will continue butt cream as barrier. Will have care giver monitor for worsening symptoms and irritation. Given return precautions.

## 2015-02-09 NOTE — ED Provider Notes (Signed)
[facility] Emergency Department Provider Note  ____________________________________________  Time seen: Approximately 4:48 PM  I have reviewed the triage vital signs and the nursing notes.   HISTORY  Chief Complaint Urinary Retention    HPI [name] is a 81 y.o. female with history of hypertension, hyperlipidemia, thrombocytopenia, history of second-degree heart block status post pacemaker who presents for evaluation of acute urinary retention. The patient was discharged from this hospital on 02/07/2015 after treatment for sepsis related to Escherichia coli urinary tract infection. During the hospitalization she had acute urinary retention with hydronephrosis and required Foley catheter which was removed 2 days ago. She appeared to be urinating well yesterday however today has had sensation of fullness in the lower abdomen and has been urinating frequently but in small amounts. No fevers or chills. No vomiting, diarrhea, chest pain or difficulty breathing. No recent falls. Current severity of symptoms is moderate. No modifying factors. She has otherwise been recovering well.    Past Medical History  Diagnosis Date  . HTN (hypertension)   . Syncope   . Hyperlipidemia   . Ischemic colitis   . Thrombocytopenia   . Mobitz type 2 second degree heart block   . Anemia   . Pacemaker     Patient Active Problem List   Diagnosis Date Noted  . Monocytosis 02/09/2015  . Thrombocytopenia 02/09/2015  . B12 deficiency 02/09/2015  . Urinary retention 02/09/2015  . Skin irritation of gluteal cleft 02/09/2015  . Bacteremia 01/12/2015  . UTI (lower urinary tract infection) 01/12/2015    Past Surgical History  Procedure Laterality Date  . Left hip replacement     . Cholecystectomy    . Pacemaker placement      Current Outpatient Rx  Name  Route  Sig  Dispense  Refill  . amLODipine (NORVASC) 5 MG tablet   Oral   Take 5 mg by mouth daily.          Marland Kitchen aspirin  EC 81 MG tablet   Oral   Take 81 mg by mouth daily.         . carvedilol (COREG) 12.5 MG tablet   Oral   Take 12.5 mg by mouth 2 (two) times daily.         . furosemide (LASIX) 20 MG tablet   Oral   Take 10 mg by mouth daily as needed for edema.          . hydrALAZINE (APRESOLINE) 10 MG tablet   Oral   Take 10 mg by mouth 2 (two) times daily.          Marland Kitchen HYDROcodone-acetaminophen (NORCO/VICODIN) 5-325 MG per tablet   Oral   Take 1 tablet by mouth 4 (four) times daily as needed for moderate pain.         Marland Kitchen Hydrocortisone (GERHARDT'S BUTT CREAM) CREA   Topical   Apply 1 application topically 3 (three) times daily.   1 each   2   . latanoprost (XALATAN) 0.005 % ophthalmic solution   Both Eyes   Place 1 drop into both eyes at bedtime.         Marland Kitchen losartan (COZAAR) 100 MG tablet   Oral   Take 50 mg by mouth 2 (two) times daily.         . Multiple Vitamin (MULTIVITAMIN WITH MINERALS) TABS tablet   Oral   Take 1 tablet by mouth daily.         . Multiple Vitamins-Minerals (PRESERVISION  AREDS 2) CAPS   Oral   Take 2 capsules by mouth daily.         . Omega-3 Fatty Acids (FISH OIL) 1000 MG CAPS   Oral   Take 2,000 mg by mouth daily.          Marland Kitchen sulfamethoxazole-trimethoprim (BACTRIM DS) 800-160 MG per tablet   Oral   Take 1 tablet by mouth 2 (two) times daily.   4 tablet   0     Allergies Atenolol; Penicillins; and Dilaudid  Family History  Problem Relation Age of Onset  . Stroke Mother   . Alcoholism Father   . Hypertension Sister     Social History Social History  Substance Use Topics  . Smoking status: Former Research scientist (life sciences)  . Smokeless tobacco: Never Used  . Alcohol Use: No    Review of Systems Constitutional: No fever/chills Eyes: No visual changes. ENT: No sore throat. Cardiovascular: Denies chest pain. Respiratory: Denies shortness of breath. Gastrointestinal: No abdominal pain.  No nausea, no vomiting.  No diarrhea.  No  constipation. Genitourinary: Negative for dysuria. Musculoskeletal: Negative for back pain. Skin: Negative for rash. Neurological: Negative for headaches, focal weakness or numbness.  10-point ROS otherwise negative.  ____________________________________________   PHYSICAL EXAM:  VITAL SIGNS: ED Triage Vitals  Enc Vitals Group     BP 02/09/15 1352 133/53 mmHg     Pulse Rate 02/09/15 1352 85     Resp 02/09/15 1352 18     Temp 02/09/15 1352 98.7 F (37.1 C)     Temp Source 02/09/15 1352 Oral     SpO2 02/09/15 1352 94 %     Weight 02/09/15 1352 119 lb (53.978 kg)     Height 02/09/15 1352 5\' 1"  (1.549 m)     Head Cir --      Peak Flow --      Pain Score 02/09/15 1353 0     Pain Loc --      Pain Edu? --      Excl. in [place]? --     Constitutional: Alert and oriented. Well appearing and in no acute distress. Eyes: Conjunctivae are normal. PERRL. EOMI. Head: Atraumatic. Nose: No congestion/rhinnorhea. Mouth/Throat: Mucous membranes are moist.  Oropharynx non-erythematous. Neck: No stridor.   Cardiovascular: Normal rate, regular rhythm. Grossly normal heart sounds.  Good peripheral circulation. Respiratory: Normal respiratory effort.  No retractions. Lungs CTAB. Gastrointestinal: Soft and nontender. Mild suprapubic fullness. No abdominal bruits. No CVA tenderness. Genitourinary: deferred Musculoskeletal: No lower extremity tenderness nor edema.  No joint effusions. Neurologic:  Normal speech and language. No gross focal neurologic deficits are appreciated. No gait instability. Skin:  Skin is warm, dry and intact. No rash noted. Psychiatric: Mood and affect are normal. Speech and behavior are normal.  ____________________________________________   LABS (all labs ordered are listed, but only abnormal results are displayed)  Labs Reviewed  URINALYSIS COMPLETEWITH MICROSCOPIC ([facility] ONLY) - Abnormal; Notable for the following:    Color, Urine STRAW (*)    APPearance CLEAR (*)     Specific Gravity, Urine 1.003 (*)    Leukocytes, UA 2+ (*)    Bacteria, UA RARE (*)    Squamous Epithelial / LPF 0-5 (*)    All other components within normal limits  BASIC METABOLIC PANEL - Abnormal; Notable for the following:    Sodium 134 (*)    Glucose, Bld 114 (*)    Creatinine, Ser 1.04 (*)    Calcium 8.8 (*)  GFR calc non Af Amer 46 (*)    GFR calc Af Amer 53 (*)    All other components within normal limits  CBC WITH DIFFERENTIAL/PLATELET - Abnormal; Notable for the following:    RBC 3.17 (*)    Hemoglobin 9.6 (*)    HCT 28.8 (*)    RDW 14.7 (*)    Platelets 90 (*)    Monocytes Absolute 2.0 (*)    All other components within normal limits   ____________________________________________  EKG  none ____________________________________________  RADIOLOGY  none ____________________________________________   PROCEDURES  Procedure(s) performed: None  Critical Care performed: No  ____________________________________________   INITIAL IMPRESSION / ASSESSMENT AND PLAN / ED COURSE  Pertinent labs & imaging results that were available during my care of the patient were reviewed by me and considered in my medical decision making (see chart for details).  [name] is a 81 y.o. female with history of hypertension, hyperlipidemia, thrombocytopenia, history of second-degree heart block status post pacemaker who presents for evaluation of acute urinary retention. On exam, she is very well-appearing and in no acute distress vital signs stable, she is afebrile. Bladder scan on arrival showed 800 mL in the bladder. Foley placed with immediate yield of copious yellow urine. Patient with immediate relief of symptoms. We'll check basic labs, urinalysis.  ----------------------------------------- 8:11 PM on 02/09/2015 ----------------------------------------- Labs notable for very mild creatinine elevation at 1.04, baseline is 0.8. Mild stable anemia with hemoglobin 9.6.  Platelets improved to 90,000, compared to 43,002 days ago. Urinalysis appears chronically inflamed but nitrite negative, no white blood cells at this time which is an improvement from prior. We'll send culture. Will not treat for urinary tract infection at this time as she was discharged from the hospital with a perscription for Bactrim. Discussed case with Dr. Louis Meckel of urology who will help to arrange expedient follow-up, reports patient is low risk for postobstructive diuresis. I discussed plan of care with patient and her family as well as plans for follow-up and they're comfortable with the discharge plan.  ____________________________________________   FINAL CLINICAL IMPRESSION(S) / ED DIAGNOSES  Final diagnoses:  Acute urinary retention      Joanne Gavel, MD 02/09/15 2014

## 2015-02-09 NOTE — Progress Notes (Addendum)
Patient ID: [name], female   DOB: March 05, 1923, 81 y.o.   MRN: [id number]  Tommi Rumps, MD Phone: [phone]  [name] is a 81 y.o. female who presents today for new patient visit. Hospital follow-up  Patient reports she has done well since discharge from the hospital. She was hospitalized for sepsis related to E coli UTI. She was found to have obstruction with enlarged bladder and hydronephrosis. Since discharge she has developed urinary frequency with urination every hour. She denies fever, chills, dysuria, and abdominal pain. Notes an abdominal fullness. Feels bladder is not emptying fully some of the time. Had foley in the hospital, though this was not continued on discharge.  She was also found to have thrombocytopenia with a low count of 6,000. She received 2 unites of platelets and her platelet count increased to 46,000 prior to discharge. There was some concern for myelodysplastic issue and she is to follow-up with hematology. No history of bleeding. No family history of blood disorders. Notes previously had been low in [address].   Needs refill on butt cream. Notes that she has been in depends and this has lead to irritation of her gluteal cleft. There has been a mild irritation to the area, though no ulceration. Notes the cream helps with this.   Active Ambulatory Problems    Diagnosis Date Noted  . Bacteremia 01/12/2015  . UTI (lower urinary tract infection) 01/12/2015  . Monocytosis 02/09/2015  . Thrombocytopenia 02/09/2015  . B12 deficiency 02/09/2015  . Urinary retention 02/09/2015  . Skin irritation of gluteal cleft 02/09/2015   Resolved Ambulatory Problems    Diagnosis Date Noted  . Sepsis 01/10/2015   Past Medical History  Diagnosis Date  . HTN (hypertension)   . Syncope   . Hyperlipidemia   . Ischemic colitis   . Mobitz type 2 second degree heart block   . Anemia   . Pacemaker     Family History  Problem Relation Age of Onset  . Stroke Mother   .  Alcoholism Father   . Hypertension Sister     Social History   Social History  . Marital Status: Widowed    Spouse Name: N/A  . Number of Children: N/A  . Years of Education: N/A   Occupational History  . Not on file.   Social History Main Topics  . Smoking status: Former Research scientist (life sciences)  . Smokeless tobacco: Never Used  . Alcohol Use: No  . Drug Use: No  . Sexual Activity: Not on file   Other Topics Concern  . Not on file   Social History Narrative    ROS   General:  Negative for nexplained weight loss, positive for fever prior to recent hospitalizatin Skin: Negative for new or changing mole, sore that won't heal HEENT: Positive for trouble hearing (long term issue, wears hearing aids), Negative for trouble seeing, ringing in ears, mouth sores, hoarseness, change in voice, dysphagia. CV:  Negative for chest pain, dyspnea, edema, palpitations Resp: Negative for cough, dyspnea, hemoptysis GI: Negative for nausea, vomiting, diarrhea, constipation, abdominal pain, melena, hematochezia. GU: positive for frequent urination, Negative for dysuria, incontinence, urinary hesitance, hematuria, vaginal or penile discharge, sexual difficulty, lumps in testicle or breasts MSK: Negative for muscle cramps or aches, joint pain or swelling Neuro: Negative for headaches, weakness, numbness, dizziness, passing out/fainting Psych: Positive for anxiety while hospitalized, Negative for depression, memory problems  Objective  Physical Exam Filed Vitals:   02/09/15 1059  BP: 124/72  Pulse: 78  Temp:  99.1 F (37.3 C)    Physical Exam  Constitutional: No distress.  HENT:  Head: Normocephalic and atraumatic.  Right Ear: External ear normal.  Left Ear: External ear normal.  Mouth/Throat: Oropharynx is clear and moist.  Bilateral TMs normal  Eyes: Conjunctivae are normal. Pupils are equal, round, and reactive to light.  Cardiovascular: Normal rate, regular rhythm and normal heart sounds.  Exam  reveals no gallop and no friction rub.   No murmur heard. Pulmonary/Chest: Effort normal. No respiratory distress. She has no wheezes. She has no rales.  Abdominal: Soft. Bowel sounds are normal. There is no tenderness. There is no rebound and no guarding.  Bladder distended with dome palpable at umbilicus with no tenderness  Musculoskeletal: She exhibits no edema.  Lymphadenopathy:    She has no cervical adenopathy.  Neurological: She is alert.  Skin: Skin is warm and dry. She is not diaphoretic.  Psychiatric: Mood and affect normal.     Assessment/Plan:   Thrombocytopenia Found on recent hospitalization. Has follow-up with hematology for further work up and management. Will defer CBC to ED at this time.    UTI (lower urinary tract infection) Improved symptoms. Will continue bactrim and add 2 additional days of treatment to complete the 14 day course from 02/02/15 (the date the foley was placed) per ID's recommendations when patient was in the hospital. Is well appearing at this time. Repeat blood cultures negative. Will continue to monitor. Given return precautions.   Urinary retention Patient found to have bladder distension and hydronephrosis in the hospital. Was not discharged with foley and has had increased urinary frequency since discharge. Now with palpable distended bladder. No abdominal pain. Will refer to urology. Unable to get patient in to urology today, thus will send patient to ED for her urinary retention. Patient is with stable vital signs and no abdominal pain with this issue and is stable for transport by private vehicle. CMA called charge nurse to inform the patient she was on her way to the ED. Would benefit from BMET to check renal function and possibly from renal US to evaluate retention and for hydronephrosis.   Skin irritation of gluteal cleft Skin irritated in gluteal cleft since started wearing depends. No warmth, induration, or fluctuance. Suspect mild skin  irritation from urine and feces. Will continue butt cream as barrier. Will have care giver monitor for worsening symptoms and irritation. Given return precautions.     Orders Placed This Encounter  Procedures  . Ambulatory referral to Urology    Referral Priority:  Routine    Referral Type:  Consultation    Referral Reason:  Specialty Services Required    Requested Specialty:  Urology    Number of Visits Requested:  1    Meds ordered this encounter  Medications  . sulfamethoxazole-trimethoprim (BACTRIM DS) 800-160 MG per tablet    Sig: Take 1 tablet by mouth 2 (two) times daily.    Dispense:  4 tablet    Refill:  0  . Hydrocortisone (GERHARDT'S BUTT CREAM) CREA    Sig: Apply 1 application topically 3 (three) times daily.    Dispense:  1 each    Refill:  2    Tommi Rumps

## 2015-02-09 NOTE — ED Notes (Addendum)
Pt to ed with c/o urinary retention today.  Per family and caregivers pt goes to the bathroom every hour and does void but today at [facility] office was sent here due to bloating and distention of abd. PMD Dr. Biagio Quint.

## 2015-02-09 NOTE — ED Notes (Signed)
Bladder scan done prior to urinating 775, pt voided in bed pan 550 remained after urinating. MD aware. Foley placed to gravity

## 2015-02-09 NOTE — Assessment & Plan Note (Signed)
Improved symptoms. Will continue bactrim and add 2 additional days of treatment to complete the 14 day course from 02/02/15 (the date the foley was placed) per ID's recommendations when patient was in the hospital. Is well appearing at this time. Repeat blood cultures negative. Will continue to monitor. Given return precautions.

## 2015-02-09 NOTE — ED Notes (Signed)
Pt to ed with c/o frequent urination, family member states last night she was urinating every hour was recently d/c'd from the hospital and had a foley cath in place and states since it was removed she has had difficulty urinating all urine. Pt denies any pain at this time, A&O per norm for pt

## 2015-02-09 NOTE — Addendum Note (Signed)
Addended by: Leone Haven on: 02/09/2015 01:33 PM   Modules accepted: Orders

## 2015-02-09 NOTE — Assessment & Plan Note (Addendum)
Patient found to have bladder distension and hydronephrosis in the hospital. Was not discharged with foley and has had increased urinary frequency since discharge. Now with palpable distended bladder. No abdominal pain. Will refer to urology. Unable to get patient in to urology today, thus will send patient to ED for her urinary retention. Patient is with stable vital signs and no abdominal pain with this issue and is stable for transport by private vehicle. CMA called charge nurse to inform the patient she was on her way to the ED. Would benefit from BMET to check renal function and possibly from renal US to evaluate retention and for hydronephrosis.

## 2015-02-09 NOTE — Progress Notes (Signed)
Pre visit review using our clinic review tool, if applicable. No additional management support is needed unless otherwise documented below in the visit note. 

## 2015-02-09 NOTE — Assessment & Plan Note (Addendum)
Found on recent hospitalization. Has follow-up with hematology for further work up and management. Will defer CBC to ED at this time.

## 2015-02-09 NOTE — Patient Instructions (Signed)
Nice to meet you. You appear to have urinary retention. You need evaluation for this immediately. Please go to the ED for evaluation.  We will attempt to get you in to see the urologist for this issue as soon as possible.

## 2015-02-10 ENCOUNTER — Inpatient Hospital Stay: Payer: Medicare Other | Attending: Hematology and Oncology | Admitting: Hematology and Oncology

## 2015-02-10 ENCOUNTER — Inpatient Hospital Stay: Payer: Medicare Other

## 2015-02-10 VITALS — BP 130/73 | HR 83 | Temp 98.2°F | Wt 119.0 lb

## 2015-02-10 DIAGNOSIS — I10 Essential (primary) hypertension: Secondary | ICD-10-CM | POA: Insufficient documentation

## 2015-02-10 DIAGNOSIS — D72821 Monocytosis (symptomatic): Secondary | ICD-10-CM | POA: Diagnosis not present

## 2015-02-10 DIAGNOSIS — D696 Thrombocytopenia, unspecified: Secondary | ICD-10-CM | POA: Diagnosis not present

## 2015-02-10 DIAGNOSIS — E538 Deficiency of other specified B group vitamins: Secondary | ICD-10-CM | POA: Insufficient documentation

## 2015-02-10 DIAGNOSIS — Z79899 Other long term (current) drug therapy: Secondary | ICD-10-CM | POA: Insufficient documentation

## 2015-02-10 DIAGNOSIS — I4891 Unspecified atrial fibrillation: Secondary | ICD-10-CM | POA: Diagnosis not present

## 2015-02-10 MED ORDER — CYANOCOBALAMIN 1000 MCG/ML IJ SOLN
1000.0000 ug | Freq: Once | INTRAMUSCULAR | Status: AC
  Administered 2015-02-10: 1000 ug via INTRAMUSCULAR
  Filled 2015-02-10: qty 1

## 2015-02-10 NOTE — Progress Notes (Signed)
Patient seen in ED last night due too being unable to empty her bladder.  Foley placed.  No other complaints. Patient has not have a BM since Tuesday. Does not take stool softeners on regular basis.  Patient has 24/7 care. Lives with her daughter.

## 2015-02-11 ENCOUNTER — Telehealth: Payer: Self-pay | Admitting: Internal Medicine

## 2015-02-11 DIAGNOSIS — Z950 Presence of cardiac pacemaker: Secondary | ICD-10-CM | POA: Diagnosis not present

## 2015-02-11 DIAGNOSIS — E785 Hyperlipidemia, unspecified: Secondary | ICD-10-CM | POA: Diagnosis not present

## 2015-02-11 DIAGNOSIS — M8448XD Pathological fracture, other site, subsequent encounter for fracture with routine healing: Secondary | ICD-10-CM | POA: Diagnosis not present

## 2015-02-11 DIAGNOSIS — D649 Anemia, unspecified: Secondary | ICD-10-CM | POA: Diagnosis not present

## 2015-02-11 DIAGNOSIS — B9620 Unspecified Escherichia coli [E. coli] as the cause of diseases classified elsewhere: Secondary | ICD-10-CM | POA: Diagnosis not present

## 2015-02-11 DIAGNOSIS — N390 Urinary tract infection, site not specified: Secondary | ICD-10-CM | POA: Diagnosis not present

## 2015-02-11 DIAGNOSIS — L89322 Pressure ulcer of left buttock, stage 2: Secondary | ICD-10-CM | POA: Diagnosis not present

## 2015-02-11 DIAGNOSIS — I10 Essential (primary) hypertension: Secondary | ICD-10-CM | POA: Diagnosis not present

## 2015-02-11 NOTE — Telephone Encounter (Signed)
PC from [place] at [facility]  Patient sent home from ER with Foley--but only leg bag Having some leaking She thinks it is positional Requests order to change to larger bag for evenings Order given--she will instruct patient on its use

## 2015-02-12 ENCOUNTER — Telehealth: Payer: Self-pay | Admitting: Internal Medicine

## 2015-02-12 LAB — URINE CULTURE: CULTURE: NO GROWTH

## 2015-02-12 MED ORDER — HYDROCORTISONE 2.5 % EX CREA: TOPICAL_CREAM | Freq: Three times a day (TID) | CUTANEOUS | Status: DC | PRN

## 2015-02-12 NOTE — Telephone Encounter (Signed)
Call from [place] at [facility] in [place] They don't have that special compounded cream for her buttock irritation I asked him to give her 2.5% hydrocortisone cream--then cover that with generous amount of zinc oxide for protection

## 2015-02-13 DIAGNOSIS — M8448XD Pathological fracture, other site, subsequent encounter for fracture with routine healing: Secondary | ICD-10-CM | POA: Diagnosis not present

## 2015-02-13 DIAGNOSIS — B9620 Unspecified Escherichia coli [E. coli] as the cause of diseases classified elsewhere: Secondary | ICD-10-CM | POA: Diagnosis not present

## 2015-02-13 DIAGNOSIS — D649 Anemia, unspecified: Secondary | ICD-10-CM | POA: Diagnosis not present

## 2015-02-13 DIAGNOSIS — L89322 Pressure ulcer of left buttock, stage 2: Secondary | ICD-10-CM | POA: Diagnosis not present

## 2015-02-13 DIAGNOSIS — N390 Urinary tract infection, site not specified: Secondary | ICD-10-CM | POA: Diagnosis not present

## 2015-02-13 DIAGNOSIS — I10 Essential (primary) hypertension: Secondary | ICD-10-CM | POA: Diagnosis not present

## 2015-02-14 ENCOUNTER — Telehealth: Payer: Self-pay | Admitting: *Deleted

## 2015-02-14 DIAGNOSIS — M8448XD Pathological fracture, other site, subsequent encounter for fracture with routine healing: Secondary | ICD-10-CM | POA: Diagnosis not present

## 2015-02-14 DIAGNOSIS — N390 Urinary tract infection, site not specified: Secondary | ICD-10-CM | POA: Diagnosis not present

## 2015-02-14 DIAGNOSIS — I10 Essential (primary) hypertension: Secondary | ICD-10-CM | POA: Diagnosis not present

## 2015-02-14 DIAGNOSIS — L89322 Pressure ulcer of left buttock, stage 2: Secondary | ICD-10-CM | POA: Diagnosis not present

## 2015-02-14 DIAGNOSIS — B9620 Unspecified Escherichia coli [E. coli] as the cause of diseases classified elsewhere: Secondary | ICD-10-CM | POA: Diagnosis not present

## 2015-02-14 DIAGNOSIS — D649 Anemia, unspecified: Secondary | ICD-10-CM | POA: Diagnosis not present

## 2015-02-14 NOTE — Telephone Encounter (Signed)
Debbie from [facility] called states pt has been constipated for a week, started Mirilax on yesterday.  Also states pts daughter stopped her Amlodipine due to pts BP being 90/40 per daughter.  Debbie said today her BP was 120/68.    pts daughter is requesting to add a stool softener .  Please advise

## 2015-02-14 NOTE — Telephone Encounter (Signed)
I would advise allowing time for the miralax to work. If she has not had a BM in the next 2 days please let us know. If the patient has developed abdominal pain, nausea, vomiting, or diarrhea she should be evaluated. I agree with stopping the amlodipine given her blood pressure. I am happy that her blood pressure improved. They should check her blood pressure daily to give [place] a trend in BPs since coming off the amlodipine. Thanks.

## 2015-02-15 ENCOUNTER — Ambulatory Visit: Payer: Medicare Other | Admitting: Family Medicine

## 2015-02-15 NOTE — Telephone Encounter (Signed)
Spoke with Jackelyn Poling advised of MDs message.  Verbalized understanding

## 2015-02-16 DIAGNOSIS — B9620 Unspecified Escherichia coli [E. coli] as the cause of diseases classified elsewhere: Secondary | ICD-10-CM | POA: Diagnosis not present

## 2015-02-16 DIAGNOSIS — I10 Essential (primary) hypertension: Secondary | ICD-10-CM | POA: Diagnosis not present

## 2015-02-16 DIAGNOSIS — L89322 Pressure ulcer of left buttock, stage 2: Secondary | ICD-10-CM | POA: Diagnosis not present

## 2015-02-16 DIAGNOSIS — M8448XD Pathological fracture, other site, subsequent encounter for fracture with routine healing: Secondary | ICD-10-CM | POA: Diagnosis not present

## 2015-02-16 DIAGNOSIS — D649 Anemia, unspecified: Secondary | ICD-10-CM | POA: Diagnosis not present

## 2015-02-16 DIAGNOSIS — N390 Urinary tract infection, site not specified: Secondary | ICD-10-CM | POA: Diagnosis not present

## 2015-02-17 ENCOUNTER — Inpatient Hospital Stay: Payer: Medicare Other

## 2015-02-17 DIAGNOSIS — D72821 Monocytosis (symptomatic): Secondary | ICD-10-CM | POA: Diagnosis not present

## 2015-02-17 DIAGNOSIS — E538 Deficiency of other specified B group vitamins: Secondary | ICD-10-CM

## 2015-02-17 MED ORDER — CYANOCOBALAMIN 1000 MCG/ML IJ SOLN
1000.0000 ug | Freq: Once | INTRAMUSCULAR | Status: AC
  Administered 2015-02-17: 1000 ug via INTRAMUSCULAR
  Filled 2015-02-17: qty 1

## 2015-02-18 DIAGNOSIS — D649 Anemia, unspecified: Secondary | ICD-10-CM | POA: Diagnosis not present

## 2015-02-18 DIAGNOSIS — L89322 Pressure ulcer of left buttock, stage 2: Secondary | ICD-10-CM | POA: Diagnosis not present

## 2015-02-18 DIAGNOSIS — B9620 Unspecified Escherichia coli [E. coli] as the cause of diseases classified elsewhere: Secondary | ICD-10-CM | POA: Diagnosis not present

## 2015-02-18 DIAGNOSIS — N390 Urinary tract infection, site not specified: Secondary | ICD-10-CM | POA: Diagnosis not present

## 2015-02-18 DIAGNOSIS — M8448XD Pathological fracture, other site, subsequent encounter for fracture with routine healing: Secondary | ICD-10-CM | POA: Diagnosis not present

## 2015-02-18 DIAGNOSIS — I10 Essential (primary) hypertension: Secondary | ICD-10-CM | POA: Diagnosis not present

## 2015-02-19 ENCOUNTER — Encounter: Payer: Self-pay | Admitting: Hematology and Oncology

## 2015-02-20 DIAGNOSIS — N390 Urinary tract infection, site not specified: Secondary | ICD-10-CM | POA: Diagnosis not present

## 2015-02-20 DIAGNOSIS — L89322 Pressure ulcer of left buttock, stage 2: Secondary | ICD-10-CM | POA: Diagnosis not present

## 2015-02-20 DIAGNOSIS — I10 Essential (primary) hypertension: Secondary | ICD-10-CM | POA: Diagnosis not present

## 2015-02-20 DIAGNOSIS — M8448XD Pathological fracture, other site, subsequent encounter for fracture with routine healing: Secondary | ICD-10-CM | POA: Diagnosis not present

## 2015-02-20 DIAGNOSIS — B9620 Unspecified Escherichia coli [E. coli] as the cause of diseases classified elsewhere: Secondary | ICD-10-CM | POA: Diagnosis not present

## 2015-02-20 DIAGNOSIS — D649 Anemia, unspecified: Secondary | ICD-10-CM | POA: Diagnosis not present

## 2015-02-21 DIAGNOSIS — L89322 Pressure ulcer of left buttock, stage 2: Secondary | ICD-10-CM | POA: Diagnosis not present

## 2015-02-21 DIAGNOSIS — N390 Urinary tract infection, site not specified: Secondary | ICD-10-CM | POA: Diagnosis not present

## 2015-02-21 DIAGNOSIS — I10 Essential (primary) hypertension: Secondary | ICD-10-CM | POA: Diagnosis not present

## 2015-02-21 DIAGNOSIS — B9620 Unspecified Escherichia coli [E. coli] as the cause of diseases classified elsewhere: Secondary | ICD-10-CM | POA: Diagnosis not present

## 2015-02-21 DIAGNOSIS — M8448XD Pathological fracture, other site, subsequent encounter for fracture with routine healing: Secondary | ICD-10-CM | POA: Diagnosis not present

## 2015-02-21 DIAGNOSIS — D649 Anemia, unspecified: Secondary | ICD-10-CM | POA: Diagnosis not present

## 2015-02-22 DIAGNOSIS — I10 Essential (primary) hypertension: Secondary | ICD-10-CM | POA: Diagnosis not present

## 2015-02-22 DIAGNOSIS — L89322 Pressure ulcer of left buttock, stage 2: Secondary | ICD-10-CM | POA: Diagnosis not present

## 2015-02-22 DIAGNOSIS — N390 Urinary tract infection, site not specified: Secondary | ICD-10-CM | POA: Diagnosis not present

## 2015-02-22 DIAGNOSIS — M8448XD Pathological fracture, other site, subsequent encounter for fracture with routine healing: Secondary | ICD-10-CM | POA: Diagnosis not present

## 2015-02-22 DIAGNOSIS — B9620 Unspecified Escherichia coli [E. coli] as the cause of diseases classified elsewhere: Secondary | ICD-10-CM | POA: Diagnosis not present

## 2015-02-22 DIAGNOSIS — D649 Anemia, unspecified: Secondary | ICD-10-CM | POA: Diagnosis not present

## 2015-02-23 ENCOUNTER — Ambulatory Visit: Payer: Medicare Other | Admitting: Primary Care

## 2015-02-23 DIAGNOSIS — N390 Urinary tract infection, site not specified: Secondary | ICD-10-CM | POA: Diagnosis not present

## 2015-02-23 DIAGNOSIS — L89322 Pressure ulcer of left buttock, stage 2: Secondary | ICD-10-CM | POA: Diagnosis not present

## 2015-02-23 DIAGNOSIS — B9620 Unspecified Escherichia coli [E. coli] as the cause of diseases classified elsewhere: Secondary | ICD-10-CM | POA: Diagnosis not present

## 2015-02-23 DIAGNOSIS — D649 Anemia, unspecified: Secondary | ICD-10-CM | POA: Diagnosis not present

## 2015-02-23 DIAGNOSIS — I10 Essential (primary) hypertension: Secondary | ICD-10-CM | POA: Diagnosis not present

## 2015-02-23 DIAGNOSIS — M8448XD Pathological fracture, other site, subsequent encounter for fracture with routine healing: Secondary | ICD-10-CM | POA: Diagnosis not present

## 2015-02-24 ENCOUNTER — Inpatient Hospital Stay: Payer: Medicare Other

## 2015-02-24 DIAGNOSIS — E538 Deficiency of other specified B group vitamins: Secondary | ICD-10-CM

## 2015-02-24 DIAGNOSIS — D72821 Monocytosis (symptomatic): Secondary | ICD-10-CM | POA: Diagnosis not present

## 2015-02-24 MED ORDER — CYANOCOBALAMIN 1000 MCG/ML IJ SOLN
1000.0000 ug | Freq: Once | INTRAMUSCULAR | Status: AC
  Administered 2015-02-24: 1000 ug via INTRAMUSCULAR
  Filled 2015-02-24: qty 1

## 2015-02-27 DIAGNOSIS — N390 Urinary tract infection, site not specified: Secondary | ICD-10-CM | POA: Diagnosis not present

## 2015-02-27 DIAGNOSIS — I10 Essential (primary) hypertension: Secondary | ICD-10-CM | POA: Diagnosis not present

## 2015-02-27 DIAGNOSIS — M8448XD Pathological fracture, other site, subsequent encounter for fracture with routine healing: Secondary | ICD-10-CM | POA: Diagnosis not present

## 2015-02-27 DIAGNOSIS — L89322 Pressure ulcer of left buttock, stage 2: Secondary | ICD-10-CM | POA: Diagnosis not present

## 2015-02-27 DIAGNOSIS — D649 Anemia, unspecified: Secondary | ICD-10-CM | POA: Diagnosis not present

## 2015-02-27 DIAGNOSIS — B9620 Unspecified Escherichia coli [E. coli] as the cause of diseases classified elsewhere: Secondary | ICD-10-CM | POA: Diagnosis not present

## 2015-02-28 DIAGNOSIS — I10 Essential (primary) hypertension: Secondary | ICD-10-CM | POA: Diagnosis not present

## 2015-02-28 DIAGNOSIS — B9620 Unspecified Escherichia coli [E. coli] as the cause of diseases classified elsewhere: Secondary | ICD-10-CM | POA: Diagnosis not present

## 2015-02-28 DIAGNOSIS — D649 Anemia, unspecified: Secondary | ICD-10-CM | POA: Diagnosis not present

## 2015-02-28 DIAGNOSIS — N390 Urinary tract infection, site not specified: Secondary | ICD-10-CM | POA: Diagnosis not present

## 2015-02-28 DIAGNOSIS — L89322 Pressure ulcer of left buttock, stage 2: Secondary | ICD-10-CM | POA: Diagnosis not present

## 2015-02-28 DIAGNOSIS — M8448XD Pathological fracture, other site, subsequent encounter for fracture with routine healing: Secondary | ICD-10-CM | POA: Diagnosis not present

## 2015-03-01 ENCOUNTER — Telehealth: Payer: Self-pay | Admitting: Surgical

## 2015-03-01 ENCOUNTER — Encounter: Payer: Self-pay | Admitting: Family Medicine

## 2015-03-01 ENCOUNTER — Ambulatory Visit (INDEPENDENT_AMBULATORY_CARE_PROVIDER_SITE_OTHER): Payer: Medicare Other | Admitting: Family Medicine

## 2015-03-01 VITALS — BP 126/84 | HR 78 | Temp 98.6°F | Ht 61.0 in | Wt 113.0 lb

## 2015-03-01 DIAGNOSIS — B372 Candidiasis of skin and nail: Secondary | ICD-10-CM | POA: Diagnosis not present

## 2015-03-01 DIAGNOSIS — I10 Essential (primary) hypertension: Secondary | ICD-10-CM

## 2015-03-01 DIAGNOSIS — L8992 Pressure ulcer of unspecified site, stage 2: Secondary | ICD-10-CM

## 2015-03-01 DIAGNOSIS — R339 Retention of urine, unspecified: Secondary | ICD-10-CM | POA: Diagnosis not present

## 2015-03-01 DIAGNOSIS — L89152 Pressure ulcer of sacral region, stage 2: Secondary | ICD-10-CM

## 2015-03-01 HISTORY — DX: Essential (primary) hypertension: I10

## 2015-03-01 LAB — BASIC METABOLIC PANEL
BUN: 7 mg/dL (ref 6–23)
CO2: 26 mEq/L (ref 19–32)
CREATININE: 0.87 mg/dL (ref 0.40–1.20)
Calcium: 9.6 mg/dL (ref 8.4–10.5)
Chloride: 101 mEq/L (ref 96–112)
GFR: 64.92 mL/min (ref >60.00)
Glucose, Bld: 85 mg/dL (ref 70–99)
Potassium: 5.1 mEq/L (ref 3.5–5.1)
Sodium: 133 mEq/L — ABNORMAL LOW (ref 135–145)

## 2015-03-01 MED ORDER — NYSTATIN 100000 UNIT/GM EX CREA: 1.0000 "application " | TOPICAL_CREAM | Freq: Two times a day (BID) | CUTANEOUS | Status: DC

## 2015-03-01 NOTE — Assessment & Plan Note (Signed)
Rash consistent with candidal intertrigo. Treat with nystatin. Given return precautions.

## 2015-03-01 NOTE — Assessment & Plan Note (Signed)
Improved with catheter in place. Benign abdominal exam. Urology f/u on Friday. Will check BMET given Cr mildly elevated in ED. Given return precautions.

## 2015-03-01 NOTE — Progress Notes (Signed)
Pre visit review using our clinic review tool, if applicable. No additional management support is needed unless otherwise documented below in the visit note. 

## 2015-03-01 NOTE — Assessment & Plan Note (Addendum)
Patient now with ulceration through dermis in left coccygeal region. No signs of infection. Discussed barrier protection and alternating pressure points. Will have [place] wound nursing evaluate this and refer to wound center for evaluation. Given return precautions.

## 2015-03-01 NOTE — Patient Instructions (Signed)
Nice to see you. We will have wound care evaluate the lesion on your buttock and have nursing come to the house to evaluate this as well.  Please continue to apply the barrier cream.  We will treat you with nystatin for the diaper rash.  Please continue to monitor your blood pressure.

## 2015-03-01 NOTE — Telephone Encounter (Signed)
Noted. CMA reported that the daughter was concerned for UTI given dark color to urine (though urine was normal yellow at visit today) and smell to urine. No other symptoms reported. Verbal orders given.

## 2015-03-01 NOTE — Telephone Encounter (Signed)
[facility] home health nurse for [name] called because daughter is concerned that her mother has a UTI and wanted verbal orders to collect a urine sample and CX tomorrow. She also wanted verbal orders to treat wound on her bottom until orders come in from the wound center. Per Dr. Caryl Bis gave those orders.

## 2015-03-01 NOTE — Assessment & Plan Note (Addendum)
Well controlled. BP improved after stopping amlodipine. Asymptomatic. Will continue current regimen. BMET today.

## 2015-03-01 NOTE — Progress Notes (Signed)
Patient ID: [name], female   DOB: Jun 02, 1923, 81 y.o.   MRN: [id number]  Tommi Rumps, MD Phone: [phone]  [name] is a 81 y.o. female who presents today for f/u.  Last visit patient was noted to be in acute urinary retention. Seen in ED for this and had cath placed. Urine culture negative. Cr mildly up from base line at that time. Urology f/u set up by ED and patient to be seen this Friday. Notes she is much improved and feels well. No abdominal pain or urinary complaints. Normal BMs. Notes her catheter is irritating her inner thighs. Has been present for the past week. There is a small red rash in this area that has not improved with barrier cream. Notes the area on her buttocks is still irritated. No fevers. Has been using barrier cream on buttock and in thighs.   HYPERTENSION Disease Monitoring Home BP Monitoring 125-140/60-82 Chest pain- no    Dyspnea- no Medications Compliance-  Taking meds, though has stopped amlodipine. Lightheadedness-  no  Edema- no   PMH: former smoker.   ROS see HPI  Objective  Physical Exam Filed Vitals:   03/01/15 1017  BP: 126/84  Pulse: 78  Temp: 98.6 F (37 C)    Physical Exam  Constitutional: She is well-developed, well-nourished, and in no distress.  HENT:  Head: Normocephalic and atraumatic.  Cardiovascular: Normal rate, regular rhythm and normal heart sounds.  Exam reveals no gallop and no friction rub.   No murmur heard. Pulmonary/Chest: Effort normal and breath sounds normal. No respiratory distress. She has no wheezes. She has no rales.  Abdominal: Soft. Bowel sounds are normal. She exhibits no distension. There is no tenderness. There is no rebound and no guarding.  Skin: Skin is warm and dry. She is not diaphoretic.     Inguinal area bilaterally with mild erythematous rash with satellite lesions noted, no vulvar lesions noted     Assessment/Plan: Please see individual problem list.  Urinary retention Improved  with catheter in place. Benign abdominal exam. Urology f/u on Friday. Will check BMET given Cr mildly elevated in ED. Given return precautions.   Pressure ulcer of left coccygeal region, stage 2 Patient now with ulceration through dermis in left coccygeal region. No signs of infection. Discussed barrier protection and alternating pressure points. Will have [facility] wound nursing evaluate this and refer to wound center for evaluation. Given return precautions.   Candidal intertrigo, inguinal Rash consistent with candidal intertrigo. Treat with nystatin. Given return precautions.   Essential hypertension Well controlled. BP improved after stopping amlodipine. Asymptomatic. Will continue current regimen. BMET today.     Orders Placed This Encounter  Procedures  . Basic Metabolic Panel (BMET)  . Ambulatory referral to Home Health    Referral Priority:  Routine    Referral Type:  Home Health Care    Referral Reason:  Specialty Services Required    Requested Specialty:  [place]    Number of Visits Requested:  1  . AMB referral to wound care center    Referral Priority:  Routine    Referral Type:  Consultation    Number of Visits Requested:  1    Meds ordered this encounter  Medications  . nystatin cream (MYCOSTATIN)    Sig: Apply 1 application topically 2 (two) times daily.    Dispense:  30 g    Refill:  0    Tommi Rumps

## 2015-03-02 ENCOUNTER — Ambulatory Visit: Payer: Medicare Other | Admitting: Family Medicine

## 2015-03-02 ENCOUNTER — Telehealth: Payer: Self-pay | Admitting: Family Medicine

## 2015-03-02 DIAGNOSIS — I10 Essential (primary) hypertension: Secondary | ICD-10-CM | POA: Diagnosis not present

## 2015-03-02 DIAGNOSIS — N390 Urinary tract infection, site not specified: Secondary | ICD-10-CM | POA: Diagnosis not present

## 2015-03-02 DIAGNOSIS — M8448XD Pathological fracture, other site, subsequent encounter for fracture with routine healing: Secondary | ICD-10-CM | POA: Diagnosis not present

## 2015-03-02 DIAGNOSIS — L89322 Pressure ulcer of left buttock, stage 2: Secondary | ICD-10-CM | POA: Diagnosis not present

## 2015-03-02 DIAGNOSIS — D649 Anemia, unspecified: Secondary | ICD-10-CM | POA: Diagnosis not present

## 2015-03-02 DIAGNOSIS — B9620 Unspecified Escherichia coli [E. coli] as the cause of diseases classified elsewhere: Secondary | ICD-10-CM | POA: Diagnosis not present

## 2015-03-02 NOTE — Telephone Encounter (Signed)
Please advise 

## 2015-03-02 NOTE — Telephone Encounter (Signed)
Patient can wait to see urology for this unless she has developed new symptoms since seeing me yesterday when there was no mention of urinary issues. If she has developed fever, abdominal discomfort, or the urge to urinate she should be seen prior to tomorrow for urine evaluation. If this is just due to the color of her urine being dark and the urine having a different odor to it then she can wait until seeing urology. Thanks.

## 2015-03-02 NOTE — Telephone Encounter (Signed)
Jeani Hawking called from [facility] regarding pt not needing the UA/CF and that it is not needed at this time. Pt is going to see a Marine scientist. Any questions please call [phone] Thank You!

## 2015-03-03 ENCOUNTER — Encounter: Payer: Self-pay | Admitting: Urology

## 2015-03-03 ENCOUNTER — Inpatient Hospital Stay: Payer: Medicare Other

## 2015-03-03 ENCOUNTER — Ambulatory Visit (INDEPENDENT_AMBULATORY_CARE_PROVIDER_SITE_OTHER): Payer: Medicare Other | Admitting: Urology

## 2015-03-03 ENCOUNTER — Other Ambulatory Visit: Payer: Self-pay | Admitting: Family Medicine

## 2015-03-03 VITALS — BP 162/70 | HR 82 | Ht 60.0 in | Wt 113.7 lb

## 2015-03-03 DIAGNOSIS — E785 Hyperlipidemia, unspecified: Secondary | ICD-10-CM | POA: Insufficient documentation

## 2015-03-03 DIAGNOSIS — R339 Retention of urine, unspecified: Secondary | ICD-10-CM

## 2015-03-03 DIAGNOSIS — N390 Urinary tract infection, site not specified: Secondary | ICD-10-CM

## 2015-03-03 DIAGNOSIS — E871 Hypo-osmolality and hyponatremia: Secondary | ICD-10-CM

## 2015-03-03 DIAGNOSIS — I10 Essential (primary) hypertension: Secondary | ICD-10-CM

## 2015-03-03 DIAGNOSIS — K559 Vascular disorder of intestine, unspecified: Secondary | ICD-10-CM | POA: Insufficient documentation

## 2015-03-03 DIAGNOSIS — Z950 Presence of cardiac pacemaker: Secondary | ICD-10-CM | POA: Insufficient documentation

## 2015-03-03 HISTORY — DX: Essential (primary) hypertension: I10

## 2015-03-03 NOTE — Progress Notes (Signed)
Cath Change/ Replacement  Patient is present today for a catheter change due to urinary retention.  10 ml of water was removed from the balloon, a 16 french foley cath was removed with out difficulty.  Patient was cleaned and prepped in a sterile fashion with betadine and 2% lidocaine jelly was instilled into the urethra. A 16 FR foley cath was replaced into the bladder no complications were noted Urine return was noted 8 ml and urine was yellow in color. The balloon was filled with 37ml of sterile water. A night bag was attached for drainage, leg bag was  Patient was given proper instruction on catheter care.    Preformed by: K.Russell, CMA

## 2015-03-03 NOTE — Progress Notes (Signed)
03/03/2015 11:52 AM   [name] [birth year] [id number]  Referring provider: Leone Haven, MD [address] [place], [place] [address]  Chief Complaint  Patient presents with  . Recurrent UTI    New patient  . Urinary Retention    HPI: The patient is a 81 year old female who presents after 550 cc urinary retention. She is also recurrent UTIs over the last 6 weeks. She has been told that the UTIs were from her inability to empty the bladder. She's not have issues with UTIs prior to this time however she had frequency, hesitancy, nocturia 4, incontinence, intermittency, and weak stream. She denies hematuria and kidney stones.     PMH: Past Medical History  Diagnosis Date  . HTN (hypertension)   . Syncope   . Hyperlipidemia   . Ischemic colitis   . Thrombocytopenia   . Mobitz type 2 second degree heart block   . Anemia   . Pacemaker   . Arthritis   . Arrhythmia   . Atrial fibrillation   . Stroke     Surgical History: Past Surgical History  Procedure Laterality Date  . Left hip replacement     . Cholecystectomy    . Pacemaker placement      Home Medications:    Medication List       This list is accurate as of: 03/03/15 11:52 AM.  Always use your most recent med list.               aspirin EC 81 MG tablet  Take 81 mg by mouth daily.     carvedilol 12.5 MG tablet  Commonly known as:  COREG  Take 12.5 mg by mouth 2 (two) times daily.     Fish Oil 1000 MG Caps  Take 2,000 mg by mouth daily.     furosemide 20 MG tablet  Commonly known as:  LASIX  Take 10 mg by mouth daily as needed for edema.     Gerhardt's butt cream Crea  Apply 1 application topically 3 (three) times daily.     hydrALAZINE 10 MG tablet  Commonly known as:  APRESOLINE  Take 10 mg by mouth 2 (two) times daily.     HYDROcodone-acetaminophen 5-325 MG tablet  Commonly known as:  NORCO/VICODIN  Take 1 tablet by mouth 4 (four) times daily as needed for moderate pain.       hydrocortisone 2.5 % cream  Apply topically 3 (three) times daily as needed.     latanoprost 0.005 % ophthalmic solution  Commonly known as:  XALATAN  Place 1 drop into both eyes at bedtime.     losartan 100 MG tablet  Commonly known as:  COZAAR  Take 50 mg by mouth 2 (two) times daily.     multivitamin with minerals Tabs tablet  Take 1 tablet by mouth daily.     nystatin cream  Commonly known as:  MYCOSTATIN  Apply 1 application topically 2 (two) times daily.     PRESERVISION AREDS 2 Caps  Take 2 capsules by mouth daily.        Allergies:  Allergies  Allergen Reactions  . Atenolol Other (See Comments)    Reaction:  Complete Heart Block  . Penicillins Other (See Comments)    Reaction:  Unknown   . Dilaudid [Hydromorphone Hcl] Rash    Family History: Family History  Problem Relation Age of Onset  . Stroke Mother   . Alcoholism Father   . Hypertension Sister   .  Prostate cancer Neg Hx   . Bladder Cancer Neg Hx   . Kidney cancer Neg Hx     Social History:  reports that she has quit smoking. She has never used smokeless tobacco. She reports that she does not drink alcohol or use illicit drugs.  ROS: UROLOGY Frequent Urination?: Yes Hard to postpone urination?: Yes Burning/pain with urination?: No Get up at night to urinate?: Yes Leakage of urine?: Yes Urine stream starts and stops?: Yes Trouble starting stream?: No Do you have to strain to urinate?: No Blood in urine?: No Urinary tract infection?: Yes Sexually transmitted disease?: No Injury to kidneys or bladder?: No Painful intercourse?: No Weak stream?: Yes Currently pregnant?: No Vaginal bleeding?: No Last menstrual period?: n  Gastrointestinal Nausea?: No Vomiting?: No Indigestion/heartburn?: No Diarrhea?: No Constipation?: No  Constitutional Fever: No Night sweats?: No Weight loss?: No Fatigue?: No  Skin Skin rash/lesions?: No Itching?: No  Eyes Blurred vision?: No Double  vision?: No  Ears/Nose/Throat Sore throat?: No Sinus problems?: No  Hematologic/Lymphatic Swollen glands?: No Easy bruising?: Yes  Cardiovascular Leg swelling?: No Chest pain?: No  Respiratory Cough?: No Shortness of breath?: No  Endocrine Excessive thirst?: No  Musculoskeletal Back pain?: Yes Joint pain?: Yes  Neurological Headaches?: No Dizziness?: No  Psychologic Depression?: No Anxiety?: No  Physical Exam: BP 162/70 mmHg  Pulse 82  Ht 5' (1.524 m)  Wt 113 lb 11.2 oz (51.574 kg)  BMI 22.21 kg/m2  Constitutional:  Alert and oriented, No acute distress. HEENT: [place] AT, moist mucus membranes.  Trachea midline, no masses. Cardiovascular: No clubbing, cyanosis, or edema. Respiratory: Normal respiratory effort, no increased work of breathing. GI: Abdomen is soft, nontender, nondistended, no abdominal masses GU: No CVA tenderness. Normal external genitalia. Mild rash of labia noted. On speculum exam, no cystocele was noted vaginal mucosa was moist. Skin: No rashes, bruises or suspicious lesions. Lymph: No cervical or inguinal adenopathy. Neurologic: Grossly intact, no focal deficits, moving all 4 extremities. Psychiatric: Normal mood and affect.  Laboratory Data: Lab Results  Component Value Date   WBC 9.0 02/09/2015   HGB 9.6* 02/09/2015   HCT 28.8* 02/09/2015   MCV 90.7 02/09/2015   PLT 90* 02/09/2015    Lab Results  Component Value Date   CREATININE 0.87 03/01/2015    No results found for: PSA  No results found for: TESTOSTERONE  No results found for: HGBA1C  Urinalysis    Component Value Date/Time   COLORURINE STRAW* 02/09/2015 [place] 01/13/2013 1238   APPEARANCEUR CLEAR* 02/09/2015 1722   [place] 01/13/2013 1238   LABSPEC 1.003* 02/09/2015 1722   LABSPEC 1.005 01/13/2013 1238   PHURINE 6.0 02/09/2015 1722   PHURINE 6.0 01/13/2013 1238   GLUCOSEU NEGATIVE 02/09/2015 1722   GLUCOSEU NEGATIVE 01/13/2013 1238     HGBUR NEGATIVE 02/09/2015 1722   HGBUR 2+ 01/13/2013 [place] 02/09/2015 1722   BILIRUBINUR NEGATIVE 01/13/2013 [place] 02/09/2015 1722   KETONESUR 1+ 01/13/2013 1238   PROTEINUR NEGATIVE 02/09/2015 1722   PROTEINUR NEGATIVE 01/13/2013 1238   NITRITE NEGATIVE 02/09/2015 1722   NITRITE POSITIVE 01/13/2013 1238   LEUKOCYTESUR 2+* 02/09/2015 1722   LEUKOCYTESUR 3+ 01/13/2013 1238    Pertinent Imaging: She had a recent CT scan showing left hydroureteronephrosis secondary to a full bladder on August 31. Hydronephrosis did resolve once her bladder is decompressed as seen on a follow-up ultrasound.  Assessment & Plan:    The patient's  urinary retention is likely from a neurogenic bladder.  This is the likely cause of recurrent urinary tract infections. I discussed with the family that there are two general ways to deal with this problem with either a Foley catheter or clean intermittent catheterization. The patient's caregivers are unable to perform CIC per company policy. So at this time, we have decided to leave a Foley catheter. This must be changed every 30 days.  1. Urinary retention -place foley catheter -change catheter monthly  2.. Recurrent urinary tract infection -This is likely secondary to above  Return in about 4 weeks (around 03/31/2015) for to assess UTIs after proper bladder drainage.  Nickie Retort, MD  [facility] [address], [place], [place] [address] [phone]

## 2015-03-07 DIAGNOSIS — I10 Essential (primary) hypertension: Secondary | ICD-10-CM | POA: Diagnosis not present

## 2015-03-07 DIAGNOSIS — B9620 Unspecified Escherichia coli [E. coli] as the cause of diseases classified elsewhere: Secondary | ICD-10-CM | POA: Diagnosis not present

## 2015-03-07 DIAGNOSIS — L89322 Pressure ulcer of left buttock, stage 2: Secondary | ICD-10-CM | POA: Diagnosis not present

## 2015-03-07 DIAGNOSIS — M8448XD Pathological fracture, other site, subsequent encounter for fracture with routine healing: Secondary | ICD-10-CM | POA: Diagnosis not present

## 2015-03-07 DIAGNOSIS — D649 Anemia, unspecified: Secondary | ICD-10-CM | POA: Diagnosis not present

## 2015-03-07 DIAGNOSIS — N390 Urinary tract infection, site not specified: Secondary | ICD-10-CM | POA: Diagnosis not present

## 2015-03-09 DIAGNOSIS — N390 Urinary tract infection, site not specified: Secondary | ICD-10-CM | POA: Diagnosis not present

## 2015-03-09 DIAGNOSIS — L89322 Pressure ulcer of left buttock, stage 2: Secondary | ICD-10-CM | POA: Diagnosis not present

## 2015-03-09 DIAGNOSIS — B9620 Unspecified Escherichia coli [E. coli] as the cause of diseases classified elsewhere: Secondary | ICD-10-CM | POA: Diagnosis not present

## 2015-03-09 DIAGNOSIS — M8448XD Pathological fracture, other site, subsequent encounter for fracture with routine healing: Secondary | ICD-10-CM | POA: Diagnosis not present

## 2015-03-09 DIAGNOSIS — D649 Anemia, unspecified: Secondary | ICD-10-CM | POA: Diagnosis not present

## 2015-03-09 DIAGNOSIS — I10 Essential (primary) hypertension: Secondary | ICD-10-CM | POA: Diagnosis not present

## 2015-03-10 ENCOUNTER — Inpatient Hospital Stay (HOSPITAL_BASED_OUTPATIENT_CLINIC_OR_DEPARTMENT_OTHER): Payer: Medicare Other | Admitting: Hematology and Oncology

## 2015-03-10 ENCOUNTER — Inpatient Hospital Stay: Payer: Medicare Other | Attending: Hematology and Oncology

## 2015-03-10 ENCOUNTER — Other Ambulatory Visit: Payer: Self-pay | Admitting: Hematology and Oncology

## 2015-03-10 ENCOUNTER — Encounter: Payer: Self-pay | Admitting: Hematology and Oncology

## 2015-03-10 ENCOUNTER — Inpatient Hospital Stay: Payer: Medicare Other

## 2015-03-10 VITALS — BP 137/79 | HR 82 | Temp 98.6°F | Resp 17 | Ht 60.0 in | Wt 105.4 lb

## 2015-03-10 DIAGNOSIS — E538 Deficiency of other specified B group vitamins: Secondary | ICD-10-CM | POA: Insufficient documentation

## 2015-03-10 DIAGNOSIS — D72821 Monocytosis (symptomatic): Secondary | ICD-10-CM | POA: Diagnosis not present

## 2015-03-10 DIAGNOSIS — I10 Essential (primary) hypertension: Secondary | ICD-10-CM | POA: Insufficient documentation

## 2015-03-10 DIAGNOSIS — Z79899 Other long term (current) drug therapy: Secondary | ICD-10-CM | POA: Diagnosis not present

## 2015-03-10 DIAGNOSIS — I4891 Unspecified atrial fibrillation: Secondary | ICD-10-CM | POA: Insufficient documentation

## 2015-03-10 DIAGNOSIS — Z8673 Personal history of transient ischemic attack (TIA), and cerebral infarction without residual deficits: Secondary | ICD-10-CM | POA: Diagnosis not present

## 2015-03-10 DIAGNOSIS — D696 Thrombocytopenia, unspecified: Secondary | ICD-10-CM

## 2015-03-10 DIAGNOSIS — E785 Hyperlipidemia, unspecified: Secondary | ICD-10-CM | POA: Insufficient documentation

## 2015-03-10 LAB — CBC WITH DIFFERENTIAL/PLATELET
Basophils Absolute: 0 10*3/uL (ref 0–0.1)
Basophils Relative: 0 %
Eosinophils Absolute: 0.1 10*3/uL (ref 0–0.7)
Eosinophils Relative: 1 %
HCT: 32.2 % — ABNORMAL LOW (ref 35.0–47.0)
Hemoglobin: 10.8 g/dL — ABNORMAL LOW (ref 12.0–16.0)
Lymphocytes Relative: 17 %
Lymphs Abs: 1.1 10*3/uL (ref 1.0–3.6)
MCH: 30.1 pg (ref 26.0–34.0)
MCHC: 33.6 g/dL (ref 32.0–36.0)
MCV: 89.5 fL (ref 80.0–100.0)
Monocytes Absolute: 1.5 10*3/uL — ABNORMAL HIGH (ref 0.2–0.9)
Monocytes Relative: 22 %
Neutro Abs: 3.9 10*3/uL (ref 1.4–6.5)
Neutrophils Relative %: 60 %
Platelets: 108 10*3/uL — ABNORMAL LOW (ref 150–440)
RBC: 3.6 MIL/uL — ABNORMAL LOW (ref 3.80–5.20)
RDW: 15.4 % — ABNORMAL HIGH (ref 11.5–14.5)
WBC: 6.6 10*3/uL (ref 3.6–11.0)

## 2015-03-10 MED ORDER — CYANOCOBALAMIN 1000 MCG/ML IJ SOLN
1000.0000 ug | Freq: Once | INTRAMUSCULAR | Status: AC
  Administered 2015-03-10: 1000 ug via INTRAMUSCULAR
  Filled 2015-03-10: qty 1

## 2015-03-10 NOTE — Progress Notes (Signed)
[facility] day:  02/10/2015  Chief Complaint: [name] is a 81 y.o. female with thrombocytopenia and persistent monocytosis who is seen for assessment after recent hospitalization.  HPI: The patient was admitted to [facility]) from 02/01/2015 until 02/07/2015 with sepsis secondary to recurrent UTI. She had a history of thrombocytopenia dating back to 01/13/2013. At that time, she was admitted colitis. Counts included a hematocrit of 37.2, hemoglobin 12.8, WBC 25,600, and platelets 46,000. Nadir platelet count was 17,000 on 01/16/2013. Following recovery of infection, counts on 01/22/2013 included a platelet count of 136,000.  She was seen in the ER on 12/23/2014 with weakness and fatigue. She was diagnosed with an E coli UTI (resistant to ampicillin, ciprofloxacin, gentamicin, and intermediate sensitive to ampicillin/sulbactam). Counts included a hematocrit of 34.8, hemoglobin 11.8, WBC 13,600 and platelets 73,000. She received ceftriaxone in the ER and was sent home on a course of Keflex.  She represented on 01/10/2015 with confusion and lethargy. She was diagnoses with E coli UTI (identical sensitivities to 12/23/2014) and sepsis. Blood cultures were positive. She received 2 days of ceftriaxone and was discharged on Septra for 10 days. Initial CBC revealed a hematocrit of 34.2, hemoglobin 11.5, WBC 17,600, and platelets 69,000. Discharge platelet count was 70,000.  She presented to [facility] on 01/31/2015 with weakness and fatigue, fever, nocturia, frequency, dysuria, and hip and back pain. Urine and blood cultures grew E coli (reistant to ampicillin, gentamicin, and ciprofloxacin). She was placed on ceftriaxone. Initial counts included a hematocrit of 34.8, hemoglobin 11.6, WBC 19,200 and platelets 62,000. Platelet count on 08/31 was 46,000 and 12,000 on 02/02/2015.  Abdominal and pelvic CT scan on 02/01/2015  revealed left sided hydronephrosis with perinephric fat stranding (obstructive uropathy and/or pyelonephritis) and a new compression fracture of T12 (age indeterminate).  She was seen in consultation.  Thrombocytopenia was felt secondary to consumption associated with infection and poor marrow reserve secondary to her age. In addition, she had undergone a course of Septra, a known myelosuppressive agent, prior to admission.  Peripheral smear revealed marked thrombocytopenia, scattered teardrop cells, and rare schistocytes. Creatinine was at baseline (0.87).There was no evidence of TTP.Coagulation studies ruled out DIC. PT/INR was 16.7/1.33 and PTT 43 (24-36).  B12 was low normal (284) with an elevated MMA (390) consistent with B12 deficiency.  SPEP on 02/03/2015 revealed no monoclonal protein.  Kappa free light chains were 37.49 (mildy elevated) with lambda free light chains 22.26 and a ratio of 1.68 (0.26-1.65).  ANA was negative.  On review of WBC differentials dating back to 01/2013, she had persistent monocytosis, possibly indicating CMML. BCR-ABL on 02/05/2015 was negative thus ruling out CML.  Flow cytometry on 02/05/2015 revealed absolute monocytosis with phenotypic aberrancies.  There was dim partial aberrant upregulation of CD56 and mild down-regulation of CD11b, CD11c and HLA-DR. Changes were felt nonspecific, but have been associated with reactive monocytosis and myelodysplastic/myeloproliferative disorders.    At the time of discharge on 02/07/2015, CBC revealed a hematocrit of 30, hemoglobin 10.2, platelets 43,000, white count 8900 with an [place] of absolute monocyte count was 2100.   The patient was seen in the emergency room last night secondary to urinary retention.  Labs on 02/09/2015 included a 28.8, hemoglobin 9.6, platelets 90,000, white count 9000 with an [place] of 5700.  Absolute monocyte count was 2000.  She plans to start physical therapy next week. Currently she is receiving 24/ 7  care.  Diet is fair.  She denies any herbal products or quinine water. She denies any bone pain.  She denies any bruising or bleeding.  Past Medical History  Diagnosis Date  . HTN (hypertension)   . Syncope   . Hyperlipidemia   . Ischemic colitis   . Thrombocytopenia   . Mobitz type 2 second degree heart block   . Anemia   . Pacemaker   . Arthritis   . Arrhythmia   . Atrial fibrillation   . Stroke     Past Surgical History  Procedure Laterality Date  . Left hip replacement     . Cholecystectomy    . Pacemaker placement      Family History  Problem Relation Age of Onset  . Stroke Mother   . Alcoholism Father   . Hypertension Sister   . Prostate cancer Neg Hx   . Bladder Cancer Neg Hx   . Kidney cancer Neg Hx     Social History:  reports that she has quit smoking. She has never used smokeless tobacco. She reports that she does not drink alcohol or use illicit drugs.  The patient is accompanied by her 2 daughters today.  Allergies:  Allergies  Allergen Reactions  . Atenolol Other (See Comments)    Reaction:  Complete Heart Block  . Penicillins Other (See Comments)    Reaction:  Unknown   . Dilaudid [Hydromorphone Hcl] Rash    Current Medications: Current Outpatient Prescriptions  Medication Sig Dispense Refill  . aspirin EC 81 MG tablet Take 81 mg by mouth daily.    . carvedilol (COREG) 12.5 MG tablet Take 12.5 mg by mouth 2 (two) times daily.    . furosemide (LASIX) 20 MG tablet Take 10 mg by mouth daily as needed for edema.     . hydrALAZINE (APRESOLINE) 10 MG tablet Take 10 mg by mouth 2 (two) times daily.     Marland Kitchen HYDROcodone-acetaminophen (NORCO/VICODIN) 5-325 MG per tablet Take 1 tablet by mouth 4 (four) times daily as needed for moderate pain.    Marland Kitchen Hydrocortisone (GERHARDT'S BUTT CREAM) CREA Apply 1 application topically 3 (three) times daily. 1 each 2  . latanoprost (XALATAN) 0.005 % ophthalmic solution Place 1 drop into both eyes at bedtime.    Marland Kitchen losartan  (COZAAR) 100 MG tablet Take 50 mg by mouth 2 (two) times daily.    . Multiple Vitamin (MULTIVITAMIN WITH MINERALS) TABS tablet Take 1 tablet by mouth daily.    . Multiple Vitamins-Minerals (PRESERVISION AREDS 2) CAPS Take 2 capsules by mouth daily.    . Omega-3 Fatty Acids (FISH OIL) 1000 MG CAPS Take 2,000 mg by mouth daily.     . hydrocortisone 2.5 % cream Apply topically 3 (three) times daily as needed. 28 g 3  . nystatin cream (MYCOSTATIN) Apply 1 application topically 2 (two) times daily. 30 g 0   No current facility-administered medications for this visit.    Review of Systems:  GENERAL:  Feels "ok".  No fevers, sweats or weight loss. PERFORMANCE STATUS (ECOG):  2 HEENT:  No visual changes, runny nose, sore throat, mouth sores or tenderness. Lungs: Shortness of breath with exertion.  No cough.  No hemoptysis. Cardiac:  No chest pain, palpitations, orthopnea, or PND. GI:  Modest appetite.  No nausea, vomiting, diarrhea, constipation, melena or hematochezia. GU:  Urinary retention leading to Foley catheter.  No urgency, frequency, dysuria, or hematuria. Musculoskeletal:  Back and hip pain.  No muscle tenderness. Extremities:  No pain or swelling. Skin:  No rashes or skin changes. Neuro:  No headache, numbness or weakness, balance or coordination issues. Endocrine:  No diabetes, thyroid issues, hot flashes or night sweats. Psych:  No mood changes, depression or anxiety. Pain:  No focal pain. Review of systems:  All other systems reviewed and found to be negative.  Physical Exam: Blood pressure 130/73, pulse 83, temperature 98.2 F (36.8 C), temperature source Tympanic, weight 119 lb (53.978 kg). GENERAL:  Thin elderly woman sitting comfortably in a wheelchair in the exam room in no acute distress. MENTAL STATUS:  Alert and oriented to person, place and time. HEAD:  Blonde short hair.  Normocephalic, atraumatic, face symmetric, no Cushingoid features. EYES:  Glasses.  Brown eyes.   Pupils equal round and reactive to light and accomodation.  No conjunctivitis or scleral icterus. ENT:  Oropharynx clear without lesion. Dentures. Tongue normal. Mucous membranes moist.  RESPIRATORY:  Clear to auscultation without rales, wheezes or rhonchi. CARDIOVASCULAR:  Regular rate and rhythm without murmur, rub or gallop. ABDOMEN:  Soft, non-tender, with active bowel sounds, and no hepatosplenomegaly.  No masses. SKIN:  No rashes, ulcers or lesions. EXTREMITIES: No edema, no skin discoloration or tenderness.  No palpable cords. LYMPH NODES: No palpable cervical, supraclavicular, axillary or inguinal adenopathy  NEUROLOGICAL: Unremarkable. PSYCH:  Appropriate.  Admission on 02/09/2015, Discharged on 02/09/2015  Component Date Value Ref Range Status  . Color, Urine 02/09/2015 STRAW* YELLOW Final  . APPearance 02/09/2015 CLEAR* CLEAR Final  . Glucose, UA 02/09/2015 NEGATIVE  NEGATIVE mg/dL Final  . Bilirubin Urine 02/09/2015 NEGATIVE  NEGATIVE Final  . Ketones, ur 02/09/2015 NEGATIVE  NEGATIVE mg/dL Final  . Specific Gravity, Urine 02/09/2015 1.003* 1.005 - 1.030 Final  . Hgb urine dipstick 02/09/2015 NEGATIVE  NEGATIVE Final  . pH 02/09/2015 6.0  5.0 - 8.0 Final  . Protein, ur 02/09/2015 NEGATIVE  NEGATIVE mg/dL Final  . Nitrite 02/09/2015 NEGATIVE  NEGATIVE Final  . Leukocytes, UA 02/09/2015 2+* NEGATIVE Final  . RBC / HPF 02/09/2015 6-30  0 - 5 RBC/hpf Final  . WBC, UA 02/09/2015 NONE SEEN  0 - 5 WBC/hpf Final  . Bacteria, UA 02/09/2015 RARE* NONE SEEN Final  . Squamous Epithelial / LPF 02/09/2015 0-5* NONE SEEN Final  . Sodium 02/09/2015 134* 135 - 145 mmol/L Final  . Potassium 02/09/2015 4.4  3.5 - 5.1 mmol/L Final  . Chloride 02/09/2015 102  101 - 111 mmol/L Final  . CO2 02/09/2015 23  22 - 32 mmol/L Final  . Glucose, Bld 02/09/2015 114* 65 - 99 mg/dL Final  . BUN 02/09/2015 9  6 - 20 mg/dL Final  . Creatinine, Ser 02/09/2015 1.04* 0.44 - 1.00 mg/dL Final  . Calcium  02/09/2015 8.8* 8.9 - 10.3 mg/dL Final  . GFR calc non Af Amer 02/09/2015 46* >60 mL/min Final  . GFR calc Af Amer 02/09/2015 53* >60 mL/min Final   Comment: (NOTE) The eGFR has been calculated using the CKD EPI equation. This calculation has not been validated in all clinical situations. eGFR's persistently <60 mL/min signify possible Chronic Kidney Disease.   . Anion gap 02/09/2015 9  5 - 15 Final  . WBC 02/09/2015 9.0  3.6 - 11.0 K/uL Final  . RBC 02/09/2015 3.17* 3.80 - 5.20 MIL/uL Final  . Hemoglobin 02/09/2015 9.6* 12.0 - 16.0 g/dL Final  . HCT 02/09/2015 28.8* 35.0 - 47.0 % Final  . MCV 02/09/2015 90.7  80.0 - 100.0 fL Final  . MCH 02/09/2015 30.1  26.0 - 34.0 pg  Final  . MCHC 02/09/2015 33.2  32.0 - 36.0 g/dL Final  . RDW 02/09/2015 14.7* 11.5 - 14.5 % Final  . Platelets 02/09/2015 90* 150 - 440 K/uL Final  . Neutrophils Relative % 02/09/2015 64%   Final  . Neutro Abs 02/09/2015 5.7  1.4 - 6.5 K/uL Final  . Lymphocytes Relative 02/09/2015 13%   Final  . Lymphs Abs 02/09/2015 1.2  1.0 - 3.6 K/uL Final  . Monocytes Relative 02/09/2015 22%   Final  . Monocytes Absolute 02/09/2015 2.0* 0.2 - 0.9 K/uL Final  . Eosinophils Relative 02/09/2015 1%   Final  . Eosinophils Absolute 02/09/2015 0.1  0 - 0.7 K/uL Final  . Basophils Relative 02/09/2015 0%   Final  . Basophils Absolute 02/09/2015 0.0  0 - 0.1 K/uL Final  . Specimen Description 02/09/2015 URINE, RANDOM   Final  . Special Requests 02/09/2015 NONE   Final  . Culture 02/09/2015 NO GROWTH 2 DAYS   Final  . Report Status 02/09/2015 02/12/2015 FINAL   Final    Assessment:  [name] is a 81 y.o. 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  She has a history of recurrent E coli UTI and associated bacteremia. She has a history of thrombocytopenia secondary to consumption associated with infection. She has poor marrow reserve secondary to her age.She has been treated with Septra, known  myelosuppressant agent.  Work-up on 02/03/2015 revealed a low normal B12 (284) with an elevated MMA (390) consistent with a B12 deficiency.  SPEP on 02/03/2015 revealed no monoclonal protein.  Free light chains were negative.  ANA was negative. Coagulation studies were normal.  Creatinine was 0.87.  Peripheral smear on 02/02/2015 revealed marked thrombocytopenia, scattered teardrop cells, and rare schistocytes. BCR-ABL on 02/05/2015 was negative, ruling out CML.  Flow cytometry on 02/05/2015 revealed absolute monocytosis with phenotypic aberrancies.  There was dim partial aberrant upregulation of CD56 and mild down-regulation of CD11b, CD11c and HLA-DR. Changes can be associated with reactive monocytosis and  myelodysplastic/myeloproliferative disorders.    Symptomatically, she was seen in the ER last night secondary to urinary retention.  Platelet count is improving (90,000)   Plan: 1. Review hospital evaluation and probable diagnosis of an underlying myelodysplastic/myeloproliferative disorder (CMML).  Discuss plan for observation at this point given improving platelet count.  Discuss indications for bone marrow biopsy and treatment. 2. Discuss management of B12 deficiency. 3. Begin B12 today then weekly x 5 then monthly. 4. RTC in 1 month for MD assess, labs (CBC with diff), and B12.   Lequita Asal, MD  02/10/2015

## 2015-03-10 NOTE — Progress Notes (Signed)
[facility] day: 03/10/2015  Chief Complaint: [name] is a 81 y.o. female with thrombocytopenia and persistent monocytosis who is seen for 1 month assessment.  HPI: The patient  was last seen in the medical oncology clinic on 02/10/2015.  At that time, she was seen for assessment after a recent hospitalization with sepsis secondary to recurrent UTI. She had a history of thrombocytopenia dating back to 01/13/2013.  She also had a persistent monocytosis.    Work-up had suggested a possible myelodysplastic/myeloproliferative disorder such as CMML.  Flow cytometry on 02/05/2015 revealed absolute monocytosis with phenotypic aberrancies.  There was dim partial aberrant upregulation of CD56 and mild down-regulation of CD11b, CD11c and HLA-DR. Changes were felt nonspecific, but have been associated with reactive monocytosis and myelodysplastic/myeloproliferative disorders.    Labs on 02/09/2015 included a 28.8, hemoglobin 9.6, platelets 90,000, white count 9000 with an [place] of 5700.  Absolute monocyte count was 2000.  She has been receiving her B12 weekly.  This is her third B12 of 6 weekly injections before receiving B12 monthly for documented B12 deficiency  During the interim, she states that she is walking with a walker. She finished physical therapy. She is able to take care of herself. An advanced nurse does come in and take care of her catheter.  She is eating better.  She describes eating a big dinner and whatever she wants for breakfast. She likes [employer].  She has had no infections. She denies any bruising or bleeding.  Past Medical History  Diagnosis Date  . HTN (hypertension)   . Syncope   . Hyperlipidemia   . Ischemic colitis ([place])   . Thrombocytopenia ([place])   . Mobitz type 2 second degree heart block   . Anemia   . Pacemaker   . Arthritis   . Arrhythmia   . Atrial fibrillation ([place])   . Stroke [facility])     Past  Surgical History  Procedure Laterality Date  . Left hip replacement     . Cholecystectomy    . Pacemaker placement      Family History  Problem Relation Age of Onset  . Stroke Mother   . Alcoholism Father   . Hypertension Sister   . Prostate cancer Neg Hx   . Bladder Cancer Neg Hx   . Kidney cancer Neg Hx     Social History:  reports that she has never smoked. She has never used smokeless tobacco. She reports that she drinks about 0.6 oz of alcohol per week. She reports that she does not use illicit drugs.  The patient is accompanied by her 2 daughters today.  Allergies:  Allergies  Allergen Reactions  . Atenolol Other (See Comments)    Reaction:  Complete Heart Block  . Penicillins Other (See Comments)    Reaction:  Unknown   . Dilaudid [Hydromorphone Hcl] Rash    Current Medications: Current Outpatient Prescriptions  Medication Sig Dispense Refill  . aspirin EC 81 MG tablet Take 81 mg by mouth daily.    . carvedilol (COREG) 12.5 MG tablet Take 12.5 mg by mouth 2 (two) times daily.    . furosemide (LASIX) 20 MG tablet Take 10 mg by mouth daily as needed for edema.     . hydrALAZINE (APRESOLINE) 10 MG tablet Take 10 mg by mouth 2 (two) times daily.     Marland Kitchen HYDROcodone-acetaminophen (NORCO/VICODIN) 5-325 MG per tablet Take 1 tablet by mouth 4 (four) times daily  as needed for moderate pain.    . hydrocortisone 2.5 % cream Apply topically 3 (three) times daily as needed. 28 g 3  . latanoprost (XALATAN) 0.005 % ophthalmic solution Place 1 drop into both eyes at bedtime.    Marland Kitchen losartan (COZAAR) 100 MG tablet Take 50 mg by mouth 2 (two) times daily.    . Multiple Vitamin (MULTIVITAMIN WITH MINERALS) TABS tablet Take 1 tablet by mouth daily.    . Multiple Vitamins-Minerals (PRESERVISION AREDS 2) CAPS Take 2 capsules by mouth daily.    Marland Kitchen nystatin cream (MYCOSTATIN) Apply 1 application topically 2 (two) times daily. 30 g 0  . Omega-3 Fatty Acids (FISH OIL) 1000 MG CAPS Take 2,000 mg by  mouth daily.     . Hydrocortisone (GERHARDT'S BUTT CREAM) CREA Apply 1 application topically 3 (three) times daily. (Patient not taking: Reported on 03/10/2015) 1 each 2   No current facility-administered medications for this visit.    Review of Systems:  GENERAL:  Feels good.  Does whatever she wants.  No fevers, sweats or weight loss. PERFORMANCE STATUS (ECOG):  2 HEENT:  No visual changes, runny nose, sore throat, mouth sores or tenderness. Lungs: Shortness of breath with exertion.  No cough.  No hemoptysis. Cardiac:  No chest pain, palpitations, orthopnea, or PND. GI:  Modest appetite.  No nausea, vomiting, diarrhea, constipation, melena or hematochezia. GU:  Urinary retention with Foley catheter.  No urgency, frequency, dysuria, or hematuria. Musculoskeletal:  Back and hip pain.  No muscle tenderness. Extremities:  No pain or swelling. Skin:  No rashes or skin changes. Neuro:  No headache, numbness or weakness, balance or coordination issues. Endocrine:  No diabetes, thyroid issues, hot flashes or night sweats. Psych:  No mood changes, depression or anxiety. Pain:  No focal pain. Review of systems:  All other systems reviewed and found to be negative.  Physical Exam: Blood pressure 137/79, pulse 82, temperature 98.6 F (37 C), temperature source Tympanic, resp. rate 17, height 5' (1.524 m), weight 105 lb 6.1 oz (47.8 kg). GENERAL:  Thin elderly woman sitting comfortably in a wheelchair in the exam room in no acute distress. MENTAL STATUS:  Alert and oriented to person, place and time. HEAD:  Short blonde short hair.  Normocephalic, atraumatic, face symmetric, no Cushingoid features. EYES:  Glasses.  Brown eyes.  Pupils equal round and reactive to light and accomodation.  No conjunctivitis or scleral icterus. ENT:  Oropharynx clear without lesion. Dentures. Tongue normal. Mucous membranes moist.  RESPIRATORY:  Clear to auscultation without rales, wheezes or rhonchi. CARDIOVASCULAR:   Regular rate and rhythm without murmur, rub or gallop. ABDOMEN:  Soft, non-tender, with active bowel sounds, and no hepatosplenomegaly.  No masses. SKIN:  No rashes, ulcers or lesions. EXTREMITIES: No edema, no skin discoloration or tenderness.  No palpable cords. LYMPH NODES: No palpable cervical, supraclavicular, axillary or inguinal adenopathy  NEUROLOGICAL: Unremarkable. PSYCH:  Appropriate.  Appointment on 03/10/2015  Component Date Value Ref Range Status  . WBC 03/10/2015 6.6  3.6 - 11.0 K/uL Final  . RBC 03/10/2015 3.60* 3.80 - 5.20 MIL/uL Final  . Hemoglobin 03/10/2015 10.8* 12.0 - 16.0 g/dL Final  . HCT 03/10/2015 32.2* 35.0 - 47.0 % Final  . MCV 03/10/2015 89.5  80.0 - 100.0 fL Final  . MCH 03/10/2015 30.1  26.0 - 34.0 pg Final  . MCHC 03/10/2015 33.6  32.0 - 36.0 g/dL Final  . RDW 03/10/2015 15.4* 11.5 - 14.5 % Final  . Platelets 03/10/2015  108* 150 - 440 K/uL Final  . Neutrophils Relative % 03/10/2015 60   Final  . Neutro Abs 03/10/2015 3.9  1.4 - 6.5 K/uL Final  . Lymphocytes Relative 03/10/2015 17   Final  . Lymphs Abs 03/10/2015 1.1  1.0 - 3.6 K/uL Final  . Monocytes Relative 03/10/2015 22   Final  . Monocytes Absolute 03/10/2015 1.5* 0.2 - 0.9 K/uL Final  . Eosinophils Relative 03/10/2015 1   Final  . Eosinophils Absolute 03/10/2015 0.1  0 - 0.7 K/uL Final  . Basophils Relative 03/10/2015 0   Final  . Basophils Absolute 03/10/2015 0.0  0 - 0.1 K/uL Final    Assessment:  Minyon Billiter is a 81 y.o. 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  She has a history of recurrent E coli UTI and associated bacteremia. She has a history of thrombocytopenia secondary to consumption associated with infection. She has poor marrow reserve secondary to her age.She has been treated with Septra, known myelosuppressant agent.  Work-up on 02/03/2015 revealed a low normal B12 (284) with an elevated MMA (390) consistent with a B12 deficiency.  SPEP on  02/03/2015 revealed no monoclonal protein.  Free light chains were negative.  ANA was negative. Coagulation studies were normal.  Creatinine was 0.87.  She began B12 supplimentation on 02/17/2015.  Peripheral smear on 02/02/2015 revealed marked thrombocytopenia, scattered teardrop cells, and rare schistocytes. BCR-ABL on 02/05/2015 was negative, ruling out CML.  Flow cytometry on 02/05/2015 revealed absolute monocytosis with phenotypic aberrancies.  There was dim partial aberrant upregulation of CD56 and mild down-regulation of CD11b, CD11c and HLA-DR. Changes can be associated with reactive monocytosis and  myelodysplastic/myeloproliferative disorders.    Symptomatically, she is getting stronger.  Her performance status is improving.  Platelet count is improving (108,000)   Plan: 1.  Review lab trend- improving.  No indication for bone marrow.  Continue observation. 2.  Labs today:  CBC with diff. 3.  B12 today. 4.  RTC weekly x 3 (10/14, 10/21, and 10/28) for B12. 5.  RTC on 11/28 for MD assess, labs (CBC with diff, CMP) + B12   [name] Asal, MD  03/10/2015

## 2015-03-10 NOTE — Progress Notes (Signed)
Pt reports seeing Dr. Mike Gip and coming for follow up after hospital stay.  Has foley in place because bladder does not completely empty follows up with urology.  Foley to be changed q30 days.

## 2015-03-13 ENCOUNTER — Encounter: Payer: Self-pay | Admitting: Hematology and Oncology

## 2015-03-13 ENCOUNTER — Encounter: Payer: Medicare Other | Attending: General Surgery | Admitting: General Surgery

## 2015-03-13 ENCOUNTER — Encounter: Payer: Self-pay | Admitting: General Surgery

## 2015-03-13 DIAGNOSIS — L89311 Pressure ulcer of right buttock, stage 1: Secondary | ICD-10-CM | POA: Diagnosis not present

## 2015-03-13 DIAGNOSIS — I10 Essential (primary) hypertension: Secondary | ICD-10-CM | POA: Diagnosis not present

## 2015-03-13 DIAGNOSIS — L89322 Pressure ulcer of left buttock, stage 2: Secondary | ICD-10-CM | POA: Insufficient documentation

## 2015-03-13 DIAGNOSIS — L89321 Pressure ulcer of left buttock, stage 1: Secondary | ICD-10-CM

## 2015-03-13 NOTE — Progress Notes (Signed)
seeiheal 

## 2015-03-14 DIAGNOSIS — L89322 Pressure ulcer of left buttock, stage 2: Secondary | ICD-10-CM | POA: Diagnosis not present

## 2015-03-14 DIAGNOSIS — N390 Urinary tract infection, site not specified: Secondary | ICD-10-CM | POA: Diagnosis not present

## 2015-03-14 DIAGNOSIS — B9620 Unspecified Escherichia coli [E. coli] as the cause of diseases classified elsewhere: Secondary | ICD-10-CM | POA: Diagnosis not present

## 2015-03-14 DIAGNOSIS — M8448XD Pathological fracture, other site, subsequent encounter for fracture with routine healing: Secondary | ICD-10-CM | POA: Diagnosis not present

## 2015-03-14 DIAGNOSIS — D649 Anemia, unspecified: Secondary | ICD-10-CM | POA: Diagnosis not present

## 2015-03-14 DIAGNOSIS — I10 Essential (primary) hypertension: Secondary | ICD-10-CM | POA: Diagnosis not present

## 2015-03-14 NOTE — Progress Notes (Signed)
WARGA, Makinzie ([id number]) Visit Report for 03/13/2015 Abuse/Suicide Risk Screen Details Patient Name: [name], [name] 03/13/2015 9:30 Date of Service: AM Medical Record [id number] Number: Patient Account Number: [id number] Apr 27, 1923 (81 y.o. Treating RN: Montey Hora Date of Birth/Sex: Female) Other Clinician: Primary Care Physician: Caryl Bis, ERIC Treating PARKER, PETER Referring Physician: Caryl Bis, ERIC Physician/Extender: Weeks in Treatment: 0 Abuse/Suicide Risk Screen Items Answer ABUSE/SUICIDE RISK SCREEN: Has anyone close to you tried to hurt or harm you recentlyo No Do you feel uncomfortable with anyone in your familyo No Has anyone forced you do things that you didnot want to doo No Do you have any thoughts of harming yourselfo No Patient displays signs or symptoms of abuse and/or neglect. No Electronic Signature(s) Signed: 03/13/2015 5:24:25 PM By: Montey Hora Entered By: Montey Hora on 03/13/2015 09:51:10 Aispuro, Meline ([id number]) -------------------------------------------------------------------------------- Activities of Daily Living Details Patient Name: [name] 03/13/2015 9:30 Date of Service: AM Medical Record [id number] Number: Patient Account Number: [id number] [birth year] (81 y.o. Treating RN: Montey Hora Date of Birth/Sex: Female) Other Clinician: Primary Care Physician: Caryl Bis, [place], [place] Referring Physician: Caryl Bis, ERIC Physician/Extender: [place] in Treatment: 0 Activities of Daily Living Items Answer Activities of Daily Living (Please select one for each item) Drive Automobile Not Able Take Medications Need Assistance Use Telephone Completely Able Care for Appearance Need Assistance Use Toilet Completely Able Bath / Shower Need Assistance Dress Self Need Assistance Feed Self Completely Able Walk Need Assistance Get In / Out Bed Need Assistance Housework Need Assistance Prepare Meals Need  Assistance Handle Money Need Assistance Shop for Self Need Assistance Electronic Signature(s) Signed: 03/13/2015 5:24:25 PM By: Montey Hora Entered By: Montey Hora on 03/13/2015 09:52:47 Scadden, Addalyne ([id number]) -------------------------------------------------------------------------------- Education Assessment Details Patient Name: [name] 03/13/2015 9:30 Date of Service: AM Medical Record [id number] Number: Patient Account Number: [id number] November 17, 1922 (81 y.o. Treating RN: Montey Hora Date of Birth/Sex: Female) Other Clinician: Primary Care Physician: Caryl Bis, ERIC Treating Jerline Pain, PETER Referring Physician: Caryl Bis, ERIC Physician/Extender: [place] in Treatment: 0 Primary Learner Assessed: Caregiver dtr Reason Patient is not Primary Learner: location of ulcer Learning Preferences/Education Level/Primary Language Learning Preference: Explanation, Demonstration Highest Education Level: High School Preferred Language: English Cognitive Barrier Assessment/Beliefs Language Barrier: No Translator Needed: No Memory Deficit: No Emotional Barrier: No Cultural/Religious Beliefs Affecting Medical No Care: Physical Barrier Assessment Impaired Vision: No Impaired Hearing: Yes Hearing Aid Decreased Hand dexterity: No Knowledge/Comprehension Assessment Knowledge Level: Medium Comprehension Level: Medium Ability to understand written Medium instructions: Ability to understand verbal Medium instructions: Motivation Assessment Anxiety Level: Calm Cooperation: Cooperative Education Importance: Acknowledges Need Interest in Health Problems: Asks Questions Perception: Coherent Willingness to Engage in Self- Medium Management Activities: Ardis Lawley, [place] ([id number]) Readiness to Engage in Self- Management Activities: Electronic Signature(s) Signed: 03/13/2015 5:24:25 PM By: Montey Hora Entered By: Montey Hora on 03/13/2015 09:53:31 Huffaker,  Danniell ([id number]) -------------------------------------------------------------------------------- Fall Risk Assessment Details Patient Name: [name] Fillers, [name] 03/13/2015 9:30 Date of Service: AM Medical Record [id number] Number: Patient Account Number: [id number] [birth year] (81 y.o. Treating RN: Montey Hora Date of Birth/Sex: Female) Other Clinician: Primary Care Physician: Caryl Bis, ERIC Treating Jerline Pain, PETER Referring Physician: Caryl Bis, ERIC Physician/Extender: [place] in Treatment: 0 Fall Risk Assessment Items FALL RISK ASSESSMENT: History of falling - immediate or within 3 months 0 No Secondary diagnosis 0 No Ambulatory aid None/bed rest/wheelchair/nurse 0 No Crutches/cane/walker 15 Yes Furniture 0 No IV Access/Saline Lock 0 No Gait/Training Normal/bed rest/immobile 0 No Weak 10 Yes Impaired 0 No Mental Status Oriented to own ability 0 Yes Electronic Signature(s)  Signed: 03/13/2015 5:24:25 PM By: Montey Hora Entered By: Montey Hora on 03/13/2015 09:53:47 Milanes, Asmaa ([id number]) -------------------------------------------------------------------------------- Nutrition Risk Assessment Details Patient Name: [name] 03/13/2015 9:30 Date of Service: AM Medical Record [id number] Number: Patient Account Number: [id number] Sep 02, 1922 (81 y.o. Treating RN: Montey Hora Date of Birth/Sex: Female) Other Clinician: Primary Care Physician: Caryl Bis, ERIC Treating Jerline Pain, PETER Referring Physician: Caryl Bis, ERIC Physician/Extender: [place] in Treatment: 0 Height (in): 60 Weight (lbs): 114 Body Mass Index (BMI): 22.3 Nutrition Risk Assessment Items NUTRITION RISK SCREEN: I have an illness or condition that made me change the kind and/or 0 No amount of food I eat I eat fewer than two meals per day 0 No I eat few fruits and vegetables, or milk products 0 No I have three or more drinks of beer, liquor or wine almost every day 0 No I have tooth or mouth  problems that make it hard for me to eat 0 No I don't always have enough money to buy the food I need 0 No I eat alone most of the time 0 No I take three or more different prescribed or over-the-counter drugs a 1 Yes day Without wanting to, I have lost or gained 10 pounds in the last six 0 No months I am not always physically able to shop, cook and/or feed myself 0 No Nutrition Protocols Good Risk Protocol 0 No interventions needed Moderate Risk Protocol Electronic Signature(s) Signed: 03/13/2015 5:24:25 PM By: Montey Hora Entered By: Montey Hora on 03/13/2015 09:53:59

## 2015-03-14 NOTE — Progress Notes (Addendum)
MCCORVEY, [name] ([id number]) Visit Report for 03/13/2015 Allergy List Details Patient Name: [name], [name] [facility] Date of Service: 03/13/2015 9:30 AM Medical Record Number: [id number] Patient Account Number: [id number] Date of Birth/Sex: Jan 09, 1923 (81 y.o. Female) Treating RN: Montey Hora Primary Care Physician: Caryl Bis, ERIC Other Clinician: Referring Physician: Caryl Bis, ERIC Treating Physician/Extender: Judene Companion Weeks in Treatment: 0 Allergies Active Allergies penicillin atenolol Dilaudid Allergy Notes Electronic Signature(s) Signed: 03/13/2015 5:24:25 PM By: Montey Hora Entered By: Montey Hora on 03/13/2015 09:45:29 Porrata, Concettina ([id number]) -------------------------------------------------------------------------------- Arrival Information Details Patient Name: [name] Date of Service: 03/13/2015 9:30 AM Medical Record Number: [id number] Patient Account Number: [id number] Date of Birth/Sex: 10/14/1922 (81 y.o. Female) Treating RN: Montey Hora Primary Care Physician: Caryl Bis, ERIC Other Clinician: Referring Physician: Caryl Bis, ERIC Treating Physician/Extender: Benjaman Pott in Treatment: 0 Visit Information Patient Arrived: Walker Arrival Time: 09:41 Transfer Assistance: None Patient Identification Verified: Yes Secondary Verification Process Completed: Yes Electronic Signature(s) Signed: 03/13/2015 5:24:25 PM By: Montey Hora Entered By: Montey Hora on 03/13/2015 09:42:22 Csaszar, Otis ([id number]) -------------------------------------------------------------------------------- Clinic Level of Care Assessment Details Patient Name: [name] Date of Service: 03/13/2015 9:30 AM Medical Record Number: [id number] Patient Account Number: [id number] Date of Birth/Sex: [birth year] (81 y.o. Female) Treating RN: Montey Hora Primary Care Physician: Caryl Bis, ERIC Other Clinician: Referring Physician: Caryl Bis, ERIC Treating  Physician/Extender: Benjaman Pott in Treatment: 0 Clinic Level of Care Assessment Items TOOL 2 Quantity Score []  - Use when only an EandM is performed on the INITIAL visit 0 ASSESSMENTS - Nursing Assessment / Reassessment X - General Physical Exam (combine w/ comprehensive assessment (listed just 1 20 below) when performed on new pt. evals) X - Comprehensive Assessment (HX, ROS, Risk Assessments, Wounds Hx, etc.) 1 25 ASSESSMENTS - Wound and Skin Assessment / Reassessment X - Simple Wound Assessment / Reassessment - one wound 1 5 []  - Complex Wound Assessment / Reassessment - multiple wounds 0 []  - Dermatologic / Skin Assessment (not related to wound area) 0 ASSESSMENTS - Ostomy and/or Continence Assessment and Care []  - Incontinence Assessment and Management 0 []  - Ostomy Care Assessment and Management (repouching, etc.) 0 PROCESS - Coordination of Care X - Simple Patient / Family Education for ongoing care 1 15 []  - Complex (extensive) Patient / Family Education for ongoing care 0 X - Staff obtains Programmer, systems, Records, Test Results / Process Orders 1 10 []  - Staff telephones HHA, Nursing Homes / Clarify orders / etc 0 []  - Routine Transfer to another Facility (non-emergent condition) 0 []  - Routine Hospital Admission (non-emergent condition) 0 X - New Admissions / Biomedical engineer / Ordering NPWT, Apligraf, etc. 1 15 []  - Emergency Hospital Admission (emergent condition) 0 X - Simple Discharge Coordination 1 10 Hosier, Ashaunti ([id number]) []  - Complex (extensive) Discharge Coordination 0 PROCESS - Special Needs []  - Pediatric / Minor Patient Management 0 []  - Isolation Patient Management 0 []  - Hearing / Language / Visual special needs 0 []  - Assessment of Community assistance (transportation, D/C planning, etc.) 0 []  - Additional assistance / Altered mentation 0 []  - Support Surface(s) Assessment (bed, cushion, seat, etc.) 0 INTERVENTIONS - Wound Cleansing /  Measurement X - Wound Imaging (photographs - any number of wounds) 1 5 []  - Wound Tracing (instead of photographs) 0 X - Simple Wound Measurement - one wound 1 5 []  - Complex Wound Measurement - multiple wounds 0 X - Simple Wound Cleansing - one wound 1 5 []  - Complex Wound Cleansing - multiple wounds 0 INTERVENTIONS - Wound  Dressings X - Small Wound Dressing one or multiple wounds 1 10 []  - Medium Wound Dressing one or multiple wounds 0 []  - Large Wound Dressing one or multiple wounds 0 []  - Application of Medications - injection 0 INTERVENTIONS - Miscellaneous []  - External ear exam 0 []  - Specimen Collection (cultures, biopsies, blood, body fluids, etc.) 0 []  - Specimen(s) / Culture(s) sent or taken to Lab for analysis 0 []  - Patient Transfer (multiple staff / Harrel Lemon Lift / Similar devices) 0 []  - Simple Staple / Suture removal (25 or less) 0 []  - Complex Staple / Suture removal (26 or more) 0 Pehrson, Alika ([id number]) []  - Hypo / Hyperglycemic Management (close monitor of Blood Glucose) 0 []  - Ankle / Brachial Index (ABI) - do not check if billed separately 0 Has the patient been seen at the hospital within the last three years: Yes Total Score: 125 Level Of Care: New/Established - Level 4 Electronic Signature(s) Signed: 03/13/2015 10:35:01 AM By: Montey Hora Entered By: Montey Hora on 03/13/2015 10:35:00 Vinje, Reita ([id number]) -------------------------------------------------------------------------------- Encounter Discharge Information Details Patient Name: [name] Date of Service: 03/13/2015 9:30 AM Medical Record Number: [id number] Patient Account Number: [id number] Date of Birth/Sex: 1923/02/26 (81 y.o. Female) Treating RN: Montey Hora Primary Care Physician: Caryl Bis, ERIC Other Clinician: Referring Physician: Caryl Bis, ERIC Treating Physician/Extender: Benjaman Pott in Treatment: 0 Encounter Discharge Information Items Discharge Pain Level:  0 Discharge Condition: Stable Ambulatory Status: Walker Discharge Destination: Home Private Transportation: Auto Accompanied By: dtr Schedule Follow-up Appointment: Yes Medication Reconciliation completed and No provided to Patient/Care Lamonta Cypress: Patient Clinical Summary of Care: Declined Electronic Signature(s) Signed: 03/13/2015 10:53:04 AM By: Ruthine Dose Previous Signature: 03/13/2015 10:35:49 AM Version By: Montey Hora Entered By: Ruthine Dose on 03/13/2015 10:53:03 Sickman, Jamera ([id number]) -------------------------------------------------------------------------------- Multi Wound Chart Details Patient Name: [name] Date of Service: 03/13/2015 9:30 AM Medical Record Number: [id number] Patient Account Number: [id number] Date of Birth/Sex: [birth year] (81 y.o. Female) Treating RN: Montey Hora Primary Care Physician: Caryl Bis, ERIC Other Clinician: Referring Physician: Caryl Bis, ERIC Treating Physician/Extender: Benjaman Pott in Treatment: 0 Vital Signs Height(in): 60 Pulse(bpm): 84 Weight(lbs): 114 Blood Pressure 174/74 (mmHg): Body Mass Index(BMI): 22 Temperature(F): 97.5 Respiratory Rate 16 (breaths/min): Photos: [1:No Photos] [N/A:N/A] Wound Location: [1:Left Gluteus] [N/A:N/A] Wounding Event: [1:Pressure Injury] [N/A:N/A] Primary Etiology: [1:Pressure Ulcer] [N/A:N/A] Comorbid History: [1:Hypertension, Osteoarthritis] [N/A:N/A] Date Acquired: [1:02/06/2015] [N/A:N/A] Weeks of Treatment: [1:0] [N/A:N/A] Wound Status: [1:Open] [N/A:N/A] Measurements L x W x D 0.6x0.8x0.1 [N/A:N/A] (cm) Area (cm) : [1:0.377] [N/A:N/A] Volume (cm) : [1:0.038] [N/A:N/A] % Reduction in Area: [1:0.00%] [N/A:N/A] % Reduction in Volume: 0.00% [N/A:N/A] Classification: [1:Category/Stage II] [N/A:N/A] Exudate Amount: [1:Small] [N/A:N/A] Exudate Type: [1:Serous] [N/A:N/A] Exudate Color: [1:amber] [N/A:N/A] Wound Margin: [1:Flat and Intact]  [N/A:N/A] Granulation Amount: [1:Large (67-100%)] [N/A:N/A] Granulation Quality: [1:Red] [N/A:N/A] Necrotic Amount: [1:None Present (0%)] [N/A:N/A] Exposed Structures: [1:Fascia: No Fat: No Tendon: No Muscle: No Joint: No Bone: No] [N/A:N/A] Limited to Skin Breakdown Epithelialization: None N/A N/A Periwound Skin Texture: Edema: No N/A N/A Excoriation: No Induration: No Callus: No Crepitus: No Fluctuance: No Friable: No Rash: No Scarring: No Periwound Skin Maceration: No N/A N/A Moisture: Moist: No Dry/Scaly: No Periwound Skin Color: Atrophie Blanche: No N/A N/A Cyanosis: No Ecchymosis: No Erythema: No Hemosiderin Staining: No Mottled: No Pallor: No Rubor: No Temperature: No Abnormality N/A N/A Tenderness on No N/A N/A Palpation: Wound Preparation: Ulcer Cleansing: N/A N/A Rinsed/Irrigated with Saline Topical Anesthetic Applied: None Treatment Notes Electronic Signature(s) Signed: 03/13/2015 5:24:25 PM By: Marjory Lies,  Di Kindle Entered By: Montey Hora on 03/13/2015 10:05:09 Cottam, Tonye ([id number]) -------------------------------------------------------------------------------- Multi-Disciplinary Care Plan Details Patient Name: [name] Date of Service: 03/13/2015 9:30 AM Medical Record Number: [id number] Patient Account Number: [id number] Date of Birth/Sex: 11-27-1922 (81 y.o. Female) Treating RN: Montey Hora Primary Care Physician: Caryl Bis, ERIC Other Clinician: Referring Physician: Caryl Bis, ERIC Treating Physician/Extender: Benjaman Pott in Treatment: 0 Active Inactive Electronic Signature(s) Signed: 04/03/2015 5:27:16 PM By: Gretta Cool RN, BSN, Kim RN, BSN Signed: 04/06/2015 5:32:28 PM By: Montey Hora Previous Signature: 03/13/2015 5:24:25 PM Version By: Montey Hora Entered By: Gretta Cool RN, BSN, Kim on 04/03/2015 10:42:50 [name], Camillia  ([id number]) -------------------------------------------------------------------------------- Patient/Caregiver Education Details Patient Name: [name] Date of Service: 03/13/2015 9:30 AM Medical Record Number: [id number] Patient Account Number: [id number] Date of Birth/Gender: 07/29/1922 (81 y.o. Female) Treating RN: Montey Hora Primary Care Physician: Caryl Bis, ERIC Other Clinician: Referring Physician: Caryl Bis, ERIC Treating Physician/Extender: Benjaman Pott in Treatment: 0 Education Assessment Education Provided To: Patient and Caregiver Education Topics Provided Pressure: Handouts: Other: pressure relief measures Methods: Explain/Verbal Responses: State content correctly Wound/Skin Impairment: Handouts: Other: wound care as ordered Methods: Demonstration, Explain/Verbal Responses: State content correctly Electronic Signature(s) Signed: 03/13/2015 5:36:04 PM By: Judene Companion MD Previous Signature: 03/13/2015 10:34:29 AM Version By: Montey Hora Entered By: Judene Companion on 03/13/2015 10:47:05 Begay, Hailyn ([id number]) -------------------------------------------------------------------------------- Wound Assessment Details Patient Name: [name] Date of Service: 03/13/2015 9:30 AM Medical Record Number: [id number] Patient Account Number: [id number] Date of Birth/Sex: [birth year] (81 y.o. Female) Treating RN: Montey Hora Primary Care Physician: Caryl Bis, ERIC Other Clinician: Referring Physician: Caryl Bis, ERIC Treating Physician/Extender: Judene Companion Weeks in Treatment: 0 Wound Status Wound Number: 1 Primary Etiology: Pressure Ulcer Wound Location: Left Gluteus Wound Status: Open Wounding Event: Pressure Injury Comorbid History: Hypertension, Osteoarthritis Date Acquired: 02/06/2015 Weeks Of Treatment: 0 Clustered Wound: No Photos Photo Uploaded By: Montey Hora on 03/13/2015 10:43:02 Wound Measurements Length: (cm) 0.6 Width:  (cm) 0.8 Depth: (cm) 0.1 Area: (cm) 0.377 Volume: (cm) 0.038 % Reduction in Area: 0% % Reduction in Volume: 0% Epithelialization: None Tunneling: No Undermining: No Wound Description Classification: Category/Stage II Wound Margin: Flat and Intact Exudate Amount: Small Exudate Type: Serous Exudate Color: amber Foul Odor After Cleansing: No Wound Bed Granulation Amount: Large (67-100%) Exposed Structure Granulation Quality: Red Fascia Exposed: No Necrotic Amount: None Present (0%) Fat Layer Exposed: No Tendon Exposed: No Maultsby, Daniyla ([id number]) Muscle Exposed: No Joint Exposed: No Bone Exposed: No Limited to Skin Breakdown Periwound Skin Texture Texture Color No Abnormalities Noted: No No Abnormalities Noted: No Callus: No Atrophie Blanche: No Crepitus: No Cyanosis: No Excoriation: No Ecchymosis: No Fluctuance: No Erythema: No Friable: No Hemosiderin Staining: No Induration: No Mottled: No Localized Edema: No Pallor: No Rash: No Rubor: No Scarring: No Temperature / Pain Moisture Temperature: No Abnormality No Abnormalities Noted: No Dry / Scaly: No Maceration: No Moist: No Wound Preparation Ulcer Cleansing: Rinsed/Irrigated with Saline Topical Anesthetic Applied: None Electronic Signature(s) Signed: 03/13/2015 5:24:25 PM By: Montey Hora Entered By: Montey Hora on 03/13/2015 10:03:40 Polidore, Aviella ([id number]) -------------------------------------------------------------------------------- Vitals Details Patient Name: [name] Date of Service: 03/13/2015 9:30 AM Medical Record Number: [id number] Patient Account Number: [id number] Date of Birth/Sex: April 13, 1923 (81 y.o. Female) Treating RN: Montey Hora Primary Care Physician: Caryl Bis, ERIC Other Clinician: Referring Physician: Caryl Bis, ERIC Treating Physician/Extender: Benjaman Pott in Treatment: 0 Vital Signs Time Taken: 09:42 Temperature (F): 97.5 Height (in):  60 Pulse (bpm): 84 Source: Stated Respiratory Rate (breaths/min): 16 Weight (lbs): 114 Blood Pressure (mmHg): 174/74  Source: Stated Reference Range: 80 - 120 mg / dl Body Mass Index (BMI): 22.3 Electronic Signature(s) Signed: 03/13/2015 5:24:25 PM By: Montey Hora Entered By: Montey Hora on 03/13/2015 09:43:56

## 2015-03-14 NOTE — Progress Notes (Addendum)
[name] ([id number]) Visit Report for 03/13/2015 Chief Complaint Document Details Patient Name: [name], [name] [facility], [place] 03/13/2015 9:30 Date of Service: AM Medical Record [id number] Number: Patient Account Number: [id number] 1923-02-06 (81 y.o. Treating RN: Montey Hora Date of Birth/Sex: Female) Other Clinician: Primary Care Physician: Caryl Bis, ERIC Treating Jerline Pain, Katheleen Stella Referring Physician: Caryl Bis, ERIC Physician/Extender: Suella Grove in Treatment: 0 Information Obtained from: Patient Electronic Signature(s) Signed: 03/13/2015 5:36:04 PM By: Judene Companion MD Entered By: Judene Companion on 03/13/2015 17:30:03 [place], [place] ([id number]) -------------------------------------------------------------------------------- HPI Details Patient Name: [name], [name] 03/13/2015 9:30 Date of Service: AM Medical Record [id number] Number: Patient Account Number: [id number] March 02, 1923 (81 y.o. Treating RN: Montey Hora Date of Birth/Sex: Female) Other Clinician: Primary Care Physician: Caryl Bis, ERIC Treating Jerline Pain, Antino Mayabb Referring Physician: Caryl Bis, ERIC Physician/Extender: Suella Grove in Treatment: 0 Electronic Signature(s) Signed: 03/13/2015 5:36:04 PM By: Judene Companion MD Entered By: Judene Companion on 03/13/2015 10:25:07 Germain, Shawnda ([id number]) -------------------------------------------------------------------------------- Physical Exam Details Patient Name: [name], [name] 03/13/2015 9:30 Date of Service: AM Medical Record [id number] Number: Patient Account Number: [id number] 1922/09/17 (81 y.o. Treating RN: Montey Hora Date of Birth/Sex: Female) Other Clinician: Primary Care Physician: Caryl Bis, ERIC Treating Jerline Pain, Yousuf Ager Referring Physician: Caryl Bis, ERIC Physician/Extender: Suella Grove in Treatment: 0 Electronic Signature(s) Signed: 03/13/2015 5:36:04 PM By: Judene Companion MD Entered By: Judene Companion on 03/13/2015 10:25:16 [name], [name]  ([id number]) -------------------------------------------------------------------------------- Physician Orders Details Patient Name: [name], [name] 03/13/2015 9:30 Date of Service: AM Medical Record [id number] Number: Patient Account Number: [id number] June 30, 1922 (81 y.o. Treating RN: Montey Hora Date of Birth/Sex: Female) Other Clinician: Primary Care Physician: Caryl Bis, ERIC Treating Jerline Pain, Emmanuel Ercole Referring Physician: Caryl Bis, ERIC Physician/Extender: Suella Grove in Treatment: 0 Verbal / Phone Orders: Yes Clinician: Montey Hora Read Back and Verified: Yes Diagnosis Coding Wound Cleansing Wound #1 Left Gluteus o Clean wound with Normal Saline. Skin Barriers/Peri-Wound Care Wound #1 Left Gluteus o Skin Prep Primary Wound Dressing Wound #1 Left Gluteus o Prisma Ag - collagen with silver Secondary Dressing Wound #1 Left Gluteus o Boardered Foam Dressing Dressing Change Frequency Wound #1 Left Gluteus o Change dressing every other day. Follow-up Appointments Wound #1 Left Gluteus o Return Appointment in 1 week. Home Health Wound #1 Left [employer] Visits - [place] Nurse may visit PRN to address patientos wound care needs. o FACE TO FACE ENCOUNTER: MEDICARE and MEDICAID PATIENTS: I certify that this patient is under my care and that I had a face-to-face encounter that meets the physician face-to-face encounter requirements with this patient on this date. The encounter with the patient was in whole or in part for the following MEDICAL CONDITION: (primary reason for [place]) MEDICAL NECESSITY: I certify, that based on my findings, NURSING services are a medically [name], [name] ([id number]) necessary home health service. HOME BOUND STATUS: I certify that my clinical findings support that this patient is homebound (i.e., Due to illness or injury, pt requires aid of supportive devices such as crutches, cane,  wheelchairs, walkers, the use of special transportation or the assistance of another person to leave their place of residence. There is a normal inability to leave the home and doing so requires considerable and taxing effort. Other absences are for medical reasons / religious services and are infrequent or of short duration when for other reasons). o If current dressing causes regression in wound condition, may D/C ordered dressing product/s and apply Normal Saline Moist Dressing daily until next [place] / Other MD appointment. [place] of regression in wound condition at [phone].   o Please direct any NON-WOUND related issues/requests for orders to patient's Primary Care Physician Electronic Signature(s) Signed: 03/14/2015 9:47:49 AM By: Judene Companion MD Previous Signature: 03/13/2015 5:24:25 PM Version By: Montey Hora Entered By: Judene Companion on 03/14/2015 09:47:49 [name], [name] ([id number]) -------------------------------------------------------------------------------- Problem List Details Patient Name: [name], [name] 03/13/2015 9:30 Date of Service: AM Medical Record [id number] Number: Patient Account Number: [id number] 01/08/1923 (81 y.o. Treating RN: Montey Hora Date of Birth/Sex: Female) Other Clinician: Primary Care Physician: Caryl Bis, ERIC Treating Jerline Pain, Sheronica Corey Referring Physician: Caryl Bis, ERIC Physician/Extender: Suella Grove in Treatment: 0 Active Problems ICD-10 Encounter Code Description Active Date Diagnosis L89.311 Pressure ulcer of right buttock, stage 1 03/13/2015 Yes Inactive Problems Resolved Problems Electronic Signature(s) Signed: 03/13/2015 5:36:04 PM By: Judene Companion MD Entered By: Judene Companion on 03/13/2015 17:31:30 [name], [name] ([id number]) -------------------------------------------------------------------------------- Progress Note Details Patient Name: [name], [name] 03/13/2015 9:30 Date of  Service: AM Medical Record [id number] Number: Patient Account Number: [id number] 11-20-1922 (81 y.o. Treating RN: Montey Hora Date of Birth/Sex: Female) Other Clinician: Primary Care Physician: Caryl Bis, ERIC Treating Jerline Pain, Jackston [place] Referring Physician: Caryl Bis, ERIC Physician/Extender: Suella Grove in Treatment: 0 Subjective Chief Complaint Information obtained from Patient Wound History Patient presents with 1 open wound that has been present for approximately about 1 month. Patient has been treating wound in the following manner: barrier cream. Laboratory tests have not been performed in the last month. Patient reportedly has not tested positive for an antibiotic resistant organism. Patient reportedly has not tested positive for osteomyelitis. Patient reportedly has not had testing performed to evaluate circulation in the legs. Patient History Information obtained from Caregiver. Allergies penicillin, atenolol, Dilaudid Family History Cancer - Siblings, Heart Disease - Siblings, Hypertension - Siblings, No family history of Diabetes, Hereditary Spherocytosis, Kidney Disease, Lung Disease, Seizures, Stroke, Thyroid Problems, Tuberculosis. Social History Never smoker, Marital Status - Widowed, Alcohol Use - Never, Drug Use - No History, Caffeine Use - Never. Medical History Cardiovascular Patient has history of Hypertension Musculoskeletal Patient has history of Osteoarthritis - back and knees Oncologic Denies history of Received Chemotherapy, Received Radiation Hospitalization/Surgery History - 02/02/2015, [facility], [name], [place] ([id number]) Medical And Surgical History Notes Ear/Nose/Mouth/Throat HOH - bilateral hearing aids Hematologic/Lymphatic low platelets Review of Systems (ROS) Constitutional Symptoms (General Health) The patient has no complaints or symptoms. Eyes The patient has no complaints or symptoms. Ear/Nose/Mouth/Throat The patient has no complaints or  symptoms. Hematologic/Lymphatic The patient has no complaints or symptoms. Respiratory The patient has no complaints or symptoms. Gastrointestinal The patient has no complaints or symptoms. Endocrine The patient has no complaints or symptoms. Genitourinary Complains or has symptoms of Incontinence/dribbling - foley catheter. Immunological The patient has no complaints or symptoms. Integumentary (Skin) The patient has no complaints or symptoms. Musculoskeletal The patient has no complaints or symptoms. Neurologic The patient has no complaints or symptoms. Oncologic The patient has no complaints or symptoms. Psychiatric The patient has no complaints or symptoms. Objective Constitutional Vitals Time Taken: 9:42 AM, Height: 60 in, Source: Stated, Weight: 114 lbs, Source: Stated, BMI: 22.3, Temperature: 97.5 F, Pulse: 84 bpm, Respiratory Rate: 16 breaths/min, Blood Pressure: 174/74 mmHg. Integumentary (Hair, Skin) Wound #1 status is Open. Original cause of wound was Pressure Injury. The wound is located on the Left Dial, Caya ([id number]) Gluteus. The wound measures 0.6cm length x 0.8cm width x 0.1cm depth; 0.377cm^2 area and 0.038cm^3 volume. The wound is limited to skin breakdown. There is no tunneling or undermining noted. There is a small amount of serous drainage noted. The wound margin is  flat and intact. There is large (67-100%) red granulation within the wound bed. There is no necrotic tissue within the wound bed. The periwound skin appearance did not exhibit: Callus, Crepitus, Excoriation, Fluctuance, Friable, Induration, Localized Edema, Rash, Scarring, Dry/Scaly, Maceration, Moist, Atrophie Blanche, Cyanosis, Ecchymosis, Hemosiderin Staining, Mottled, Pallor, Rubor, Erythema. Periwound temperature was noted as No Abnormality. Assessment Plan Wound Cleansing: Wound #1 Left Gluteus: Clean wound with Normal Saline. Skin Barriers/Peri-Wound Care: Wound #1 Left  Gluteus: Skin Prep Primary Wound Dressing: Wound #1 Left Gluteus: Prisma Ag - collagen with silver Secondary Dressing: Wound #1 Left Gluteus: Boardered Foam Dressing Dressing Change Frequency: Wound #1 Left Gluteus: Change dressing every other day. Follow-up Appointments: Wound #1 Left Gluteus: Return Appointment in 1 week. Home Health: Wound #1 Left Gluteus: Continue Home Health Visits - [place] Nurse may visit PRN to address patient s wound care needs. FACE TO FACE ENCOUNTER: MEDICARE and MEDICAID PATIENTS: I certify that this patient is under my care and that I had a face-to-face encounter that meets the physician face-to-face encounter requirements with this patient on this date. The encounter with the patient was in whole or in part for the following MEDICAL CONDITION: (primary reason for [place]) MEDICAL NECESSITY: I certify, that based on my findings, NURSING services are a medically necessary home health service. HOME BOUND STATUS: I certify that my clinical findings support that this patient is homebound (i.e., Due to [facility], [place] ([id number]) illness or injury, pt requires aid of supportive devices such as crutches, cane, wheelchairs, walkers, the use of special transportation or the assistance of another person to leave their place of residence. There is a normal inability to leave the home and doing so requires considerable and taxing effort. Other absences are for medical reasons / religious services and are infrequent or of short duration when for other reasons). If current dressing causes regression in wound condition, may D/C ordered dressing product/s and apply Normal Saline Moist Dressing daily until next [place] / Other MD appointment. [place] of regression in wound condition at [phone]. Please direct any NON-WOUND related issues/requests for orders to patient's Primary Care Physician Patient has 8 mm  pressure ulcer left buttuck. .Will treat with collagen and off loading dressing Electronic Signature(s) Signed: 03/13/2015 5:36:04 PM By: Judene Companion MD Entered By: Judene Companion on 03/13/2015 10:26:56 [name], [name] ([id number]) -------------------------------------------------------------------------------- ROS/PFSH Details Patient Name: [name], [name] 03/13/2015 9:30 Date of Service: AM Medical Record [id number] Number: Patient Account Number: [id number] [birth year] (81 y.o. Treating RN: Montey Hora Date of Birth/Sex: Female) Other Clinician: Primary Care Physician: Caryl Bis, ERIC Treating Jerline Pain, Jerimyah V[place]unk Referring Physician: Caryl Bis, ERIC Physician/Extender: Suella Grove in Treatment: 0 Information Obtained From Caregiver Wound History Do you currently have one or more open woundso Yes How many open wounds do you currently haveo 1 Approximately how long have you had your woundso about 1 month How have you been treating your wound(s) until nowo barrier cream Has your wound(s) ever healed and then re-openedo No Have you had any lab work done in the past montho No Have you tested positive for an antibiotic resistant organism (MRSA, VRE)o No Have you tested positive for osteomyelitis (bone infection)o No Have you had any tests for circulation on your legso No Genitourinary Complaints and Symptoms: Positive for: Incontinence/dribbling - foley catheter Constitutional Symptoms (General Health) Complaints and Symptoms: No Complaints or Symptoms Eyes Complaints and Symptoms: No Complaints or Symptoms Ear/Nose/Mouth/Throat Complaints and Symptoms: No Complaints or Symptoms Medical History: Past Medical  History Notes: HOH - bilateral hearing aids Hematologic/Lymphatic Complaints and Symptoms: No Complaints or Symptoms [name], [name] ([id number]) Medical History: Past Medical History Notes: low platelets Respiratory Complaints and Symptoms: No Complaints or  Symptoms Cardiovascular Medical History: Positive for: Hypertension Gastrointestinal Complaints and Symptoms: No Complaints or Symptoms Endocrine Complaints and Symptoms: No Complaints or Symptoms Immunological Complaints and Symptoms: No Complaints or Symptoms Integumentary (Skin) Complaints and Symptoms: No Complaints or Symptoms Musculoskeletal Complaints and Symptoms: No Complaints or Symptoms Medical History: Positive for: Osteoarthritis - back and knees Neurologic Complaints and Symptoms: No Complaints or Symptoms Oncologic Complaints and Symptoms: No Complaints or Symptoms [name], [place] ([id number]) Medical History: Negative for: Received Chemotherapy; Received Radiation Psychiatric Complaints and Symptoms: No Complaints or Symptoms Hospitalization / Surgery History Name of Hospital Purpose of Hospitalization/Surgery Date [facility] 02/02/2015 Family and Social History Cancer: Yes - Siblings; Diabetes: No; Heart Disease: Yes - Siblings; Hereditary Spherocytosis: No; Hypertension: Yes - Siblings; Kidney Disease: No; Lung Disease: No; Seizures: No; Stroke: No; Thyroid Problems: No; Tuberculosis: No; Never smoker; Marital Status - Widowed; Alcohol Use: Never; Drug Use: No History; Caffeine Use: Never; Financial Concerns: No; Food, Clothing or Shelter Needs: No; Support System Lacking: No; Transportation Concerns: No; Advanced Directives: Yes (Copy provided); Living Will: No; Medical Power of Attorney: Yes - [place] Scheidegger (Copy provided) Electronic Signature(s) Signed: 03/13/2015 5:24:25 PM By: Montey Hora Entered By: Montey Hora on 03/13/2015 09:51:02 Finnie, [place] ([id number]) -------------------------------------------------------------------------------- SuperBill Details Patient Name: Chauncey Mann Date of Service: 03/13/2015 Medical Record Number: [id number] Patient Account Number: [id number] Date of Birth/Sex: 1923/05/07 (81 y.o. Female) Treating RN: Montey Hora Primary Care Physician: Caryl Bis, ERIC Other Clinician: Referring Physician: Caryl Bis, ERIC Treating Physician/Extender: Benjaman Pott in Treatment: 0 Diagnosis Coding ICD-10 Codes Code Description P53.614 Pressure ulcer of right buttock, stage 1 Facility Procedures CPT4 Code: 43154008 Description: 99214 - WOUND CARE VISIT-LEV 4 EST PT Modifier: Quantity: 1 Physician Procedures CPT4 Code: 6761950 Description: 93267 - WC PHYS LEVEL 1 - NEW PT ICD-10 Description Diagnosis L89.311 Pressure ulcer of right buttock, stage 1 Modifier: Quantity: 1 Electronic Signature(s) Signed: 03/13/2015 5:03:42 PM By: Montey Hora Entered By: Montey Hora on 03/13/2015 17:03:42

## 2015-03-15 DIAGNOSIS — L89322 Pressure ulcer of left buttock, stage 2: Secondary | ICD-10-CM | POA: Diagnosis not present

## 2015-03-15 DIAGNOSIS — I10 Essential (primary) hypertension: Secondary | ICD-10-CM | POA: Diagnosis not present

## 2015-03-15 DIAGNOSIS — D649 Anemia, unspecified: Secondary | ICD-10-CM | POA: Diagnosis not present

## 2015-03-15 DIAGNOSIS — M8448XD Pathological fracture, other site, subsequent encounter for fracture with routine healing: Secondary | ICD-10-CM | POA: Diagnosis not present

## 2015-03-15 DIAGNOSIS — N390 Urinary tract infection, site not specified: Secondary | ICD-10-CM | POA: Diagnosis not present

## 2015-03-15 DIAGNOSIS — B9620 Unspecified Escherichia coli [E. coli] as the cause of diseases classified elsewhere: Secondary | ICD-10-CM | POA: Diagnosis not present

## 2015-03-17 ENCOUNTER — Encounter: Payer: Self-pay | Admitting: Family Medicine

## 2015-03-17 ENCOUNTER — Inpatient Hospital Stay: Payer: Medicare Other

## 2015-03-17 ENCOUNTER — Telehealth: Payer: Self-pay | Admitting: Urology

## 2015-03-17 ENCOUNTER — Ambulatory Visit (INDEPENDENT_AMBULATORY_CARE_PROVIDER_SITE_OTHER): Payer: Medicare Other | Admitting: Family Medicine

## 2015-03-17 VITALS — BP 130/82 | HR 82 | Temp 98.7°F | Ht 60.0 in | Wt 115.0 lb

## 2015-03-17 DIAGNOSIS — E871 Hypo-osmolality and hyponatremia: Secondary | ICD-10-CM | POA: Diagnosis not present

## 2015-03-17 DIAGNOSIS — E538 Deficiency of other specified B group vitamins: Secondary | ICD-10-CM | POA: Diagnosis not present

## 2015-03-17 DIAGNOSIS — D696 Thrombocytopenia, unspecified: Secondary | ICD-10-CM | POA: Diagnosis not present

## 2015-03-17 DIAGNOSIS — B372 Candidiasis of skin and nail: Secondary | ICD-10-CM | POA: Diagnosis not present

## 2015-03-17 DIAGNOSIS — I4891 Unspecified atrial fibrillation: Secondary | ICD-10-CM | POA: Diagnosis not present

## 2015-03-17 DIAGNOSIS — N898 Other specified noninflammatory disorders of vagina: Secondary | ICD-10-CM

## 2015-03-17 DIAGNOSIS — R8290 Unspecified abnormal findings in urine: Secondary | ICD-10-CM

## 2015-03-17 DIAGNOSIS — D72821 Monocytosis (symptomatic): Secondary | ICD-10-CM | POA: Diagnosis not present

## 2015-03-17 DIAGNOSIS — I10 Essential (primary) hypertension: Secondary | ICD-10-CM | POA: Diagnosis not present

## 2015-03-17 DIAGNOSIS — E785 Hyperlipidemia, unspecified: Secondary | ICD-10-CM | POA: Diagnosis not present

## 2015-03-17 DIAGNOSIS — R8299 Other abnormal findings in urine: Secondary | ICD-10-CM | POA: Diagnosis not present

## 2015-03-17 LAB — POCT URINALYSIS DIPSTICK
Glucose, UA: NEGATIVE
Ketones, UA: NEGATIVE
NITRITE UA: POSITIVE
Spec Grav, UA: 1.01
Urobilinogen, UA: 0.2
pH, UA: 7

## 2015-03-17 LAB — URINALYSIS, MICROSCOPIC ONLY

## 2015-03-17 LAB — BASIC METABOLIC PANEL
BUN: 9 mg/dL (ref 6–23)
CALCIUM: 9 mg/dL (ref 8.4–10.5)
CO2: 23 mEq/L (ref 19–32)
CREATININE: 1.03 mg/dL (ref 0.40–1.20)
Chloride: 101 mEq/L (ref 96–112)
GFR: 53.42 mL/min — AB (ref >60.00)
Glucose, Bld: 106 mg/dL — ABNORMAL HIGH (ref 70–99)
Potassium: 4.3 mEq/L (ref 3.5–5.1)
SODIUM: 133 meq/L — AB (ref 135–145)

## 2015-03-17 MED ORDER — CYANOCOBALAMIN 1000 MCG/ML IJ SOLN
1000.0000 ug | Freq: Once | INTRAMUSCULAR | Status: AC
  Administered 2015-03-17: 1000 ug via INTRAMUSCULAR
  Filled 2015-03-17: qty 1

## 2015-03-17 MED ORDER — NYSTATIN 100000 UNIT/GM EX CREA: 1.0000 "application " | TOPICAL_CREAM | Freq: Two times a day (BID) | CUTANEOUS | Status: DC

## 2015-03-17 MED ORDER — CEFDINIR 300 MG PO CAPS
300.0000 mg | ORAL_CAPSULE | Freq: Two times a day (BID) | ORAL | Status: DC
Start: 2015-03-17 — End: 2015-04-06

## 2015-03-17 MED ORDER — ZINC OXIDE 10 % EX CREA: 1.0000 "application " | TOPICAL_CREAM | Freq: Two times a day (BID) | CUTANEOUS | Status: DC

## 2015-03-17 NOTE — Telephone Encounter (Signed)
Pt's daughter, Rosann Auerbach, called.  Pt was seen in Dr. Ellen Henri office today regarding possible UTI.  Dr. Caryl Bis instructed the daughter to call to schedule an appt with [place] for next week.  Told daughter that pt already has appt scheduled for 11/3 but she stated that Dr. Caryl Bis wanted an appt next week because "my mother has a UTI".  I scheduled an appt for Monday 10/17.  Dr. Caryl Bis also requested that a nurse come out to change their cath next week.  Please return the daughter's call.

## 2015-03-17 NOTE — Progress Notes (Signed)
Pre visit review using our clinic review tool, if applicable. No additional management support is needed unless otherwise documented below in the visit note. 

## 2015-03-17 NOTE — Patient Instructions (Addendum)
Nice to see you. We will treat your rash with nystatin. Once the rash is gone you can use the zinc oxide barrier cream to help prevent irritation. We will await the results of the wet prep prior to starting oral medication.  Your urine appears to be infected. You need to have your catheter changed. We will treat you with omnicef.  If you develop rash, throat swelling, tongue swelling, or trouble breathing please seek medical attention.  If you develop fever, abdominal pain, blood in your urine, increasing rash, vaginal discharge, or feel poorly please seek medical attention.

## 2015-03-17 NOTE — Assessment & Plan Note (Signed)
Patient has had recurrence of candidal intertrigo. We will treat with nystatin. There is no warmth or induration to indicate cellulitis. She additionally reports small amount of vaginal discharge. We will send for wet prep. We will await wet prep results prior to treating with Diflucan. Will additionally advised patient to use barrier cream after rash is gone to help prevent irritation. Given return precautions

## 2015-03-17 NOTE — Progress Notes (Signed)
Patient ID: [name], female   DOB: December 28, 1922, 81 y.o.   MRN: [id number]  Tommi Rumps, MD Phone: [phone]  [name] is a 81 y.o. female who presents today for same-day visit.  Urine odor: Patient presents with foul odor to her urine. She has she has a Foley catheter in place which was changed at the end of September when she was at urology. She notes no change in color to the urine. She does not feel ill. She has had no fevers. She has no abdominal pain. She has no hematuria. She has a catheter in place to neurogenic bladder. Recently she has been hospitalized twice for sepsis related to pyelonephritis.  Candidal intertrigo: Patient had this at her last office visit. She is prescribed nystatin. They used this and the rash improved. They've not been using this for some time. The rash has returned. Now they note mild white vaginal discharge. Patient is not sexually active. She has no abdominal or pelvic pain. There is no drainage from the rash. No vaginal bleeding.  PMH: nonsmoker   ROS see history of present illness  Objective  Physical Exam Filed Vitals:   03/17/15 1311  BP: 130/82  Pulse: 82  Temp: 98.7 F (37.1 C)    Physical Exam  Constitutional: She is well-developed, well-nourished, and in no distress.  HENT:  Head: Normocephalic and atraumatic.  Cardiovascular: Normal rate, regular rhythm and normal heart sounds.  Exam reveals no gallop and no friction rub.   No murmur heard. Pulmonary/Chest: Effort normal and breath sounds normal. No respiratory distress. She has no wheezes. She has no rales.  Abdominal: Soft. Bowel sounds are normal. She exhibits no distension and no mass. There is no tenderness. There is no rebound and no guarding.  Genitourinary:  Lateral external labia and inguinal crease with minimal erythematous rash with several satellite lesions, much improved from last visit, atrophic vaginal tissue, normal cervix, no discharge, no bleeding, no  cervical motion tenderness, no adnexal mass or tenderness on bimanual exam  Neurological: She is alert.  Skin: Skin is warm and dry. She is not diaphoretic.     Assessment/Plan: Please see individual problem list.  Candidal intertrigo, inguinal Patient has had recurrence of candidal intertrigo. We will treat with nystatin. There is no warmth or induration to indicate cellulitis. She additionally reports small amount of vaginal discharge. We will send for wet prep. We will await wet prep results prior to treating with Diflucan. Will additionally advised patient to use barrier cream after rash is gone to help prevent irritation. Given return precautions  Bad odor of urine Patient feels well and is afebrile. Well-appearing. Vital signs are stable. Benign abdominal exam. UA with large leukocytes, positive nitrites, and small blood. Consistent with urine infection. We'll send urine for culture and microscopy. Discussed management options with patient and daughter who is her caregiver and power of attorney. Options include waiting for culture to return prior to treatment versus treatment with antibiotics. Patient daughter opted for antibiotic treatment at this time. Patient has an allergy to penicillins that is limited to rash. She did not have any mouth or throat symptoms or shortness of breath with penicillin use in the past. Patient's age and GFR precludes the use of Macrobid. Patient's last 3 urine cultures have been resistant to Cipro. Patient currently is being followed by oncology for diminished platelets and anemia that could've been contributed to by prior use of Bactrim. I had a long discussion with the patient and her caregiver  regarding which antibiotic would be best for the patient. Given the prior information that precludes the use of penicillins, Macrobid, Bactrim, and Cipro there are limited medications that we can use. Discussed that a third generation cephalosporin has limited  cross-reactivity with penicillins though there is a risk of cross-reactivity and allergic reaction with use of third-generation cephalosporin in someone who has a penicillin allergy. Also discussed fosfomycin as possible medication for this issue though noted that this medication could be relatively expensive. Patient and caregiver opted for treatment with Omnicef and recognized the potential for allergic reaction, though this is small in third-generation cephalosporins. We'll treat with Omnicef for 7 days. Creatinine clearance of 35 so no dose adjustment needed per review of up-to-date drug information. Prescription given for home health to remove patient's Foley catheter and replace with a new Foley catheter . Patient will follow-up with urology next week. Given return precautions.    Orders Placed This Encounter  Procedures  . WET PREP FOR [place], YEAST, CLUE  . Urine Culture  . Urine Microscopic Only  . POCT Urinalysis Dipstick    Meds ordered this encounter  Medications  . nystatin cream (MYCOSTATIN)    Sig: Apply 1 application topically 2 (two) times daily.    Dispense:  30 g    Refill:  0  . ZINC OXIDE, TOPICAL, 10 % CREA    Sig: Apply 1 application topically 2 (two) times daily. To affected area    Dispense:  113 g    Refill:  3  . cefdinir (OMNICEF) 300 MG capsule    Sig: Take 1 capsule (300 mg total) by mouth 2 (two) times daily.    Dispense:  14 capsule    Refill:  0     Tommi Rumps

## 2015-03-17 NOTE — Assessment & Plan Note (Addendum)
Patient feels well and is afebrile. Well-appearing. Vital signs are stable. Benign abdominal exam. UA with large leukocytes, positive nitrites, and small blood. Consistent with urine infection. We'll send urine for culture and microscopy. Discussed management options with patient and daughter who is her caregiver and power of attorney. Options include waiting for culture to return prior to treatment versus treatment with antibiotics. Patient daughter opted for antibiotic treatment at this time. Patient has an allergy to penicillins that is limited to rash. She did not have any mouth or throat symptoms or shortness of breath with penicillin use in the past. Patient's age and GFR precludes the use of Macrobid. Patient's last 3 urine cultures have been resistant to Cipro. Patient currently is being followed by oncology for diminished platelets and anemia that could've been contributed to by prior use of Bactrim. I had a long discussion with the patient and her caregiver regarding which antibiotic would be best for the patient. Given the prior information that precludes the use of penicillins, Macrobid, Bactrim, and Cipro there are limited medications that we can use. Discussed that a third generation cephalosporin has limited cross-reactivity with penicillins though there is a risk of cross-reactivity and allergic reaction with use of third-generation cephalosporin in someone who has a penicillin allergy. Also discussed fosfomycin as possible medication for this issue though noted that this medication could be relatively expensive. Patient and caregiver opted for treatment with Omnicef and recognized the potential for allergic reaction, though this is small in third-generation cephalosporins. We'll treat with Omnicef for 7 days. Creatinine clearance of 35 so no dose adjustment needed per review of up-to-date drug information. Prescription given for home health to remove patient's Foley catheter and replace with a new  Foley catheter . Patient will follow-up with urology next week. Given return precautions.

## 2015-03-17 NOTE — Telephone Encounter (Signed)
Spoke with Rosann Auerbach, pt daughter, in reference appt 03/20/15. Nurse made [facility] aware of Dr. Ellen Henri note from today and made her aware without ucx results and her currently being on an abx there would not be much more we could do on Monday. Reinforced the need of keeping 04/06/15 appt and if she develops any problems to call our office. Rosann Auerbach voiced understanding.

## 2015-03-19 DIAGNOSIS — I10 Essential (primary) hypertension: Secondary | ICD-10-CM | POA: Diagnosis not present

## 2015-03-19 DIAGNOSIS — M8448XD Pathological fracture, other site, subsequent encounter for fracture with routine healing: Secondary | ICD-10-CM | POA: Diagnosis not present

## 2015-03-19 DIAGNOSIS — N390 Urinary tract infection, site not specified: Secondary | ICD-10-CM | POA: Diagnosis not present

## 2015-03-19 DIAGNOSIS — B9620 Unspecified Escherichia coli [E. coli] as the cause of diseases classified elsewhere: Secondary | ICD-10-CM | POA: Diagnosis not present

## 2015-03-19 DIAGNOSIS — D649 Anemia, unspecified: Secondary | ICD-10-CM | POA: Diagnosis not present

## 2015-03-19 DIAGNOSIS — L89322 Pressure ulcer of left buttock, stage 2: Secondary | ICD-10-CM | POA: Diagnosis not present

## 2015-03-20 ENCOUNTER — Ambulatory Visit: Payer: Medicare Other

## 2015-03-20 LAB — URINE CULTURE: Colony Count: >=100000

## 2015-03-20 LAB — WET PREP BY MOLECULAR PROBE
CANDIDA SPECIES: NEGATIVE
GARDNERELLA VAGINALIS: NEGATIVE
TRICHOMONAS VAG: NEGATIVE

## 2015-03-21 DIAGNOSIS — M8448XD Pathological fracture, other site, subsequent encounter for fracture with routine healing: Secondary | ICD-10-CM | POA: Diagnosis not present

## 2015-03-21 DIAGNOSIS — I10 Essential (primary) hypertension: Secondary | ICD-10-CM | POA: Diagnosis not present

## 2015-03-21 DIAGNOSIS — L89322 Pressure ulcer of left buttock, stage 2: Secondary | ICD-10-CM | POA: Diagnosis not present

## 2015-03-21 DIAGNOSIS — D649 Anemia, unspecified: Secondary | ICD-10-CM | POA: Diagnosis not present

## 2015-03-21 DIAGNOSIS — B9620 Unspecified Escherichia coli [E. coli] as the cause of diseases classified elsewhere: Secondary | ICD-10-CM | POA: Diagnosis not present

## 2015-03-21 DIAGNOSIS — N390 Urinary tract infection, site not specified: Secondary | ICD-10-CM | POA: Diagnosis not present

## 2015-03-24 ENCOUNTER — Inpatient Hospital Stay: Payer: Medicare Other

## 2015-03-24 DIAGNOSIS — D696 Thrombocytopenia, unspecified: Secondary | ICD-10-CM | POA: Diagnosis not present

## 2015-03-24 DIAGNOSIS — I10 Essential (primary) hypertension: Secondary | ICD-10-CM | POA: Diagnosis not present

## 2015-03-24 DIAGNOSIS — D72821 Monocytosis (symptomatic): Secondary | ICD-10-CM | POA: Diagnosis not present

## 2015-03-24 DIAGNOSIS — E538 Deficiency of other specified B group vitamins: Secondary | ICD-10-CM

## 2015-03-24 DIAGNOSIS — I4891 Unspecified atrial fibrillation: Secondary | ICD-10-CM | POA: Diagnosis not present

## 2015-03-24 DIAGNOSIS — E785 Hyperlipidemia, unspecified: Secondary | ICD-10-CM | POA: Diagnosis not present

## 2015-03-24 MED ORDER — CYANOCOBALAMIN 1000 MCG/ML IJ SOLN
1000.0000 ug | Freq: Once | INTRAMUSCULAR | Status: AC
  Administered 2015-03-24: 1000 ug via INTRAMUSCULAR
  Filled 2015-03-24: qty 1

## 2015-03-28 ENCOUNTER — Ambulatory Visit: Payer: Medicare Other | Admitting: Family Medicine

## 2015-03-28 DIAGNOSIS — D649 Anemia, unspecified: Secondary | ICD-10-CM | POA: Diagnosis not present

## 2015-03-28 DIAGNOSIS — I10 Essential (primary) hypertension: Secondary | ICD-10-CM | POA: Diagnosis not present

## 2015-03-28 DIAGNOSIS — M8448XD Pathological fracture, other site, subsequent encounter for fracture with routine healing: Secondary | ICD-10-CM | POA: Diagnosis not present

## 2015-03-28 DIAGNOSIS — L89322 Pressure ulcer of left buttock, stage 2: Secondary | ICD-10-CM | POA: Diagnosis not present

## 2015-03-28 DIAGNOSIS — B9620 Unspecified Escherichia coli [E. coli] as the cause of diseases classified elsewhere: Secondary | ICD-10-CM | POA: Diagnosis not present

## 2015-03-28 DIAGNOSIS — N390 Urinary tract infection, site not specified: Secondary | ICD-10-CM | POA: Diagnosis not present

## 2015-03-30 DIAGNOSIS — Z950 Presence of cardiac pacemaker: Secondary | ICD-10-CM | POA: Diagnosis not present

## 2015-03-31 ENCOUNTER — Other Ambulatory Visit: Payer: Self-pay | Admitting: Hematology and Oncology

## 2015-03-31 ENCOUNTER — Inpatient Hospital Stay: Payer: Medicare Other

## 2015-03-31 ENCOUNTER — Telehealth: Payer: Self-pay

## 2015-03-31 NOTE — Telephone Encounter (Signed)
Spoke with pt today who came into clinic for B-12 but is not due yet.  Spoke with pt and daughter next visit is on 05/01/2015.  They verbalized an understanding and said that's great we will have time for breakfast.  No concerns noted

## 2015-04-05 DIAGNOSIS — I10 Essential (primary) hypertension: Secondary | ICD-10-CM | POA: Diagnosis not present

## 2015-04-05 DIAGNOSIS — B9620 Unspecified Escherichia coli [E. coli] as the cause of diseases classified elsewhere: Secondary | ICD-10-CM | POA: Diagnosis not present

## 2015-04-05 DIAGNOSIS — L89322 Pressure ulcer of left buttock, stage 2: Secondary | ICD-10-CM | POA: Diagnosis not present

## 2015-04-05 DIAGNOSIS — N390 Urinary tract infection, site not specified: Secondary | ICD-10-CM | POA: Diagnosis not present

## 2015-04-05 DIAGNOSIS — D649 Anemia, unspecified: Secondary | ICD-10-CM | POA: Diagnosis not present

## 2015-04-05 DIAGNOSIS — M8448XD Pathological fracture, other site, subsequent encounter for fracture with routine healing: Secondary | ICD-10-CM | POA: Diagnosis not present

## 2015-04-06 ENCOUNTER — Encounter: Payer: Self-pay | Admitting: Urology

## 2015-04-06 ENCOUNTER — Ambulatory Visit (INDEPENDENT_AMBULATORY_CARE_PROVIDER_SITE_OTHER): Payer: Medicare Other | Admitting: Urology

## 2015-04-06 ENCOUNTER — Telehealth: Payer: Self-pay

## 2015-04-06 VITALS — BP 151/78 | HR 88 | Ht 61.0 in | Wt 112.3 lb

## 2015-04-06 DIAGNOSIS — R339 Retention of urine, unspecified: Secondary | ICD-10-CM

## 2015-04-06 DIAGNOSIS — N390 Urinary tract infection, site not specified: Secondary | ICD-10-CM | POA: Diagnosis not present

## 2015-04-06 NOTE — Telephone Encounter (Signed)
Home health nurse called stating pt daughter called her c/o dark brown urine. [facility]UA nurse read off Dr. Carlynn Purl dictation from today's visit. Home health nurse stated per daughter foley is draining.  [facility] nurse reinforced with home health nurse that as long as the foley is draining and not symptomatic then Dr. Pilar Jarvis does not want to treat pt for infection. Home health nurse voiced understanding.

## 2015-04-06 NOTE — Progress Notes (Signed)
04/06/2015 11:29 AM   Chauncey Mann [birth year] [id number]  Referring provider: Leone Haven, MD [address] [place], [place] [address]  Chief Complaint  Patient presents with  . Urinary Retention    HPI: The patient is a 81 year old female who presents after 550 cc urinary retention. She is also recurrent UTIs over the last 6 weeks. She has been told that the UTIs were from her inability to empty the bladder. She's not have issues with UTIs prior to this time however she had frequency, hesitancy, nocturia 4, incontinence, intermittency, and weak stream. She denies hematuria and kidney stones.   Interval history: The patient was recently seen by her primary care provider. She had foul-smelling urine, and her urinalysis was positive though she had a catheter in place. She was treated for urinary tract infection.   PMH: Past Medical History  Diagnosis Date  . HTN (hypertension)   . Syncope   . Hyperlipidemia   . Ischemic colitis ([place])   . Thrombocytopenia ([place])   . Mobitz type 2 second degree heart block   . Anemia   . Pacemaker   . Arthritis   . Arrhythmia   . Atrial fibrillation ([place])   . Stroke [facility])     Surgical History: Past Surgical History  Procedure Laterality Date  . Left hip replacement     . Cholecystectomy    . Pacemaker placement      Home Medications:    Medication List       This list is accurate as of: 04/06/15 11:29 AM.  Always use your most recent med list.               aspirin EC 81 MG tablet  Take 81 mg by mouth daily.     carvedilol 12.5 MG tablet  Commonly known as:  COREG  Take 12.5 mg by mouth 2 (two) times daily.     Fish Oil 1000 MG Caps  Take 2,000 mg by mouth daily.     furosemide 20 MG tablet  Commonly known as:  LASIX  Take 10 mg by mouth daily as needed for edema.     hydrALAZINE 10 MG tablet  Commonly known as:  APRESOLINE  Take 10 mg by mouth 2 (two) times daily.     HYDROcodone-acetaminophen  5-325 MG tablet  Commonly known as:  NORCO/VICODIN  Take 1 tablet by mouth 4 (four) times daily as needed for moderate pain.     latanoprost 0.005 % ophthalmic solution  Commonly known as:  XALATAN  Place 1 drop into both eyes at bedtime.     losartan 100 MG tablet  Commonly known as:  COZAAR  Take 50 mg by mouth 2 (two) times daily.     multivitamin with minerals Tabs tablet  Take 1 tablet by mouth daily.     PRESERVISION AREDS 2 Caps  Take 2 capsules by mouth daily.     ZINC OXIDE (TOPICAL) 10 % Crea  Apply 1 application topically 2 (two) times daily. To affected area        Allergies:  Allergies  Allergen Reactions  . Atenolol Other (See Comments)    Reaction:  Complete Heart Block  . Penicillins Other (See Comments)    rash  . Dilaudid [Hydromorphone Hcl] Rash    Family History: Family History  Problem Relation Age of Onset  . Stroke Mother   . Alcoholism Father   . Hypertension Sister   . Prostate cancer Neg Hx   .  Bladder Cancer Neg Hx   . Kidney cancer Neg Hx     Social History:  reports that she has never smoked. She has never used smokeless tobacco. She reports that she drinks about 0.6 oz of alcohol per week. She reports that she does not use illicit drugs.  ROS:                                        Physical Exam: BP 151/78 mmHg  Pulse 88  Ht 5\' 1"  (1.549 m)  Wt 112 lb 4.8 oz (50.939 kg)  BMI 21.23 kg/m2  Constitutional:  Alert and oriented, No acute distress. HEENT: [place] AT, moist mucus membranes.  Trachea midline, no masses. Cardiovascular: No clubbing, cyanosis, or edema. Respiratory: Normal respiratory effort, no increased work of breathing. GI: Abdomen is soft, nontender, nondistended, no abdominal masses GU: No CVA tenderness.  Skin: No rashes, bruises or suspicious lesions. Lymph: No cervical or inguinal adenopathy. Neurologic: Grossly intact, no focal deficits, moving all 4 extremities. Psychiatric: Normal mood  and affect.  Laboratory Data: Lab Results  Component Value Date   WBC 6.6 03/10/2015   HGB 10.8* 03/10/2015   HCT 32.2* 03/10/2015   MCV 89.5 03/10/2015   PLT 108* 03/10/2015    Lab Results  Component Value Date   CREATININE 1.03 03/17/2015    No results found for: PSA  No results found for: TESTOSTERONE  No results found for: HGBA1C  Urinalysis    Component Value Date/Time   COLORURINE STRAW* 02/09/2015 1722   COLORURINE YELLOW 01/13/2013 1238   APPEARANCEUR CLEAR* 02/09/2015 1722   APPEARANCEUR CLEAR 01/13/2013 1238   LABSPEC 1.003* 02/09/2015 1722   LABSPEC 1.005 01/13/2013 1238   PHURINE 6.0 02/09/2015 1722   PHURINE 6.0 01/13/2013 1238   GLUCOSEU NEGATIVE 02/09/2015 1722   GLUCOSEU NEGATIVE 01/13/2013 1238   HGBUR NEGATIVE 02/09/2015 1722   HGBUR 2+ 01/13/2013 1238   BILIRUBINUR small 03/17/2015 1359   BILIRUBINUR NEGATIVE 02/09/2015 1722   BILIRUBINUR NEGATIVE 01/13/2013 1238   KETONESUR NEGATIVE 02/09/2015 1722   KETONESUR 1+ 01/13/2013 1238   PROTEINUR trace 03/17/2015 1359   PROTEINUR NEGATIVE 02/09/2015 1722   PROTEINUR NEGATIVE 01/13/2013 1238   UROBILINOGEN 0.2 03/17/2015 1359   NITRITE positive 03/17/2015 1359   NITRITE NEGATIVE 02/09/2015 1722   NITRITE POSITIVE 01/13/2013 1238   LEUKOCYTESUR large (3+)* 03/17/2015 1359   LEUKOCYTESUR 3+ 01/13/2013 1238     Assessment & Plan:    1. Urinary retention Continue Foley catheter to dependent drainage. Change catheter every month or sooner as needed.  2. Recurrent UTI The patient should only be treated for urinary tract infection when she is symptomatic. The patient is chronically colonized with bacteria secondary to having a chronic catheter and will always have a positive urinalysis and a positive urine culture. Also recommend not treating for urinary tract infection in the setting of foul-smelling urine if the patient is asymptomatic. I discussed this in great detail with the patient and her  daughter.  Return in about 3 months (around 07/07/2015).  Nickie Retort, MD  [facility] [address], [place], [place] [address] [phone]

## 2015-04-09 DIAGNOSIS — L89322 Pressure ulcer of left buttock, stage 2: Secondary | ICD-10-CM | POA: Diagnosis not present

## 2015-04-09 DIAGNOSIS — M8448XD Pathological fracture, other site, subsequent encounter for fracture with routine healing: Secondary | ICD-10-CM | POA: Diagnosis not present

## 2015-04-09 DIAGNOSIS — N390 Urinary tract infection, site not specified: Secondary | ICD-10-CM | POA: Diagnosis not present

## 2015-04-09 DIAGNOSIS — B9620 Unspecified Escherichia coli [E. coli] as the cause of diseases classified elsewhere: Secondary | ICD-10-CM | POA: Diagnosis not present

## 2015-04-09 DIAGNOSIS — D649 Anemia, unspecified: Secondary | ICD-10-CM | POA: Diagnosis not present

## 2015-04-09 DIAGNOSIS — I10 Essential (primary) hypertension: Secondary | ICD-10-CM | POA: Diagnosis not present

## 2015-04-17 ENCOUNTER — Ambulatory Visit (INDEPENDENT_AMBULATORY_CARE_PROVIDER_SITE_OTHER): Payer: Medicare Other | Admitting: Family Medicine

## 2015-04-17 ENCOUNTER — Encounter: Payer: Self-pay | Admitting: Family Medicine

## 2015-04-17 VITALS — BP 120/62 | HR 86 | Temp 99.6°F | Ht 61.0 in | Wt 117.6 lb

## 2015-04-17 DIAGNOSIS — R339 Retention of urine, unspecified: Secondary | ICD-10-CM

## 2015-04-17 DIAGNOSIS — L89321 Pressure ulcer of left buttock, stage 1: Secondary | ICD-10-CM

## 2015-04-17 DIAGNOSIS — B372 Candidiasis of skin and nail: Secondary | ICD-10-CM | POA: Diagnosis not present

## 2015-04-17 NOTE — Assessment & Plan Note (Signed)
Catheter in place. Followed by urology. Is to have the catheter replaced on Thursday.

## 2015-04-17 NOTE — Progress Notes (Signed)
Pre visit review using our clinic review tool, if applicable. No additional management support is needed unless otherwise documented below in the visit note. 

## 2015-04-17 NOTE — Assessment & Plan Note (Addendum)
Area on left buttock is consistent with stage I pressure ulcer. There is no induration, warmth, or fluctuance to indicate infection. There is no drainage. Patient is afebrile today. We will give a written prescription for pressure reducer pillow as requested by the nurse. Advised to alternate sides of pressure. They're advised to monitor this area and if there are any changes they will let us know. Given return precautions.

## 2015-04-17 NOTE — Patient Instructions (Signed)
Nice to see you. Please use the pressure reducer pillow to help alleviate pressure on her left buttock. Please monitor this area and if it becomes increasingly red, the redness spreads, drains anything, it becomes firm, or she develops fever please seek medical attention. Please keep your appointment to have the catheter changed on Thursday. Please monitor for any new symptoms. If you develop chest pain, shortness of breath, abdominal pain, headache, vision changes, numbness, weakness, urinary symptoms, vaginal discharge, rash, joint aches, temperature greater than 100.4 Fahrenheit, chills, body aches, or any new or change in symptoms please seek medical attention immediately.

## 2015-04-17 NOTE — Progress Notes (Signed)
Patient ID: [name], female   DOB: 12-09-1922, 81 y.o.   MRN: [id number]  Tommi Rumps, MD Phone: [phone]  [name] is a 81 y.o. female who presents today for follow-up.  Neurogenic bladder: Patient notes she saw urology. They advised her not to take any antibiotics unless she was symptomatic. She notes she is getting the bag changed on Thursday. She denies any abdominal pain. She denies any urinary complaints.  Pressure ulcer: patient and caregiver notes this was improved. They saw wound care and they packed it and it subsequently resolved. And then it came back. Then it went away again. And then it came back again. Notes it is a small pinpoint area at this time. It has not been warm or draining anything. There is been no spreading redness. She's not had any fevers.  They report that her candidal intertrigo is improved. Patient also notes that she has been mildly hyponatremic persistently in the past. She notes she does not take the Lasix very often.  Caregiver additionally notes the patient's temperature today is 99.6 Fahrenheit. She notes that the patient's temperature is usually in the 97.2-97.6 Fahrenheit range. Patient notes she overall feels well. She does note that she feels a little more tired than usual though notes and she has been at her new place of living she has not slept well at night. She denies fevers, headache, vision change, numbness, weakness, chest pain, shortness of breath or abdominal pain, nausea, vomiting, diarrhea, chills, new rashes other than recurrent pressure ulcer, and aches.  PMH: nonsmoker.   ROS see history of present illness  Objective  Physical Exam Filed Vitals:   04/17/15 1314  BP: 120/62  Pulse: 86  Temp: 99.6 F (37.6 C)    Physical Exam  Constitutional: She is well-developed, well-nourished, and in no distress.  HENT:  Head: Normocephalic and atraumatic.  Right Ear: External ear normal.  Left Ear: External ear normal.    Mouth/Throat: Oropharynx is clear and moist. No oropharyngeal exudate.  Normal TMs bilaterally  Eyes: Conjunctivae are normal. Pupils are equal, round, and reactive to light.  Neck: Neck supple.  Cardiovascular: Normal rate, regular rhythm and normal heart sounds.  Exam reveals no gallop and no friction rub.   No murmur heard. Pulmonary/Chest: Effort normal and breath sounds normal. No respiratory distress. She has no wheezes. She has no rales.  Abdominal: Soft. Bowel sounds are normal. She exhibits no distension. There is no tenderness. There is no rebound and no guarding.  Musculoskeletal: She exhibits no edema.  Lymphadenopathy:    She has no cervical adenopathy.  Neurological: She is alert.  Moves all extremities equally  Skin: Skin is warm and dry. She is not diaphoretic.     No other rashes noted   no candidal intertrigo noted in inguinal area   Assessment/Plan: Please see individual problem list.  Stage I pressure ulcer of left buttock Area on left buttock is consistent with stage I pressure ulcer. There is no induration, warmth, or fluctuance to indicate infection. There is no drainage. Patient is afebrile today. We will give a written prescription for pressure reducer pillow as requested by the nurse. Advised to alternate sides of pressure. They're advised to monitor this area and if there are any changes they will let us know. Given return precautions.  Candidal intertrigo, inguinal Resolved. Continue to monitor for recurrence.  Urinary retention Catheter in place. Followed by urology. Is to have the catheter replaced on Thursday.   I discussed at  length with patient and caregiver that her temperature is in the normal range. She has no evidence on exam or history of any infectious causes. She feels overall well and appears well. She is nontoxic. I discussed that we would need to continue to monitor this. They will check her temperature at home. If it is 100.4 Fahrenheit or  above or if she develops any new symptoms or the pressure ulcer changes they will seek medical attention. Given return precautions. I additionally discussed rechecking a BMP to evaluate her sodium again. It has been stable recently and appears to fluctuate mostly between 132 and 135 in the past. Patient and caregiver declined obtaining this today and opted to have this checked at next visit.  Tommi Rumps

## 2015-04-17 NOTE — Assessment & Plan Note (Signed)
Resolved. Continue to monitor for recurrence.

## 2015-04-19 ENCOUNTER — Telehealth: Payer: Self-pay | Admitting: Family Medicine

## 2015-04-19 DIAGNOSIS — R509 Fever, unspecified: Secondary | ICD-10-CM | POA: Diagnosis not present

## 2015-04-19 NOTE — Telephone Encounter (Signed)
Patient needs evaluation today for this. I would advise evaluation at an urgent care this evening, she can go to the [facility] walk in clinic. If she is having chills, abdominal pain, nausea, or vomiting she should go to the ED for evaluation.

## 2015-04-19 NOTE — Telephone Encounter (Signed)
Spoke with the daughter, she verbally understood and will take her mother to either the [facility] clinic or the ED.

## 2015-04-19 NOTE — Telephone Encounter (Signed)
Caller name: Joesph July Relationship to patient: daughter Can be reached: [phone]  Reason for call: called to state pt temp for today has been 100.2 and 100.4. Please advise

## 2015-04-19 NOTE — Telephone Encounter (Signed)
Please advise 

## 2015-04-20 ENCOUNTER — Encounter: Payer: Self-pay | Admitting: Urology

## 2015-04-20 ENCOUNTER — Ambulatory Visit (INDEPENDENT_AMBULATORY_CARE_PROVIDER_SITE_OTHER): Payer: Medicare Other | Admitting: Urology

## 2015-04-20 VITALS — BP 110/66 | HR 79 | Ht 61.0 in | Wt 116.2 lb

## 2015-04-20 DIAGNOSIS — N3000 Acute cystitis without hematuria: Secondary | ICD-10-CM | POA: Diagnosis not present

## 2015-04-20 DIAGNOSIS — R339 Retention of urine, unspecified: Secondary | ICD-10-CM

## 2015-04-20 DIAGNOSIS — K559 Vascular disorder of intestine, unspecified: Secondary | ICD-10-CM | POA: Insufficient documentation

## 2015-04-20 MED ORDER — SULFAMETHOXAZOLE-TRIMETHOPRIM 800-160 MG PO TABS: 1.0000 | ORAL_TABLET | Freq: Two times a day (BID) | ORAL | Status: DC

## 2015-04-20 NOTE — Addendum Note (Signed)
Addended by: Wilson Singer on: 04/20/2015 04:01 PM   Modules accepted: Orders, Medications

## 2015-04-20 NOTE — Progress Notes (Signed)
Simple Catheter Placement  Due to urinary retention patient is present today for a foley cath placement.  Patient was cleaned and prepped in a sterile fashion with betadine and lidocaine jelly 2% was instilled into the urethra.  A 16 FR foley catheter was inserted, urine return was noted  12 ml, urine was dark yellow in color.  The balloon was filled with 8 cc of sterile water.  A leg bag was attached for drainage. Patient was also given a legt bag to take home and was given instruction on how to change from one bag to another.  Patient was given instruction on proper catheter care.  Patient tolerated well, no complications were noted   Preformed by: K.Russell,CMA  Additional notes/ Follow up: 1 mth

## 2015-04-20 NOTE — Progress Notes (Signed)
04/20/2015 11:51 AM   [name] [birth year] [id number]  Referring provider: Leone Haven, MD [address] [place], [place] [address]  No chief complaint on file.   CC: Urinary retention, UTI  HPI: The patient is a 81 year old female who presents after 550 cc urinary retention. She is also recurrent UTIs over the last 6 weeks. She has been told that the UTIs were from her inability to empty the bladder. She's not have issues with UTIs prior to this time however she had frequency, hesitancy, nocturia 4, incontinence, intermittency, and weak stream. She denies hematuria and kidney stones.   Interval history: The patient returns for catheter change. Over the last days she developed a fever the level over 101. She was seen in the ER but not started on antibiotics. Her family states that fever is usually her first and only symptom of urinary tract infection. She also has been weaker and lethargic more so than baseline the last 2 days.   PMH: Past Medical History  Diagnosis Date  . HTN (hypertension)   . Syncope   . Hyperlipidemia   . Ischemic colitis ([place])   . Thrombocytopenia ([place])   . Mobitz type 2 second degree heart block   . Anemia   . Pacemaker   . Arthritis   . Arrhythmia   . Atrial fibrillation ([place])   . Stroke [facility])     Surgical History: Past Surgical History  Procedure Laterality Date  . Left hip replacement     . Cholecystectomy    . Pacemaker placement      Home Medications:    Medication List       This list is accurate as of: 04/20/15 11:51 AM.  Always use your most recent med list.               aspirin EC 81 MG tablet  Take 81 mg by mouth daily.     carvedilol 12.5 MG tablet  Commonly known as:  COREG  Take 12.5 mg by mouth 2 (two) times daily.     Fish Oil 1000 MG Caps  Take 2,000 mg by mouth daily.     furosemide 20 MG tablet  Commonly known as:  LASIX  Take 10 mg by mouth daily as needed for edema.     hydrALAZINE 10 MG tablet  Commonly known as:  APRESOLINE  Take 10 mg by mouth 2 (two) times daily.     HYDROcodone-acetaminophen 5-325 MG tablet  Commonly known as:  NORCO/VICODIN  Take 1 tablet by mouth 4 (four) times daily as needed for moderate pain.     latanoprost 0.005 % ophthalmic solution  Commonly known as:  XALATAN  Place 1 drop into both eyes at bedtime.     losartan 100 MG tablet  Commonly known as:  COZAAR  Take 50 mg by mouth 2 (two) times daily.     multivitamin with minerals Tabs tablet  Take 1 tablet by mouth daily.     PRESERVISION AREDS 2 Caps  Take 2 capsules by mouth daily.     sulfamethoxazole-trimethoprim 800-160 MG tablet  Commonly known as:  BACTRIM DS,SEPTRA DS  Take 1 tablet by mouth every 12 (twelve) hours.     ZINC OXIDE (TOPICAL) 10 % Crea  Apply 1 application topically 2 (two) times daily. To affected area        Allergies:  Allergies  Allergen Reactions  . Atenolol Other (See Comments)    Reaction:  Complete Heart  Block  . Penicillins Other (See Comments)    rash  . Dilaudid [Hydromorphone Hcl] Rash    Family History: Family History  Problem Relation Age of Onset  . Stroke Mother   . Alcoholism Father   . Hypertension Sister   . Prostate cancer Neg Hx   . Bladder Cancer Neg Hx   . Kidney cancer Neg Hx     Social History:  reports that she has never smoked. She has never used smokeless tobacco. She reports that she drinks about 0.6 oz of alcohol per week. She reports that she does not use illicit drugs.  ROS:                                        Physical Exam: BP 110/66 mmHg  Pulse 79  Ht 5\' 1"  (1.549 m)  Wt 116 lb 3.2 oz (52.708 kg)  BMI 21.97 kg/m2  Constitutional:  Alert and oriented, No acute distress. HEENT: [place] AT, moist mucus membranes.  Trachea midline, no masses. Cardiovascular: No clubbing, cyanosis, or edema. Respiratory: Normal respiratory effort, no increased work of breathing. GI:  Abdomen is soft, nontender, nondistended, no abdominal masses GU: No CVA tenderness. Foley in place. Cloudy output Skin: No rashes, bruises or suspicious lesions. Lymph: No cervical or inguinal adenopathy. Neurologic: Grossly intact, no focal deficits, moving all 4 extremities. Psychiatric: Normal mood and affect.  Laboratory Data: Lab Results  Component Value Date   WBC 6.6 03/10/2015   HGB 10.8* 03/10/2015   HCT 32.2* 03/10/2015   MCV 89.5 03/10/2015   PLT 108* 03/10/2015    Lab Results  Component Value Date   CREATININE 1.03 03/17/2015    No results found for: PSA  No results found for: TESTOSTERONE  No results found for: HGBA1C  Urinalysis    Component Value Date/Time   COLORURINE STRAW* 02/09/2015 1722   COLORURINE YELLOW 01/13/2013 1238   APPEARANCEUR CLEAR* 02/09/2015 1722   APPEARANCEUR CLEAR 01/13/2013 1238   LABSPEC 1.003* 02/09/2015 1722   LABSPEC 1.005 01/13/2013 1238   PHURINE 6.0 02/09/2015 1722   PHURINE 6.0 01/13/2013 1238   GLUCOSEU NEGATIVE 02/09/2015 1722   GLUCOSEU NEGATIVE 01/13/2013 1238   HGBUR NEGATIVE 02/09/2015 1722   HGBUR 2+ 01/13/2013 1238   BILIRUBINUR small 03/17/2015 1359   BILIRUBINUR NEGATIVE 02/09/2015 1722   BILIRUBINUR NEGATIVE 01/13/2013 1238   KETONESUR NEGATIVE 02/09/2015 1722   KETONESUR 1+ 01/13/2013 1238   PROTEINUR trace 03/17/2015 1359   PROTEINUR NEGATIVE 02/09/2015 1722   PROTEINUR NEGATIVE 01/13/2013 1238   UROBILINOGEN 0.2 03/17/2015 1359   NITRITE positive 03/17/2015 1359   NITRITE NEGATIVE 02/09/2015 1722   NITRITE POSITIVE 01/13/2013 1238   LEUKOCYTESUR large (3+)* 03/17/2015 1359   LEUKOCYTESUR 3+ 01/13/2013 1238      Assessment & Plan:    1. Symptomatic UTI The patient has a symptomatic urinary tract infection she's having reversed. 101. We will treat her prophylactically with Bactrim DS twice a day for 1 week as her previous cultures are sensitive to this. We changed her catheter today and sent a  urine culture from the new catheter. We will adjust her antibiotics accordingly if needed. Again, she should only be treated when she has symptomatic urinary tract infections.  2. Urinary retention We exchanged her Foley catheter today. She will need this again in 1 month's time.   She'll follow up with me in 1 month  for catheter changes and to see how she does in regards to symptomatic urinary tract infections over the next month.   Return in about 1 month (around 05/20/2015).  Nickie Retort, MD  [facility] [address] [place],  [address] [phone]

## 2015-04-22 LAB — CULTURE, URINE COMPREHENSIVE

## 2015-04-24 ENCOUNTER — Encounter: Payer: Self-pay | Admitting: Family Medicine

## 2015-04-24 ENCOUNTER — Ambulatory Visit (INDEPENDENT_AMBULATORY_CARE_PROVIDER_SITE_OTHER): Payer: Medicare Other | Admitting: Family Medicine

## 2015-04-24 VITALS — BP 120/60 | HR 85 | Temp 98.6°F | Resp 16 | Wt 127.0 lb

## 2015-04-24 DIAGNOSIS — N390 Urinary tract infection, site not specified: Secondary | ICD-10-CM

## 2015-04-24 DIAGNOSIS — S8991XA Unspecified injury of right lower leg, initial encounter: Secondary | ICD-10-CM

## 2015-04-24 DIAGNOSIS — M545 Low back pain, unspecified: Secondary | ICD-10-CM

## 2015-04-24 DIAGNOSIS — L89321 Pressure ulcer of left buttock, stage 1: Secondary | ICD-10-CM

## 2015-04-24 NOTE — Patient Instructions (Addendum)
Nice to see you. We will refer you back to physical therapy through home health. Please continue using a pressure reducing pillow. You should continue applying the barrier cream. You can use gauze under Band-Aids take pressure off the areas on her buttocks. She needs to alternate sides when laying down. You can do this by placing a pillow underneath the opposite side Please monitor this closely and if it worsens let us know. If you develop any numbness, weakness, loss of bowel function, fevers, redness, warmth, firmness, or any new or change in symptoms please seek medical attention immediately.

## 2015-04-24 NOTE — Progress Notes (Signed)
Patient ID: [name], female   DOB: Mar 01, 1923, 81 y.o.   MRN: [id number]  Tommi Rumps, MD Phone: [phone]  [name] is a 81 y.o. female who presents today for same-day visit.  Right knee injury: Patient notes on Saturday she was sitting in bed and she spilled caramel corn on the floor. She attempted to get up to get it off the floor. When she got down to the floor she cannot stand up and then hit her right knee. She denied pain in her leg. She denies loss of consciousness and head injury. She had no other symptoms with this. She notes EMS came out to help get her up off the floor. She notes Sunday in the morning the right knee became swollen. They used ice and Aleve on it and this improved. She notes no pain today. She notes it looks improved today.  Pressure ulcer: They note that this area on her buttocks looks mildly enlarged. They have been using the pressure reducing pillow. She has not had any spreading erythema. She's not had any fevers.  UTI: Patient notes she went [facility] following her last office visit as she developed fevers. They they spoke with urology regarding this, though did not start her on any antibiotics and she followed up with the urologist the next day. She was placed on Bactrim and a culture was sent by urology. She notes she is improved since being on the Bactrim. She not had any fevers. She has no abdominal pain.  Right low back pain: Patient notes since Thursday she has had right low back muscular pain. She notes this occurred after laying on the table at urology for a long period of time. She notes this only hurts when she stretches her back. She's not had any incontinence, saddle anesthesia, fevers, weakness, numbness, or history of cancer. She notes this is similar to her prior low back pain.  PMH: nonsmoker.   ROS see history of present illness  Objective  Physical Exam Filed Vitals:   04/24/15 1525  BP: 120/60  Pulse: 85  Temp: 98.6 F (37  C)  Resp: 16    Physical Exam  Constitutional: She is well-developed, well-nourished, and in no distress.  HENT:  Head: Normocephalic and atraumatic.  Cardiovascular: Normal rate, regular rhythm and normal heart sounds.  Exam reveals no gallop and no friction rub.   No murmur heard. Pulmonary/Chest: Effort normal and breath sounds normal. No respiratory distress. She has no wheezes. She has no rales.  Musculoskeletal:  No midline spine tenderness, no midline step-off, no muscular back tenderness and no CVA tenderness, no swelling in her back or erythema Right knee with no swelling, there is no bony tenderness or joint line tenderness, there is no erythema, there is no bruising, there is no ligamentous laxity, there is negative McMurray's, left knee with no swelling, bony tenderness, joint line tenderness, erythema, bruising, or ligamentous laxity, there is negative McMurray's  Neurological: She is alert.  5 out of 5 strength in bilateral quads, hamstrings, plantar flexion, and dorsiflexion, sensation to light touch intact in bilateral lower extremities, 2+ patellar reflexes  Skin: She is not diaphoretic.        Assessment/Plan: Please see individual problem list.  UTI (lower urinary tract infection) Patient seen by urology and started on Bactrim for UTI. Urine culture reviewed and appears to be sensitive to Bactrim. Patient is much improved. She will finish the course of Bactrim. She's given return precautions.  Stage I pressure  ulcer of left buttock Area on buttocks remains consistent stage I pressure ulcer. There is a second lesion just inferior to the initial lesion that is consistent with a stage I pressure ulcer. There is no induration, warmth, or fluctuance to indicate infection. There is no drainage. Patient is afebrile and well-appearing today. They will continue to use the pressure reducer pillow. They will alternate sides that she lays on. They will apply gauze pads to this to  reduce pressure. They will continue hydrocortisone cream. They will continue to monitor this and if it worsens and will let us know so that we can have the patient be seen back at the wound center. They're given return precautions.  Right knee injury Patient with right knee injury over the weekend related to her inability to stand up on her own after crouching the ground. Her exam today is benign. Given her normal exam I do not think it's necessary to obtain x-rays at this time. Given that she did have difficulty going from crouching to standing we will reinstate home PT to work on strength. They will continue to monitor this and if she develops swelling or pain she'll be reevaluated. Given return precautions.  Low back pain Patient with right low back pain consistent with musculoskeletal back pain. She has no pain at this time. She has benign exam today. She has no red flags. She is neurologically intact. We will continue to monitor this and if it worsens or she develops new symptoms she will follow-up. She is given return precautions.    Orders Placed This Encounter  Procedures  . Ambulatory referral to Home Health    Referral Priority:  Routine    Referral Type:  Home Health Care    Referral Reason:  Specialty Services Required    Requested Specialty:  [place]    Number of Visits Requested:  1    Tommi Rumps

## 2015-04-24 NOTE — Progress Notes (Signed)
Pre visit review using our clinic review tool, if applicable. No additional management support is needed unless otherwise documented below in the visit note. 

## 2015-04-25 ENCOUNTER — Other Ambulatory Visit: Payer: Self-pay

## 2015-04-25 DIAGNOSIS — D696 Thrombocytopenia, unspecified: Secondary | ICD-10-CM

## 2015-04-25 DIAGNOSIS — M545 Low back pain, unspecified: Secondary | ICD-10-CM | POA: Insufficient documentation

## 2015-04-25 DIAGNOSIS — S8991XA Unspecified injury of right lower leg, initial encounter: Secondary | ICD-10-CM | POA: Insufficient documentation

## 2015-04-25 NOTE — Assessment & Plan Note (Signed)
Area on buttocks remains consistent stage I pressure ulcer. There is a second lesion just inferior to the initial lesion that is consistent with a stage I pressure ulcer. There is no induration, warmth, or fluctuance to indicate infection. There is no drainage. Patient is afebrile and well-appearing today. They will continue to use the pressure reducer pillow. They will alternate sides that she lays on. They will apply gauze pads to this to reduce pressure. They will continue hydrocortisone cream. They will continue to monitor this and if it worsens and will let us know so that we can have the patient be seen back at the wound center. They're given return precautions.

## 2015-04-25 NOTE — Assessment & Plan Note (Signed)
Patient with right low back pain consistent with musculoskeletal back pain. She has no pain at this time. She has benign exam today. She has no red flags. She is neurologically intact. We will continue to monitor this and if it worsens or she develops new symptoms she will follow-up. She is given return precautions.

## 2015-04-25 NOTE — Assessment & Plan Note (Signed)
Patient with right knee injury over the weekend related to her inability to stand up on her own after crouching the ground. Her exam today is benign. Given her normal exam I do not think it's necessary to obtain x-rays at this time. Given that she did have difficulty going from crouching to standing we will reinstate home PT to work on strength. They will continue to monitor this and if she develops swelling or pain she'll be reevaluated. Given return precautions.

## 2015-04-25 NOTE — Assessment & Plan Note (Signed)
Patient seen by urology and started on Bactrim for UTI. Urine culture reviewed and appears to be sensitive to Bactrim. Patient is much improved. She will finish the course of Bactrim. She's given return precautions.

## 2015-05-01 ENCOUNTER — Other Ambulatory Visit: Payer: Self-pay | Admitting: Hematology and Oncology

## 2015-05-01 ENCOUNTER — Inpatient Hospital Stay (HOSPITAL_BASED_OUTPATIENT_CLINIC_OR_DEPARTMENT_OTHER): Payer: Medicare Other | Admitting: Hematology and Oncology

## 2015-05-01 ENCOUNTER — Telehealth: Payer: Self-pay

## 2015-05-01 ENCOUNTER — Telehealth: Payer: Self-pay | Admitting: *Deleted

## 2015-05-01 ENCOUNTER — Inpatient Hospital Stay: Payer: Medicare Other

## 2015-05-01 ENCOUNTER — Inpatient Hospital Stay: Payer: Medicare Other | Attending: Hematology and Oncology

## 2015-05-01 VITALS — BP 167/79 | HR 81 | Temp 98.3°F | Resp 18 | Ht 61.0 in | Wt 117.5 lb

## 2015-05-01 DIAGNOSIS — E538 Deficiency of other specified B group vitamins: Secondary | ICD-10-CM | POA: Insufficient documentation

## 2015-05-01 DIAGNOSIS — I4891 Unspecified atrial fibrillation: Secondary | ICD-10-CM | POA: Diagnosis not present

## 2015-05-01 DIAGNOSIS — Z79899 Other long term (current) drug therapy: Secondary | ICD-10-CM | POA: Diagnosis not present

## 2015-05-01 DIAGNOSIS — D6489 Other specified anemias: Secondary | ICD-10-CM

## 2015-05-01 DIAGNOSIS — D696 Thrombocytopenia, unspecified: Secondary | ICD-10-CM | POA: Insufficient documentation

## 2015-05-01 DIAGNOSIS — D72821 Monocytosis (symptomatic): Secondary | ICD-10-CM

## 2015-05-01 DIAGNOSIS — E785 Hyperlipidemia, unspecified: Secondary | ICD-10-CM | POA: Insufficient documentation

## 2015-05-01 DIAGNOSIS — Z950 Presence of cardiac pacemaker: Secondary | ICD-10-CM | POA: Insufficient documentation

## 2015-05-01 DIAGNOSIS — D649 Anemia, unspecified: Secondary | ICD-10-CM | POA: Insufficient documentation

## 2015-05-01 DIAGNOSIS — Z8673 Personal history of transient ischemic attack (TIA), and cerebral infarction without residual deficits: Secondary | ICD-10-CM | POA: Insufficient documentation

## 2015-05-01 DIAGNOSIS — I10 Essential (primary) hypertension: Secondary | ICD-10-CM | POA: Insufficient documentation

## 2015-05-01 DIAGNOSIS — M1990 Unspecified osteoarthritis, unspecified site: Secondary | ICD-10-CM | POA: Insufficient documentation

## 2015-05-01 DIAGNOSIS — Z7982 Long term (current) use of aspirin: Secondary | ICD-10-CM | POA: Insufficient documentation

## 2015-05-01 LAB — CBC WITH DIFFERENTIAL/PLATELET
Basophils Absolute: 0 10*3/uL (ref 0–0.1)
Basophils Relative: 1 %
Eosinophils Absolute: 0 10*3/uL (ref 0–0.7)
Eosinophils Relative: 1 %
HCT: 29.7 % — ABNORMAL LOW (ref 35.0–47.0)
Hemoglobin: 9.9 g/dL — ABNORMAL LOW (ref 12.0–16.0)
Lymphocytes Relative: 16 %
Lymphs Abs: 1.3 10*3/uL (ref 1.0–3.6)
MCH: 28.8 pg (ref 26.0–34.0)
MCHC: 33.4 g/dL (ref 32.0–36.0)
MCV: 86.3 fL (ref 80.0–100.0)
Monocytes Absolute: 1.5 10*3/uL — ABNORMAL HIGH (ref 0.2–0.9)
Monocytes Relative: 19 %
Neutro Abs: 5 10*3/uL (ref 1.4–6.5)
Neutrophils Relative %: 63 %
Platelets: 106 10*3/uL — ABNORMAL LOW (ref 150–440)
RBC: 3.44 MIL/uL — ABNORMAL LOW (ref 3.80–5.20)
RDW: 14.3 % (ref 11.5–14.5)
WBC: 7.9 10*3/uL (ref 3.6–11.0)

## 2015-05-01 LAB — COMPREHENSIVE METABOLIC PANEL
ALT: 11 U/L — ABNORMAL LOW (ref 14–54)
AST: 14 U/L — ABNORMAL LOW (ref 15–41)
Albumin: 3.1 g/dL — ABNORMAL LOW (ref 3.5–5.0)
Alkaline Phosphatase: 93 U/L (ref 38–126)
Anion gap: 6 (ref 5–15)
BUN: 12 mg/dL (ref 6–20)
CO2: 25 mmol/L (ref 22–32)
Calcium: 8.6 mg/dL — ABNORMAL LOW (ref 8.9–10.3)
Chloride: 97 mmol/L — ABNORMAL LOW (ref 101–111)
Creatinine, Ser: 0.91 mg/dL (ref 0.44–1.00)
GFR calc Af Amer: >60 mL/min (ref >60)
GFR calc non Af Amer: 54 mL/min — ABNORMAL LOW (ref >60)
Glucose, Bld: 147 mg/dL — ABNORMAL HIGH (ref 65–99)
Potassium: 4.2 mmol/L (ref 3.5–5.1)
Sodium: 128 mmol/L — ABNORMAL LOW (ref 135–145)
Total Bilirubin: 0.5 mg/dL (ref 0.3–1.2)
Total Protein: 7.1 g/dL (ref 6.5–8.1)

## 2015-05-01 MED ORDER — CYANOCOBALAMIN 1000 MCG/ML IJ SOLN
1000.0000 ug | Freq: Once | INTRAMUSCULAR | Status: AC
  Administered 2015-05-01: 1000 ug via INTRAMUSCULAR
  Filled 2015-05-01: qty 1

## 2015-05-01 NOTE — Telephone Encounter (Signed)
Patient was ordered to have physical therapy, however patient's daughter requested not to [place]  In [facility]. [facility] preferred [facility].

## 2015-05-01 NOTE — Progress Notes (Signed)
[facility] day: 05/01/2015  Chief Complaint: [name] is a 81 y.o. female with thrombocytopenia, persistent monocytosis, and B12 deficiency who is seen for 2 month assessment.  HPI: The patient  was last seen in the medical oncology clinic on 03/10/2015.  At that time, she was getting stronger.  Her performance status was improving.  Platelet count was improving (108,000) from the prior month (90,000).  Absolute monocyte count remained elevated (1500) most likley secondary to a myelodysplastic/myeloproliferative disorder.  She has been receiving her B12 weekly.  She completed her 6 weekly injections for documented B12 deficiency (02/10/2015 - 03/24/2015).  She states that she feels better for a few days after her B12 injection.    Symptomatically, she is tired all the time.  She does not feel sick. She describes being on an antibiotic (? Septra) for 5 days.  She denies any bruising or bleeding.  She is eating well.  Past Medical History  Diagnosis Date  . HTN (hypertension)   . Syncope   . Hyperlipidemia   . Ischemic colitis ([place])   . Thrombocytopenia ([place])   . Mobitz type 2 second degree heart block   . Anemia   . Pacemaker   . Arthritis   . Arrhythmia   . Atrial fibrillation ([place])   . Stroke [facility])     Past Surgical History  Procedure Laterality Date  . Left hip replacement     . Cholecystectomy    . Pacemaker placement      Family History  Problem Relation Age of Onset  . Stroke Mother   . Alcoholism Father   . Hypertension Sister   . Prostate cancer Neg Hx   . Bladder Cancer Neg Hx   . Kidney cancer Neg Hx     Social History:  reports that she has never smoked. She has never used smokeless tobacco. She reports that she drinks about 0.6 oz of alcohol per week. She reports that she does not use illicit drugs.  The patient is accompanied by her 2 daughters Tomi Bamberger and Coralyn Mark) today.  Allergies:  Allergies  Allergen  Reactions  . Atenolol Other (See Comments)    Reaction:  Complete Heart Block  . Penicillins Other (See Comments)    rash  . Dilaudid [Hydromorphone Hcl] Rash    Current Medications: Current Outpatient Prescriptions  Medication Sig Dispense Refill  . aspirin EC 81 MG tablet Take 81 mg by mouth daily.    . carvedilol (COREG) 12.5 MG tablet Take 12.5 mg by mouth 2 (two) times daily.    . furosemide (LASIX) 20 MG tablet Take 10 mg by mouth daily as needed for edema.     . hydrALAZINE (APRESOLINE) 10 MG tablet Take 10 mg by mouth 2 (two) times daily.     Marland Kitchen HYDROcodone-acetaminophen (NORCO/VICODIN) 5-325 MG per tablet Take 1 tablet by mouth 4 (four) times daily as needed for moderate pain.    Marland Kitchen latanoprost (XALATAN) 0.005 % ophthalmic solution Place 1 drop into both eyes at bedtime.    Marland Kitchen losartan (COZAAR) 100 MG tablet Take 50 mg by mouth 2 (two) times daily.    . Multiple Vitamin (MULTIVITAMIN WITH MINERALS) TABS tablet Take 1 tablet by mouth daily.    . Multiple Vitamins-Minerals (PRESERVISION AREDS 2) CAPS Take 2 capsules by mouth daily.    . Omega-3 Fatty Acids (FISH OIL) 1000 MG CAPS Take 2,000 mg by mouth daily.     Marland Kitchen ZINC OXIDE,  TOPICAL, 10 % CREA Apply 1 application topically 2 (two) times daily. To affected area 113 g 3   No current facility-administered medications for this visit.    Review of Systems:  GENERAL:  Feels tired all of the time.  No fevers, sweats or weight loss. PERFORMANCE STATUS (ECOG):  2 HEENT:  No visual changes, runny nose, sore throat, mouth sores or tenderness. Lungs: Shortness of breath with exertion.  No cough.  No hemoptysis. Cardiac:  No chest pain, palpitations, orthopnea, or PND. GI:  Eating well.  No nausea, vomiting, diarrhea, constipation, melena or hematochezia. GU:  Urinary retention with Foley catheter.  No urgency, frequency, dysuria, or hematuria. Musculoskeletal:  Back and hip pain.  No muscle tenderness. Extremities:  No pain or  swelling. Skin:  No rashes or skin changes. Neuro:  No headache, numbness or weakness, balance or coordination issues. Endocrine:  No diabetes, thyroid issues, hot flashes or night sweats. Psych:  No mood changes, depression or anxiety. Pain:  No focal pain. Review of systems:  All other systems reviewed and found to be negative.  Physical Exam: Blood pressure 167/79, pulse 81, temperature 98.3 F (36.8 C), temperature source Tympanic, resp. rate 18, height $RemoveBe'5\' 1"'[id number]$  (1.549 m), weight 117 lb 8.1 oz (53.3 kg), SpO2 97 %. GENERAL:  Thin elderly woman sitting comfortably in a wheelchair in the exam room in no acute distress. MENTAL STATUS:  Alert and oriented to person, place and time. HEAD:  Short blonde hair.  Normocephalic, atraumatic, face symmetric, no Cushingoid features. EYES:  Glasses.  Brown eyes.  Pupils equal round and reactive to light and accomodation.  No conjunctivitis or scleral icterus. ENT:  Oropharynx clear without lesion. Dentures. Tongue normal. Mucous membranes moist.  RESPIRATORY:  Clear to auscultation without rales, wheezes or rhonchi. CARDIOVASCULAR:  Regular rate and rhythm without murmur, rub or gallop. ABDOMEN:  Soft, non-tender, with active bowel sounds, and no hepatosplenomegaly.  No masses. GENITOURINARY:  Foley catheter in place. SKIN:  No rashes, ulcers or lesions. EXTREMITIES: No edema, no skin discoloration or tenderness.  No palpable cords. LYMPH NODES: No palpable cervical, supraclavicular, axillary or inguinal adenopathy  NEUROLOGICAL: Unremarkable. PSYCH:  Appropriate.  Appointment on 05/01/2015  Component Date Value Ref Range Status  . WBC 05/01/2015 7.9  3.6 - 11.0 K/uL Final  . RBC 05/01/2015 3.44* 3.80 - 5.20 MIL/uL Final  . Hemoglobin 05/01/2015 9.9* 12.0 - 16.0 g/dL Final  . HCT 05/01/2015 29.7* 35.0 - 47.0 % Final  . MCV 05/01/2015 86.3  80.0 - 100.0 fL Final  . MCH 05/01/2015 28.8  26.0 - 34.0 pg Final  . MCHC 05/01/2015 33.4  32.0 - 36.0  g/dL Final  . RDW 05/01/2015 14.3  11.5 - 14.5 % Final  . Platelets 05/01/2015 106* 150 - 440 K/uL Final  . Neutrophils Relative % 05/01/2015 63   Final  . Neutro Abs 05/01/2015 5.0  1.4 - 6.5 K/uL Final  . Lymphocytes Relative 05/01/2015 16   Final  . Lymphs Abs 05/01/2015 1.3  1.0 - 3.6 K/uL Final  . Monocytes Relative 05/01/2015 19   Final  . Monocytes Absolute 05/01/2015 1.5* 0.2 - 0.9 K/uL Final  . Eosinophils Relative 05/01/2015 1   Final  . Eosinophils Absolute 05/01/2015 0.0  0 - 0.7 K/uL Final  . Basophils Relative 05/01/2015 1   Final  . Basophils Absolute 05/01/2015 0.0  0 - 0.1 K/uL Final  . Sodium 05/01/2015 128* 135 - 145 mmol/L Final  . Potassium 05/01/2015  4.2  3.5 - 5.1 mmol/L Final  . Chloride 05/01/2015 97* 101 - 111 mmol/L Final  . CO2 05/01/2015 25  22 - 32 mmol/L Final  . Glucose, Bld 05/01/2015 147* 65 - 99 mg/dL Final  . BUN 05/01/2015 12  6 - 20 mg/dL Final  . Creatinine, Ser 05/01/2015 0.91  0.44 - 1.00 mg/dL Final  . Calcium 05/01/2015 8.6* 8.9 - 10.3 mg/dL Final  . Total Protein 05/01/2015 7.1  6.5 - 8.1 g/dL Final  . Albumin 05/01/2015 3.1* 3.5 - 5.0 g/dL Final  . AST 05/01/2015 14* 15 - 41 U/L Final  . ALT 05/01/2015 11* 14 - 54 U/L Final  . Alkaline Phosphatase 05/01/2015 93  38 - 126 U/L Final  . Total Bilirubin 05/01/2015 0.5  0.3 - 1.2 mg/dL Final  . GFR calc non Af Amer 05/01/2015 54* >60 mL/min Final  . GFR calc Af Amer 05/01/2015 >60  >60 mL/min Final   Comment: (NOTE) The eGFR has been calculated using the CKD EPI equation. This calculation has not been validated in all clinical situations. eGFR's persistently <60 mL/min signify possible Chronic Kidney Disease.   . Anion gap 05/01/2015 6  5 - 15 Final    Assessment:  Deshon Koslowski is a 81 y.o. 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  She has a history of recurrent E coli UTI and associated bacteremia. She has a history of thrombocytopenia secondary to  consumption associated with infection. She has poor marrow reserve secondary to her age.She has been treated with Septra, known myelosuppressant agent.  Work-up on 02/03/2015 revealed a low normal B12 (284) with an elevated MMA (390) consistent with a B12 deficiency.  Folate was normal (20.2) on 02/03/2015.  SPEP on 02/03/2015 revealed no monoclonal protein.  Free light chains were negative.  ANA was negative. Coagulation studies were normal.  Creatinine was 0.87.  She began B12 supplimentation on 02/17/2015.  Peripheral smear on 02/02/2015 revealed marked thrombocytopenia, scattered teardrop cells, and rare schistocytes. BCR-ABL on 02/05/2015 was negative, ruling out CML.  Flow cytometry on 02/05/2015 revealed absolute monocytosis with phenotypic aberrancies.  There was dim partial aberrant upregulation of CD56 and mild down-regulation of CD11b, CD11c and HLA-DR. Changes can be associated with reactive monocytosis and  myelodysplastic/myeloproliferative disorders.    Symptomatically, she feels weak.  Her performance status is modest.  Platelet count is improving stable (106,000)   Plan: 1.  Labs today:  CBC with diff, CMP. 2.  Discuss underlying diagnosis of probable CMML.  Discuss ongoing supportive care. 3.  B12 today and monthly. 4.  RTC in 3 months for MD assess, labs (CBC with diff) + B12   Lequita Asal, MD  05/01/2015

## 2015-05-01 NOTE — Telephone Encounter (Signed)
Called pt's daughter [name] about pt's low sodium of 128.  [name] verbalized that mother drinks a lot of water and will now encourage fluids with sodium such as gatorade.  PCP aware from visit last week and they follow up again on 12/19.  Pts daughter verbalized an understanding of the need to increase sodium in foods and drinks.

## 2015-05-01 NOTE — Telephone Encounter (Signed)
   Please let patient/family know that she had a UTI and was on the appropriate antibiotic     LMOM-cultures positive on appropriate medication

## 2015-05-02 NOTE — Telephone Encounter (Signed)
[facility] to [facility].

## 2015-05-02 NOTE — Telephone Encounter (Signed)
Please advise 

## 2015-05-03 ENCOUNTER — Telehealth: Payer: Self-pay | Admitting: Family Medicine

## 2015-05-03 NOTE — Telephone Encounter (Signed)
Pt daughter called about home health [facility] she states they don't use them they use [facility] [phone]. Fax [phone]. Order is needed to go to [place]. Thank you!

## 2015-05-04 DIAGNOSIS — H40003 Preglaucoma, unspecified, bilateral: Secondary | ICD-10-CM | POA: Diagnosis not present

## 2015-05-09 NOTE — Telephone Encounter (Signed)
Order can be changed to home health through [facility]. Please let me know when he re-put the order in.

## 2015-05-10 DIAGNOSIS — S8991XD Unspecified injury of right lower leg, subsequent encounter: Secondary | ICD-10-CM | POA: Diagnosis not present

## 2015-05-10 DIAGNOSIS — A499 Bacterial infection, unspecified: Secondary | ICD-10-CM | POA: Diagnosis not present

## 2015-05-10 DIAGNOSIS — W06XXXD Fall from bed, subsequent encounter: Secondary | ICD-10-CM | POA: Diagnosis not present

## 2015-05-10 DIAGNOSIS — L89321 Pressure ulcer of left buttock, stage 1: Secondary | ICD-10-CM | POA: Diagnosis not present

## 2015-05-10 DIAGNOSIS — N390 Urinary tract infection, site not specified: Secondary | ICD-10-CM | POA: Diagnosis not present

## 2015-05-10 DIAGNOSIS — M545 Low back pain: Secondary | ICD-10-CM | POA: Diagnosis not present

## 2015-05-11 ENCOUNTER — Ambulatory Visit (INDEPENDENT_AMBULATORY_CARE_PROVIDER_SITE_OTHER): Payer: Medicare Other

## 2015-05-11 DIAGNOSIS — Z23 Encounter for immunization: Secondary | ICD-10-CM

## 2015-05-16 DIAGNOSIS — N390 Urinary tract infection, site not specified: Secondary | ICD-10-CM | POA: Diagnosis not present

## 2015-05-16 DIAGNOSIS — L89321 Pressure ulcer of left buttock, stage 1: Secondary | ICD-10-CM | POA: Diagnosis not present

## 2015-05-16 DIAGNOSIS — S8991XD Unspecified injury of right lower leg, subsequent encounter: Secondary | ICD-10-CM | POA: Diagnosis not present

## 2015-05-16 DIAGNOSIS — W06XXXD Fall from bed, subsequent encounter: Secondary | ICD-10-CM | POA: Diagnosis not present

## 2015-05-16 DIAGNOSIS — A499 Bacterial infection, unspecified: Secondary | ICD-10-CM | POA: Diagnosis not present

## 2015-05-16 DIAGNOSIS — M545 Low back pain: Secondary | ICD-10-CM | POA: Diagnosis not present

## 2015-05-19 ENCOUNTER — Encounter: Payer: Self-pay | Admitting: Sports Medicine

## 2015-05-19 ENCOUNTER — Ambulatory Visit (INDEPENDENT_AMBULATORY_CARE_PROVIDER_SITE_OTHER): Payer: Medicare Other | Admitting: Sports Medicine

## 2015-05-19 ENCOUNTER — Telehealth: Payer: Self-pay | Admitting: Urology

## 2015-05-19 VITALS — BP 159/82 | HR 79

## 2015-05-19 DIAGNOSIS — B351 Tinea unguium: Secondary | ICD-10-CM

## 2015-05-19 DIAGNOSIS — M2040 Other hammer toe(s) (acquired), unspecified foot: Secondary | ICD-10-CM

## 2015-05-19 DIAGNOSIS — N390 Urinary tract infection, site not specified: Secondary | ICD-10-CM | POA: Diagnosis not present

## 2015-05-19 DIAGNOSIS — M79675 Pain in left toe(s): Secondary | ICD-10-CM | POA: Diagnosis not present

## 2015-05-19 DIAGNOSIS — M545 Low back pain: Secondary | ICD-10-CM | POA: Diagnosis not present

## 2015-05-19 DIAGNOSIS — M79674 Pain in right toe(s): Secondary | ICD-10-CM | POA: Diagnosis not present

## 2015-05-19 DIAGNOSIS — M21619 Bunion of unspecified foot: Secondary | ICD-10-CM | POA: Diagnosis not present

## 2015-05-19 DIAGNOSIS — A499 Bacterial infection, unspecified: Secondary | ICD-10-CM | POA: Diagnosis not present

## 2015-05-19 DIAGNOSIS — W06XXXD Fall from bed, subsequent encounter: Secondary | ICD-10-CM | POA: Diagnosis not present

## 2015-05-19 DIAGNOSIS — L89321 Pressure ulcer of left buttock, stage 1: Secondary | ICD-10-CM | POA: Diagnosis not present

## 2015-05-19 DIAGNOSIS — S8991XD Unspecified injury of right lower leg, subsequent encounter: Secondary | ICD-10-CM | POA: Diagnosis not present

## 2015-05-19 NOTE — Progress Notes (Signed)
Patient ID: [name], female   DOB: 29-Jan-1923, 81 y.o.   MRN: [id number] Subjective: [name] is a 81 y.o. female patient seen today in office with complaint of painful thickened and elongated toenails; unable to trim. Patient denies history of Diabetes, Neuropathy, or known  Vascular disease. Patient has no other pedal complaints at this time.   Review of Systems  All other systems reviewed and are negative.  Patient Active Problem List   Diagnosis Date Noted  . Anemia 05/01/2015  . Right knee injury 04/25/2015  . Low back pain 04/25/2015  . Ischemic colitis ([place]) 04/20/2015  . Bad odor of urine 03/17/2015  . Stage I pressure ulcer of left buttock 03/13/2015  . Artificial cardiac pacemaker 03/03/2015  . HLD (hyperlipidemia) 03/03/2015  . BP (high blood pressure) 03/03/2015  . Colitis, ischemic ([place]) 03/03/2015  . Candidal intertrigo, inguinal 03/01/2015  . Essential hypertension 03/01/2015  . Monocytosis 02/09/2015  . Thrombocytopenia ([place]) 02/09/2015  . B12 deficiency 02/09/2015  . Urinary retention 02/09/2015  . Pressure ulcer of left coccygeal region, stage 2 02/09/2015  . Bacteremia 01/12/2015  . UTI (lower urinary tract infection) 01/12/2015  . Acquired complete AV block ([place]) 06/22/2012  . Supraventricular tachycardia ([place]) 06/22/2012  . Complete atrioventricular block ([place]) 06/22/2012   Current Outpatient Prescriptions on File Prior to Visit  Medication Sig Dispense Refill  . aspirin EC 81 MG tablet Take 81 mg by mouth daily.    . carvedilol (COREG) 12.5 MG tablet Take 12.5 mg by mouth 2 (two) times daily.    . furosemide (LASIX) 20 MG tablet Take 10 mg by mouth daily as needed for edema.     . hydrALAZINE (APRESOLINE) 10 MG tablet Take 10 mg by mouth 2 (two) times daily.     Marland Kitchen HYDROcodone-acetaminophen (NORCO/VICODIN) 5-325 MG per tablet Take 1 tablet by mouth 4 (four) times daily as needed for moderate pain.    Marland Kitchen latanoprost (XALATAN) 0.005 % ophthalmic solution  Place 1 drop into both eyes at bedtime.    Marland Kitchen losartan (COZAAR) 100 MG tablet Take 50 mg by mouth 2 (two) times daily.    . Multiple Vitamin (MULTIVITAMIN WITH MINERALS) TABS tablet Take 1 tablet by mouth daily.    . Multiple Vitamins-Minerals (PRESERVISION AREDS 2) CAPS Take 2 capsules by mouth daily.    . Omega-3 Fatty Acids (FISH OIL) 1000 MG CAPS Take 2,000 mg by mouth daily.     Marland Kitchen ZINC OXIDE, TOPICAL, 10 % CREA Apply 1 application topically 2 (two) times daily. To affected area 113 g 3   No current facility-administered medications on file prior to visit.      Allergies  Allergen Reactions  . Atenolol Other (See Comments)    Reaction:  Complete Heart Block  . Penicillins Other (See Comments)    rash  . Dilaudid [Hydromorphone Hcl] Rash  . Hydromorphone Rash     Objective: Physical Exam  General: Well developed, nourished, no acute distress, awake, alert and oriented x 3. Walker assisted gait.   Vascular: Dorsalis pedis artery 1/4 bilateral, Posterior tibial artery 1/4 bilateral, skin temperature warm to warm proximal to distal bilateral lower extremities, + varicosities, scant pedal hair present bilateral.  Neurological: Gross sensation present via light touch bilateral.   Dermatological: Skin is warm, dry, and supple bilateral, Nails 1-10 are tender, long, thick, and discolored with mild subungal debris, no webspace macerations present bilateral, no open lesions present bilateral, no callus/corns/hyperkeratotic tissue present bilateral. No signs of infection  bilateral.  Musculoskeletal: Asymptomatic bunion and hammertoe boney deformities noted bilateral. Muscular strength within normal limits without pain on range of motion. No pain with calf compression bilateral.  Assessment and Plan:  Problem List Items Addressed This Visit    None    Visit Diagnoses    Dermatophytosis of nail    -  Primary    Pain in toes of both feet        Bunion        Hammer toe, unspecified  laterality          -Examined patient.  -Discussed treatment options for painful mycotic nails. -Mechanically debrided and reduced mycotic nails with sterile nail nipper and dremel nail file without incident. -Recommend good supportive shoes for foot type. -Patient to return in 3 months for follow up evaluation or sooner if symptoms worsen.  Landis Martins, DPM

## 2015-05-19 NOTE — Telephone Encounter (Signed)
Pt's daughter called.  Pt's cath bag is leaking.  Please call [phone].

## 2015-05-19 NOTE — Progress Notes (Deleted)
   Subjective:    Patient ID: [name], female    DOB: May 08, 1923, 81 y.o.   MRN: [id number]  HPI    Review of Systems  All other systems reviewed and are negative.      Objective:   Physical Exam        Assessment & Plan:

## 2015-05-19 NOTE — Telephone Encounter (Signed)
LMOM

## 2015-05-22 ENCOUNTER — Encounter: Payer: Self-pay | Admitting: Family Medicine

## 2015-05-22 ENCOUNTER — Ambulatory Visit (INDEPENDENT_AMBULATORY_CARE_PROVIDER_SITE_OTHER): Payer: Medicare Other | Admitting: Family Medicine

## 2015-05-22 VITALS — BP 124/78 | HR 72 | Temp 98.2°F | Ht 61.0 in | Wt 116.8 lb

## 2015-05-22 DIAGNOSIS — Z9660 Presence of unspecified orthopedic joint implant: Secondary | ICD-10-CM | POA: Diagnosis not present

## 2015-05-22 DIAGNOSIS — L89322 Pressure ulcer of left buttock, stage 2: Secondary | ICD-10-CM | POA: Diagnosis not present

## 2015-05-22 DIAGNOSIS — Z96642 Presence of left artificial hip joint: Secondary | ICD-10-CM | POA: Insufficient documentation

## 2015-05-22 NOTE — Progress Notes (Signed)
Patient ID: [name], female   DOB: 1922/08/20, 81 y.o.   MRN: [id number]   Tommi Rumps, MD Phone: [phone]  [name] is a 81 y.o. female who presents today for follow-up.  Pressure ulcers: Patient and caregivers note that she has one large pressure ulcer on her left gluteal cleft that now has break in the skin. She has 3 smaller ones as well. Notes some mild purplish hue to the skin as well. They're not draining. They're not painful. She is not having fevers. She does note that she washed them with a washcloth recently and was overly vigorous and cause more irritation. They stopped the cream as well. Not using the pressure pillow as she does not like it. She does not want a new mattress.  They're also requesting a new walker. Patient had a left hip replacement about 8 months ago. She notes she has been seeing physical therapy and they want her to use walker. It helps her maintain her balance. She wants a walker now with bigger wheels that will allow her to get around easier.Small wheels at this time get caught easily.  PMH: nonsmoker.   ROS see history of present illness  Objective  Physical Exam Filed Vitals:   05/22/15 1404  BP: 124/78  Pulse: 72  Temp: 98.2 F (36.8 C)   Physical Exam  Constitutional: No distress.  HENT:  Head: Normocephalic and atraumatic.  Cardiovascular: Normal rate, regular rhythm and normal heart sounds.  Exam reveals no gallop and no friction rub.   No murmur heard. Pulmonary/Chest: Effort normal and breath sounds normal. No respiratory distress. She has no wheezes. She has no rales.  Musculoskeletal:  Bilateral hips with no tenderness, rotation externally and internally with no discomfort  Skin: She is not diaphoretic.  Left gluteal cleft with about 1 cm diameter break in the skin surrounded by skin with purplish hue from the top of the gluteal cleft to the middle the gluteal cleft, there is no induration or warmth to this, there is no  tenderness, there is no drainage, small area and right gluteal cleft midportion with area of purplish hue with no induration, warmth, or tenderness or drainage     Assessment/Plan: Please see individual problem list.  Status post left hip replacement Appears to be doing moderately well this time. We'll write prescription for DME equipment with walker with larger wheels. We'll continue physical therapy.  Stage II pressure ulcer of left buttock Area now appears to be a stage II pressure ulcer. Does have what appears to be stage I pressure ulcer forming on right gluteal cleft. These areas do not appear infected. Again discussed offloading these areas when patient is laying down or sitting down. Discussed obtaining a hospital bed with a special mattress, though the patient declined this. Daughter reports previously used duo derm to provide padding and a prescription was given for this. She'll be referred back to the wound center for further evaluation. Given return precautions.    Orders Placed This Encounter  Procedures  . AMB referral to wound care center    Referral Priority:  Routine    Referral Type:  Consultation    Number of Visits Requested:  1    Dragon voice recognition software was used during the dictation process of this note. If any phrases or words seem inappropriate it is likely secondary to the translation process being inefficient.  Tommi Rumps

## 2015-05-22 NOTE — Telephone Encounter (Signed)
Pt daughter came by and picked up an extra bag.

## 2015-05-22 NOTE — Progress Notes (Signed)
Pre visit review using our clinic review tool, if applicable. No additional management support is needed unless otherwise documented below in the visit note. 

## 2015-05-22 NOTE — Patient Instructions (Signed)
Nice to see you. We will refer you back to the wound center. Have written a prescription for DuoDerm patch to apply to the bedsores. Please monitor these. If they develop drainage or increasing redness or she develops fever or feels poorly she needs to be evaluated. Have also given a prescription for a walker. If you develop fever, erythema, drainage, pain, or feels poorly or has new or changing symptoms please seek medical attention.

## 2015-05-23 ENCOUNTER — Encounter: Payer: Self-pay | Admitting: Family Medicine

## 2015-05-23 DIAGNOSIS — A499 Bacterial infection, unspecified: Secondary | ICD-10-CM | POA: Diagnosis not present

## 2015-05-23 DIAGNOSIS — S8991XD Unspecified injury of right lower leg, subsequent encounter: Secondary | ICD-10-CM | POA: Diagnosis not present

## 2015-05-23 DIAGNOSIS — N390 Urinary tract infection, site not specified: Secondary | ICD-10-CM | POA: Diagnosis not present

## 2015-05-23 DIAGNOSIS — L89321 Pressure ulcer of left buttock, stage 1: Secondary | ICD-10-CM | POA: Diagnosis not present

## 2015-05-23 DIAGNOSIS — M545 Low back pain: Secondary | ICD-10-CM | POA: Diagnosis not present

## 2015-05-23 DIAGNOSIS — W06XXXD Fall from bed, subsequent encounter: Secondary | ICD-10-CM | POA: Diagnosis not present

## 2015-05-23 NOTE — Assessment & Plan Note (Signed)
Appears to be doing moderately well this time. We'll write prescription for DME equipment with walker with larger wheels. We'll continue physical therapy.

## 2015-05-23 NOTE — Assessment & Plan Note (Signed)
Area now appears to be a stage II pressure ulcer. Does have what appears to be stage I pressure ulcer forming on right gluteal cleft. These areas do not appear infected. Again discussed offloading these areas when patient is laying down or sitting down. Discussed obtaining a hospital bed with a special mattress, though the patient declined this. Daughter reports previously used duo derm to provide padding and a prescription was given for this. She'll be referred back to the wound center for further evaluation. Given return precautions.

## 2015-05-25 ENCOUNTER — Ambulatory Visit (INDEPENDENT_AMBULATORY_CARE_PROVIDER_SITE_OTHER): Payer: Medicare Other | Admitting: Urology

## 2015-05-25 ENCOUNTER — Encounter: Payer: Self-pay | Admitting: Urology

## 2015-05-25 VITALS — BP 145/85 | HR 81 | Ht 61.0 in | Wt 113.9 lb

## 2015-05-25 DIAGNOSIS — R339 Retention of urine, unspecified: Secondary | ICD-10-CM

## 2015-05-25 NOTE — Progress Notes (Signed)
05/25/2015 11:11 AM   Chauncey Mann 02-Aug-1922 [id number]  Referring provider: Leone Haven, MD [address] [place], [place] [address]  Chief Complaint  Patient presents with  . Follow-up    cath change     HPI: The patient is a 81 year old female who presents after 550 cc urinary retention. She is also recurrent UTIs over the last 6 weeks. She has been told that the UTIs were from her inability to empty the bladder. She's not have issues with UTIs prior to this time however she had frequency, hesitancy, nocturia 4, incontinence, intermittency, and weak stream. She denies hematuria and kidney stones.   November 2016 Interval history: The patient returns for catheter change. Over the last days she developed a fever the level over 101. She was seen in the ER but not started on antibiotics. Her family states that fever is usually her first and only symptom of urinary tract infection. She also has been weaker and lethargic more so than baseline the last 2 days.    December 2016 interval history: The patient was treated with appropriate antibiotics for her urinary tract infection which was Escherichia coli. She's had no urinary tract infections with symptoms since that time. She's had no changes in mental status, fevers, or unexplained feeling of weakness.   PMH: Past Medical History  Diagnosis Date  . HTN (hypertension)   . Syncope   . Hyperlipidemia   . Ischemic colitis ([place])   . Thrombocytopenia ([place])   . Mobitz type 2 second degree heart block   . Anemia   . Pacemaker   . Arthritis   . Arrhythmia   . Atrial fibrillation ([place])   . Stroke [facility])     Surgical History: Past Surgical History  Procedure Laterality Date  . Left hip replacement     . Cholecystectomy    . Pacemaker placement      Home Medications:    Medication List       This list is accurate as of: 05/25/15 11:11 AM.  Always use your most recent med list.               aspirin  EC 81 MG tablet  Take 81 mg by mouth daily.     carvedilol 12.5 MG tablet  Commonly known as:  COREG  Take 12.5 mg by mouth 2 (two) times daily.     Fish Oil 1000 MG Caps  Take 2,000 mg by mouth daily.     furosemide 20 MG tablet  Commonly known as:  LASIX  Take 10 mg by mouth daily as needed for edema.     hydrALAZINE 10 MG tablet  Commonly known as:  APRESOLINE  Take 10 mg by mouth 2 (two) times daily.     HYDROcodone-acetaminophen 5-325 MG tablet  Commonly known as:  NORCO/VICODIN  Take 1 tablet by mouth 4 (four) times daily as needed for moderate pain.     latanoprost 0.005 % ophthalmic solution  Commonly known as:  XALATAN  Place 1 drop into both eyes at bedtime.     losartan 100 MG tablet  Commonly known as:  COZAAR  Take 50 mg by mouth 2 (two) times daily.     multivitamin with minerals Tabs tablet  Take 1 tablet by mouth daily.     PRESERVISION AREDS 2 Caps  Take 2 capsules by mouth daily.     ZINC OXIDE (TOPICAL) 10 % Crea  Apply 1 application topically 2 (two) times  daily. To affected area        Allergies:  Allergies  Allergen Reactions  . Atenolol Other (See Comments)    Reaction:  Complete Heart Block  . Penicillins Other (See Comments)    rash  . Dilaudid [Hydromorphone Hcl] Rash  . Hydromorphone Rash    Family History: Family History  Problem Relation Age of Onset  . Stroke Mother   . Alcoholism Father   . Hypertension Sister   . Prostate cancer Neg Hx   . Bladder Cancer Neg Hx   . Kidney cancer Neg Hx     Social History:  reports that she has never smoked. She has never used smokeless tobacco. She reports that she drinks about 0.6 oz of alcohol per week. She reports that she does not use illicit drugs.  ROS: UROLOGY Frequent Urination?: No Hard to postpone urination?: No Burning/pain with urination?: No Get up at night to urinate?: No Leakage of urine?: No Urine stream starts and stops?: No Trouble starting stream?: No Do you  have to strain to urinate?: No Blood in urine?: No Urinary tract infection?: No Sexually transmitted disease?: No Injury to kidneys or bladder?: No Painful intercourse?: No Weak stream?: No Currently pregnant?: No Vaginal bleeding?: No Last menstrual period?: No  Gastrointestinal Nausea?: No Vomiting?: No Indigestion/heartburn?: No Diarrhea?: No Constipation?: No  Constitutional Fever: No Night sweats?: No Weight loss?: No Fatigue?: No  Skin Skin rash/lesions?: No Itching?: No  Eyes Blurred vision?: No Double vision?: No  Ears/Nose/Throat Sore throat?: No Sinus problems?: No  Hematologic/Lymphatic Swollen glands?: No Easy bruising?: No  Cardiovascular Leg swelling?: No Chest pain?: No  Respiratory Cough?: No Shortness of breath?: No  Endocrine Excessive thirst?: No  Musculoskeletal Joint pain?: No  Neurological Headaches?: No Dizziness?: No  Psychologic Depression?: No Anxiety?: No  Physical Exam: BP 145/85 mmHg  Pulse 81  Ht 5\' 1"  (1.549 m)  Wt 113 lb 14.4 oz (51.665 kg)  BMI 21.53 kg/m2  Constitutional:  Alert and oriented, No acute distress. HEENT: [place] AT, moist mucus membranes.  Trachea midline, no masses. Cardiovascular: No clubbing, cyanosis, or edema. Respiratory: Normal respiratory effort, no increased work of breathing. GI: Abdomen is soft, nontender, nondistended, no abdominal masses GU: No CVA tenderness.  Skin: No rashes, bruises or suspicious lesions. Lymph: No cervical or inguinal adenopathy. Neurologic: Grossly intact, no focal deficits, moving all 4 extremities. Psychiatric: Normal mood and affect.  Laboratory Data: Lab Results  Component Value Date   WBC 7.9 05/01/2015   HGB 9.9* 05/01/2015   HCT 29.7* 05/01/2015   MCV 86.3 05/01/2015   PLT 106* 05/01/2015    Lab Results  Component Value Date   CREATININE 0.91 05/01/2015    No results found for: PSA  No results found for: TESTOSTERONE  No results found  for: HGBA1C  Urinalysis    Component Value Date/Time   COLORURINE STRAW* 02/09/2015 [place] 01/13/2013 1238   APPEARANCEUR CLEAR* 02/09/2015 [place] 01/13/2013 1238   LABSPEC 1.003* 02/09/2015 1722   LABSPEC 1.005 01/13/2013 1238   PHURINE 6.0 02/09/2015 1722   PHURINE 6.0 01/13/2013 1238   GLUCOSEU NEGATIVE 02/09/2015 1722   GLUCOSEU NEGATIVE 01/13/2013 1238   HGBUR NEGATIVE 02/09/2015 1722   HGBUR 2+ 01/13/2013 1238   BILIRUBINUR small 03/17/2015 [place] 02/09/2015 1722   BILIRUBINUR NEGATIVE 01/13/2013 1238   KETONESUR NEGATIVE 02/09/2015 1722   KETONESUR 1+ 01/13/2013 1238   PROTEINUR trace 03/17/2015 1359  PROTEINUR NEGATIVE 02/09/2015 1722   PROTEINUR NEGATIVE 01/13/2013 1238   UROBILINOGEN 0.2 03/17/2015 1359   NITRITE positive 03/17/2015 1359   NITRITE NEGATIVE 02/09/2015 1722   NITRITE POSITIVE 01/13/2013 1238   LEUKOCYTESUR large (3+)* 03/17/2015 1359   LEUKOCYTESUR 3+ 01/13/2013 1238     Assessment & Plan:    1. Urinary retention The patient's Foley catheter was exchanged today in the office.  She and her family have been instructed to only treat urinary tract infections that have symptoms. She'll follow-up in the office in one month for Foley catheter exchange the nursing staff.  Return in about 4 weeks (around 06/22/2015) for with nurse for foley exchange.  Nickie Retort, MD  [facility] [address] [place], [place] [address] [phone]

## 2015-05-25 NOTE — Progress Notes (Signed)
Cath Change/ Replacement  Patient is present today for a catheter change due to urinary retention. 46ml of water was removed from the balloon, a 16 FR foley cath was removed with out difficulty.  Patient was cleaned and prepped in a sterile fashion with betadine and 2% lidocaine jelly was instilled into the urethra. A 16 FR foley cath was replaced into the bladder no complications were noted Urine return was noted and urine was yellow in color. The balloon was filled with 31ml of sterile water. A night bag was attached for drainage.  A night bag was also given to the patient and patient was given instruction on how to change from one bag to another. Patient was given proper instruction on catheter care.    Preformed by: K.Ruthanne Mcneish,CMA

## 2015-05-30 ENCOUNTER — Other Ambulatory Visit: Payer: Self-pay | Admitting: Hematology and Oncology

## 2015-05-30 DIAGNOSIS — M545 Low back pain: Secondary | ICD-10-CM | POA: Diagnosis not present

## 2015-05-30 DIAGNOSIS — A499 Bacterial infection, unspecified: Secondary | ICD-10-CM | POA: Diagnosis not present

## 2015-05-30 DIAGNOSIS — L89321 Pressure ulcer of left buttock, stage 1: Secondary | ICD-10-CM | POA: Diagnosis not present

## 2015-05-30 DIAGNOSIS — W06XXXD Fall from bed, subsequent encounter: Secondary | ICD-10-CM | POA: Diagnosis not present

## 2015-05-30 DIAGNOSIS — N390 Urinary tract infection, site not specified: Secondary | ICD-10-CM | POA: Diagnosis not present

## 2015-05-30 DIAGNOSIS — S8991XD Unspecified injury of right lower leg, subsequent encounter: Secondary | ICD-10-CM | POA: Diagnosis not present

## 2015-05-31 ENCOUNTER — Inpatient Hospital Stay: Payer: Medicare Other | Attending: Hematology and Oncology

## 2015-05-31 ENCOUNTER — Inpatient Hospital Stay: Payer: Medicare Other

## 2015-05-31 DIAGNOSIS — D696 Thrombocytopenia, unspecified: Secondary | ICD-10-CM | POA: Insufficient documentation

## 2015-05-31 DIAGNOSIS — D72821 Monocytosis (symptomatic): Secondary | ICD-10-CM | POA: Insufficient documentation

## 2015-05-31 DIAGNOSIS — Z79899 Other long term (current) drug therapy: Secondary | ICD-10-CM | POA: Diagnosis not present

## 2015-05-31 DIAGNOSIS — E538 Deficiency of other specified B group vitamins: Secondary | ICD-10-CM

## 2015-05-31 LAB — CBC WITH DIFFERENTIAL/PLATELET
Basophils Absolute: 0 10*3/uL (ref 0–0.1)
Basophils Relative: 0 %
Eosinophils Absolute: 0 10*3/uL (ref 0–0.7)
Eosinophils Relative: 1 %
HCT: 31.2 % — ABNORMAL LOW (ref 35.0–47.0)
Hemoglobin: 10.2 g/dL — ABNORMAL LOW (ref 12.0–16.0)
Lymphocytes Relative: 28 %
Lymphs Abs: 1.7 10*3/uL (ref 1.0–3.6)
MCH: 28.2 pg (ref 26.0–34.0)
MCHC: 32.8 g/dL (ref 32.0–36.0)
MCV: 86 fL (ref 80.0–100.0)
Monocytes Absolute: 1.4 10*3/uL — ABNORMAL HIGH (ref 0.2–0.9)
Monocytes Relative: 24 %
Neutro Abs: 2.8 10*3/uL (ref 1.4–6.5)
Neutrophils Relative %: 47 %
Platelets: 101 10*3/uL — ABNORMAL LOW (ref 150–440)
RBC: 3.63 MIL/uL — ABNORMAL LOW (ref 3.80–5.20)
RDW: 15.5 % — ABNORMAL HIGH (ref 11.5–14.5)
WBC: 6 10*3/uL (ref 3.6–11.0)

## 2015-05-31 MED ORDER — CYANOCOBALAMIN 1000 MCG/ML IJ SOLN
1000.0000 ug | Freq: Once | INTRAMUSCULAR | Status: AC
  Administered 2015-05-31: 1000 ug via INTRAMUSCULAR
  Filled 2015-05-31: qty 1

## 2015-06-02 ENCOUNTER — Encounter: Payer: Self-pay | Admitting: General Surgery

## 2015-06-02 ENCOUNTER — Encounter: Payer: Medicare Other | Attending: General Surgery | Admitting: General Surgery

## 2015-06-02 DIAGNOSIS — L89311 Pressure ulcer of right buttock, stage 1: Secondary | ICD-10-CM | POA: Diagnosis not present

## 2015-06-02 DIAGNOSIS — I10 Essential (primary) hypertension: Secondary | ICD-10-CM | POA: Diagnosis not present

## 2015-06-02 DIAGNOSIS — L89322 Pressure ulcer of left buttock, stage 2: Secondary | ICD-10-CM

## 2015-06-02 NOTE — Progress Notes (Signed)
seeiheal 

## 2015-06-03 NOTE — Progress Notes (Signed)
FRAVEL, Debbrah ([id number]) Visit Report for 06/02/2015 Chief Complaint Document Details Patient Name: Maudie, Shufford [facility] 06/02/2015 9:30 Date of Service: AM Medical Record [id number] Number: Patient Account Number: [id number] 1922/11/05 (81 y.o. Treating RN: Ahmed Prima Date of Birth/Sex: Female) Other Clinician: Primary Care Physician: Caryl Bis, ERIC Treating Jerline Pain, Raunel Dimartino Referring Physician: Caryl Bis, ERIC Physician/Extender: Suella Grove in Treatment: 0 Information Obtained from: Patient Electronic Signature(s) Signed: 06/02/2015 10:27:46 AM By: Judene Companion MD Entered By: Judene Companion on 06/02/2015 10:27:46 Milner, Marylynne ([id number]) -------------------------------------------------------------------------------- HPI Details Patient Name: Aleida, Schatzle Latishia 06/02/2015 9:30 Date of Service: AM Medical Record [id number] Number: Patient Account Number: [id number] 1922/10/09 (81 y.o. Treating RN: Ahmed Prima Date of Birth/Sex: Female) Other Clinician: Primary Care Physician: Caryl Bis, ERIC Treating Jerline Pain Manvir Prabhu Referring Physician: Caryl Bis, ERIC Physician/Extender: Suella Grove in Treatment: 0 Electronic Signature(s) Signed: 06/02/2015 10:27:57 AM By: Judene Companion MD Entered By: Judene Companion on 06/02/2015 10:27:56 Mitnick, Asal ([id number]) -------------------------------------------------------------------------------- Physical Exam Details Patient Name: Owynn, Benenhaley Jerriyah 06/02/2015 9:30 Date of Service: AM Medical Record [id number] Number: Patient Account Number: [id number] [birth year] (81 y.o. Treating RN: Ahmed Prima Date of Birth/Sex: Female) Other Clinician: Primary Care Physician: Caryl Bis, ERIC Treating Jerline Pain Luismario Coston Referring Physician: Caryl Bis, ERIC Physician/Extender: Suella Grove in Treatment: 0 Electronic Signature(s) Signed: 06/02/2015 10:28:05 AM By: Judene Companion MD Entered By: Judene Companion on 06/02/2015 10:28:05 Fross, Shandrea  ([id number]) -------------------------------------------------------------------------------- Physician Orders Details Patient Name: Eileena, Roadcap Jaryn 06/02/2015 9:30 Date of Service: AM Medical Record [id number] Number: Patient Account Number: [id number] July 17, 1922 (81 y.o. Treating RN: Ahmed Prima Date of Birth/Sex: Female) Other Clinician: Primary Care Physician: Caryl Bis, ERIC Treating Jerline Pain, Benny Deutschman Referring Physician: Caryl Bis, ERIC Physician/Extender: Suella Grove in Treatment: 0 Verbal / Phone Orders: Yes ClinicianCarolyne Fiscal, Debi Read Back and Verified: Yes Diagnosis Coding Wound Cleansing Wound #2 Left Gluteus o Clean wound with Normal Saline. Anesthetic Wound #2 Left Gluteus o Topical Lidocaine 4% cream applied to wound bed prior to debridement Skin Barriers/Peri-Wound Care Wound #2 Left Gluteus o Skin Prep Primary Wound Dressing Wound #2 Left Gluteus o Prisma Ag Secondary Dressing Wound #2 Left Gluteus o Boardered Foam Dressing Dressing Change Frequency Wound #2 Left Gluteus o Change dressing every other day. Follow-up Appointments Wound #2 Left Gluteus o Return Appointment in 2 weeks. Off-Loading Wound #2 Left Gluteus o Turn and reposition every 2 hours Camey, Darrin ([id number]) Electronic Signature(s) Signed: 06/02/2015 1:09:45 PM By: Judene Companion MD Signed: 06/02/2015 1:51:49 PM By: Alric Quan Entered By: Alric Quan on 06/02/2015 10:21:05 Bogdanski, Anuoluwapo ([id number]) -------------------------------------------------------------------------------- Problem List Details Patient Name: Jaza, Onsurez Stefanny 06/02/2015 9:30 Date of Service: AM Medical Record [id number] Number: Patient Account Number: [id number] 1923/02/09 (81 y.o. Treating RN: Ahmed Prima Date of Birth/Sex: Female) Other Clinician: Primary Care Physician: Caryl Bis, ERIC Treating Jerline Pain, Glennette Galster Referring Physician: Caryl Bis, ERIC Physician/Extender: Suella Grove in  Treatment: 0 Active Problems Inactive Problems Resolved Problems Electronic Signature(s) Signed: 06/02/2015 10:27:27 AM By: Judene Companion MD Entered By: Judene Companion on 06/02/2015 10:27:27 Funez, Joelene ([id number]) -------------------------------------------------------------------------------- ROS/PFSH Details Patient Name: Jameika, Hendee Cressie 06/02/2015 9:30 Date of Service: AM Medical Record [id number] Number: Patient Account Number: [id number] 11-14-1922 (81 y.o. Treating RN: Ahmed Prima Date of Birth/Sex: Female) Other Clinician: Primary Care Physician: Caryl Bis, ERIC Treating Jerline Pain, Kemaya Dorner Referring Physician: Caryl Bis, ERIC Physician/Extender: Suella Grove in Treatment: 0 Information Obtained From Patient Wound History Do you currently have one or more open woundso Yes How many open wounds do you currently haveo 1 Approximately how long have you had your woundso 9 months How have you been treating your wound(s) until nowo hydocortisone Has your wound(s) ever healed  and then re-openedo No Have you had any lab work done in the past montho No Have you tested positive for an antibiotic resistant organism (MRSA, VRE)o No Have you tested positive for osteomyelitis (bone infection)o No Have you had any tests for circulation on your legso No Have you had other problems associated with your woundso Swelling Eyes Complaints and Symptoms: Positive for: Glasses / Contacts - glasses Negative for: Vision Changes Medical History: Positive for: Cataracts - hx Genitourinary Complaints and Symptoms: Positive for: Incontinence/dribbling Medical History: Past Medical History Notes: foley catheter in place UTI hx urine retention Psychiatric Complaints and Symptoms: Positive for: Anxiety Luedke, Kaysi ([id number]) Constitutional Symptoms (General Health) Complaints and Symptoms: No Complaints or Symptoms Ear/Nose/Mouth/Throat Medical History: Negative for: Middle ear problems Past  Medical History Notes: HOH - bilateral hearing aids Hematologic/Lymphatic Medical History: Past Medical History Notes: low platelets Respiratory Complaints and Symptoms: No Complaints or Symptoms Cardiovascular Medical History: Positive for: Hypertension Past Medical History Notes: pacemaker Gastrointestinal Complaints and Symptoms: No Complaints or Symptoms Endocrine Complaints and Symptoms: No Complaints or Symptoms Immunological Complaints and Symptoms: No Complaints or Symptoms Musculoskeletal Medical History: Positive for: Osteoarthritis - back and knees Neurologic Berringer, Audryna ([id number]) Complaints and Symptoms: No Complaints or Symptoms Oncologic Complaints and Symptoms: No Complaints or Symptoms Medical History: Negative for: Received Chemotherapy; Received Radiation HBO Extended History Items Eyes: Cataracts Hospitalization / Surgery History Name of Hospital Purpose of Hospitalization/Surgery Date [facility] 02/02/2015 Family and Social History Cancer: Yes - Siblings; Diabetes: No; Heart Disease: Yes - Siblings; Hereditary Spherocytosis: No; Hypertension: Yes - Siblings; Kidney Disease: No; Lung Disease: No; Seizures: No; Stroke: No; Thyroid Problems: No; Tuberculosis: No; Never smoker; Marital Status - Widowed; Alcohol Use: Never; Drug Use: No History; Caffeine Use: Never; Financial Concerns: No; Food, Clothing or Shelter Needs: No; Support System Lacking: No; Transportation Concerns: No; Advanced Directives: Yes (Copy provided); Patient does not want information on Advanced Directives; Living Will: No; Medical Power of Attorney: Yes - Rick Moshe (Copy provided) Electronic Signature(s) Signed: 06/02/2015 1:09:45 PM By: Judene Companion MD Signed: 06/02/2015 1:51:49 PM By: Alric Quan Entered By: Alric Quan on 06/02/2015 09:50:34 Vowels, Chelse ([id number]) -------------------------------------------------------------------------------- [place]  Details Patient Name: Chauncey Mann Date of Service: 06/02/2015 Medical Record Number: [id number] Patient Account Number: [id number] Date of Birth/Sex: 1922/10/20 (81 y.o. Female) Treating RN: Ahmed Prima Primary Care Physician: Caryl Bis, ERIC Other Clinician: Referring Physician: Caryl Bis, ERIC Treating Physician/Extender: Judene Companion Weeks in Treatment: 0 Diagnosis Coding ICD-10 Codes Code Description L89.322 Pressure ulcer of left buttock, stage 2 Facility Procedures CPT4 Code: [id number] Description: 99213 - WOUND CARE VISIT-LEV 3 EST PT Modifier: Quantity: 1 Physician Procedures CPT4 Code: [id number] Description: [id number] - WC PHYS LEVEL 1 EST PT ICD-10 Description Diagnosis L89.322 Pressure ulcer of left buttock, stage 2 Modifier: Quantity: 1 Electronic Signature(s) Signed: 06/02/2015 10:35:12 AM By: Judene Companion MD Entered By: Judene Companion on 06/02/2015 10:35:12

## 2015-06-03 NOTE — Progress Notes (Signed)
[name], [name] Visit Report for 06/02/2015 Allergy List Details Patient Name: [name], [name] Date of Service: 06/02/2015 9:30 AM Medical Record Number: [id number] Patient Account Number: [id number] Date of Birth/Sex: December 19, 1922 (81 y.o. Female) Treating RN: [name] Primary Care Physician: [name], [name] Other Clinician: Referring Physician: Caryl [name], [name] Treating Physician/Extender: [name] Weeks in Treatment: 0 Allergies Active Allergies penicillin atenolol Dilaudid Allergy Notes Electronic Signature(s) Signed: 06/02/2015 1:51:49 PM By: [name] Entered By: [name] on 06/02/2015 09:46:26 [name], [name] -------------------------------------------------------------------------------- Arrival Information Details Patient Name: [name] Date of Service: 06/02/2015 9:30 AM Medical Record Number: [id number] Patient Account Number: [id number] Date of Birth/Sex: 01-Jun-1923 (81 y.o. Female) Treating RN: [name] Primary Care Physician: [name], [name] Other Clinician: Referring Physician: Caryl [name], [name] Treating Physician/Extender: [name] in Treatment: 0 Visit Information Patient Arrived: Walker Arrival Time: 09:40 Accompanied By: [name] Transfer Assistance: None Patient Identification Verified: Yes Secondary Verification Process Yes Completed: Patient Requires Transmission-Based No Precautions: Patient Has Alerts: No History Since Last Visit All ordered tests and consults were completed: No Added or deleted any medications: No Any new allergies or adverse reactions: No Had a fall or experienced change in activities of daily living that may affect risk of falls: No Signs or symptoms of abuse/neglect since last visito No Hospitalized since last visit: No Electronic Signature(s) Signed: 06/02/2015 1:51:49 PM By: [name] Entered By: [name] on 06/02/2015 09:42:15 [name], [name]  ([id number]) -------------------------------------------------------------------------------- Clinic Level of Care Assessment Details Patient Name: [name] Date of Service: 06/02/2015 9:30 AM Medical Record Number: [id number] Patient Account Number: [id number] Date of Birth/Sex: [birth year] (81 y.o. Female) Treating RN: [name] Primary Care Physician: [name], [name] Other Clinician: Referring Physician: Caryl [name], [name] Treating Physician/Extender: [name] in Treatment: 0 Clinic Level of Care Assessment Items TOOL 2 Quantity Score X - Use when only an EandM is performed on the INITIAL visit 1 0 ASSESSMENTS - Nursing Assessment / Reassessment X - General Physical Exam (combine w/ comprehensive assessment (listed just 1 20 below) when performed on new pt. evals) X - Comprehensive Assessment (HX, ROS, Risk Assessments, Wounds Hx, etc.) 1 25 ASSESSMENTS - Wound and Skin Assessment / Reassessment X - Simple Wound Assessment / Reassessment - one wound 1 5 []  - Complex Wound Assessment / Reassessment - multiple wounds 0 []  - Dermatologic / Skin Assessment (not related to wound area) 0 ASSESSMENTS - Ostomy and/or Continence Assessment and Care []  - Incontinence Assessment and Management 0 []  - Ostomy Care Assessment and Management (repouching, etc.) 0 PROCESS - Coordination of Care []  - Simple Patient / Family Education for ongoing care 0 X - Complex (extensive) Patient / Family Education for ongoing care 1 20 []  - Staff obtains Programmer, systems, Records, Test Results / Process Orders 0 []  - Staff telephones HHA, Nursing Homes / Clarify orders / etc 0 []  - Routine Transfer to another Facility (non-emergent condition) 0 []  - Routine Hospital Admission (non-emergent condition) 0 []  - New Admissions / Biomedical engineer / Ordering NPWT, Apligraf, etc. 0 []  - Emergency Hospital Admission (emergent condition) 0 X - Simple Discharge Coordination 1 10 [name], [name]  ([id number]) []  - Complex (extensive) Discharge Coordination 0 PROCESS - Special Needs []  - Pediatric / Minor Patient Management 0 []  - Isolation Patient Management 0 []  - Hearing / Language / Visual special needs 0 []  - Assessment of Community assistance (transportation, D/C planning, etc.) 0 []  - Additional assistance / Altered mentation 0 []  - Support Surface(s) Assessment (bed, cushion, seat, etc.)  0 INTERVENTIONS - Wound Cleansing / Measurement X - Wound Imaging (photographs - any number of wounds) 1 5 []  - Wound Tracing (instead of photographs) 0 X - Simple Wound Measurement - one wound 1 5 []  - Complex Wound Measurement - multiple wounds 0 X - Simple Wound Cleansing - one wound 1 5 []  - Complex Wound Cleansing - multiple wounds 0 INTERVENTIONS - Wound Dressings X - Small Wound Dressing one or multiple wounds 1 10 []  - Medium Wound Dressing one or multiple wounds 0 []  - Large Wound Dressing one or multiple wounds 0 []  - Application of Medications - injection 0 INTERVENTIONS - Miscellaneous []  - External ear exam 0 []  - Specimen Collection (cultures, biopsies, blood, body fluids, etc.) 0 []  - Specimen(s) / Culture(s) sent or taken to Lab for analysis 0 []  - Patient Transfer (multiple staff / Civil Service fast streamer / Similar devices) 0 []  - Simple Staple / Suture removal (25 or less) 0 []  - Complex Staple / Suture removal (26 or more) 0 [name] []  - Hypo / Hyperglycemic Management (close monitor of Blood Glucose) 0 []  - Ankle / Brachial Index (ABI) - do not check if billed separately 0 Has the patient been seen at the hospital within the last three years: Yes Total Score: 105 Level Of Care: New/Established - Level 3 Electronic Signature(s) Signed: 06/02/2015 1:51:49 PM By: [name] Entered By: [name] on 06/02/2015 10:17:08 [name], [name] ([id number]) -------------------------------------------------------------------------------- Encounter Discharge  Information Details Patient Name: [name] Date of Service: 06/02/2015 9:30 AM Medical Record Number: [id number] Patient Account Number: [id number] Date of Birth/Sex: 1923-03-02 (81 y.o. Female) Treating RN: [name] Primary Care Physician: [name], [name] Other Clinician: Referring Physician: Caryl [name], [name] Treating Physician/Extender: [name] in Treatment: 0 Encounter Discharge Information Items Discharge Pain Level: 0 Discharge Condition: Stable Ambulatory Status: Walker Discharge Destination: Home Transportation: Private Auto Accompanied By: [name] Schedule Follow-up Appointment: Yes Medication Reconciliation completed and provided to Patient/Care Yes Daveigh Batty: Provided on Clinical Summary of Care: 06/02/2015 Form Type Recipient Paper Patient AB Electronic Signature(s) Signed: 06/02/2015 10:35:54 AM By: [name] Companion MD Previous Signature: 06/02/2015 10:33:48 AM Version By: Sharon Mt Entered By: [name] on 06/02/2015 10:35:54 Gonsalez, Sharnetta ([id number]) -------------------------------------------------------------------------------- Lower Extremity Assessment Details Patient Name: [name] Date of Service: 06/02/2015 9:30 AM Medical Record Number: [id number] Patient Account Number: [id number] Date of Birth/Sex: [birth year] (81 y.o. Female) Treating RN: [name] Primary Care Physician: [name], [name] Other Clinician: Referring Physician: Caryl [name], [name] Treating Physician/Extender: [name] Weeks in Treatment: 0 Electronic Signature(s) Signed: 06/02/2015 1:51:49 PM By: [name] Entered By: [name] on 06/02/2015 09:42:47 Salls, Maddisen ([id number]) -------------------------------------------------------------------------------- Multi Wound Chart Details Patient Name: [name] Date of Service: 06/02/2015 9:30 AM Medical Record Number: [id number] Patient Account Number: [id number] Date of Birth/Sex:  June 27, 1922 (81 y.o. Female) Treating RN: [name] Primary Care Physician: [name], [name] Other Clinician: Referring Physician: Caryl [name], [name] Treating Physician/Extender: [name] in Treatment: 0 Vital Signs Height(in): Pulse(bpm): 83 Weight(lbs): Blood Pressure 163/71 (mmHg): Body Mass Index(BMI): Temperature(F): 97.8 Respiratory Rate 16 (breaths/min): Photos: [2:No Photos] [N/A:N/A] Wound Location: [2:Left Gluteus] [N/A:N/A] Wounding Event: [2:Pressure Injury] [N/A:N/A] Primary Etiology: [2:Pressure Ulcer] [N/A:N/A] Date Acquired: [2:08/02/2014] [N/A:N/A] Weeks of Treatment: [2:0] [N/A:N/A] Wound Status: [2:Open] [N/A:N/A] Measurements L x W x D 0.6x0.6x0.1 [N/A:N/A] (cm) Area (cm) : [2:0.283] [N/A:N/A] Volume (cm) : [2:0.028] [N/A:N/A] % Reduction in Area: [2:0.00%] [N/A:N/A] % Reduction in Volume: 0.00% [N/A:N/A] Classification: [2:Category/Stage II] [N/A:N/A] Exudate Amount: [2:None Present] [N/A:N/A] Wound Margin: [  2:Flat and Intact] [N/A:N/A] Granulation Amount: [2:Large (67-100%)] [N/A:N/A] Granulation Quality: [2:Red, Pink] [N/A:N/A] Necrotic Amount: [2:None Present (0%)] [N/A:N/A] Exposed Structures: [2:Fascia: No Fat: No Tendon: No Muscle: No Joint: No Bone: No Limited to Skin Breakdown] [N/A:N/A] Epithelialization: [2:None] [N/A:N/A] Periwound Skin Texture: Edema: No [2:Excoriation: No] [N/A:N/A] Induration: No Callus: No Crepitus: No Fluctuance: No Friable: No Rash: No Scarring: No Periwound Skin Dry/Scaly: Yes N/A N/A Moisture: Maceration: No Moist: No Periwound Skin Color: Atrophie Blanche: No N/A N/A Cyanosis: No Ecchymosis: No Erythema: No Hemosiderin Staining: No Mottled: No Pallor: No Rubor: No Temperature: No Abnormality N/A N/A Tenderness on Yes N/A N/A Palpation: Wound Preparation: Ulcer Cleansing: N/A N/A Rinsed/Irrigated with Saline Topical Anesthetic Applied: Other: lidocaine 4% Treatment  Notes Electronic Signature(s) Signed: 06/02/2015 1:51:49 PM By: [name] Entered By: [name] on 06/02/2015 09:55:30 Fedorchak, Karalynn ([id number]) -------------------------------------------------------------------------------- Multi-Disciplinary Care Plan Details Patient Name: [name] Date of Service: 06/02/2015 9:30 AM Medical Record Number: [id number] Patient Account Number: [id number] Date of Birth/Sex: 12-02-1922 (81 y.o. Female) Treating RN: Carolyne Fiscal, Debi Primary Care Physician: [name], [name] Other Clinician: Referring Physician: Caryl [name], [name] Treating Physician/Extender: [name] in Treatment: 0 Active Inactive Abuse / Safety / Falls / Self Care Management Nursing Diagnoses: Impaired physical mobility Potential for falls Goals: Patient will remain injury free Date Initiated: 06/02/2015 Goal Status: Active Patient/caregiver will verbalize/demonstrate measures taken to improve the patient's personal safety Date Initiated: 06/02/2015 Goal Status: Active Interventions: Assess fall risk on admission and as needed Assess impairment of mobility on admission and as needed per policy Notes: Nutrition Nursing Diagnoses: Imbalanced nutrition Goals: Patient/caregiver agrees to and verbalizes understanding of need to use nutritional supplements and/or vitamins as prescribed Date Initiated: 06/02/2015 Goal Status: Active Interventions: Assess patient nutrition upon admission and as needed per policy Notes: Orientation to the [place] ([id number]) Nursing Diagnoses: Knowledge deficit related to the wound healing center program Goals: Patient/caregiver will verbalize understanding of the [facility] Date Initiated: 06/02/2015 Goal Status: Active Interventions: Provide education on orientation to the wound center Notes: Pain, Acute or Chronic Nursing Diagnoses: Pain Management - Cyclic Acute  (Dressing Change Related) Pain Management - Non-cyclic Acute (Procedural) Goals: Patient will verbalize adequate pain control and receive pain control interventions during procedures as needed Date Initiated: 06/02/2015 Goal Status: Active Patient/caregiver will verbalize adequate pain control between visits Date Initiated: 06/02/2015 Goal Status: Active Interventions: Assess comfort goal upon admission Complete pain assessment as per visit requirements Notes: Pressure Nursing Diagnoses: Knowledge deficit related to causes and risk factors for pressure ulcer development Knowledge deficit related to management of pressures ulcers Potential for impaired tissue integrity related to pressure, friction, moisture, and shear Goals: Patient will remain free from development of additional pressure ulcers Date Initiated: 06/02/2015 Goal Status: Active Patient/caregiver will verbalize risk factors for pressure ulcer development Gambrell, Conya ([id number]) Date Initiated: 06/02/2015 Goal Status: Active Interventions: Assess: immobility, friction, shearing, incontinence upon admission and as needed Assess potential for pressure ulcer upon admission and as needed Notes: Wound/Skin Impairment Nursing Diagnoses: Impaired tissue integrity Goals: Ulcer/skin breakdown will have a volume reduction of 30% by week 4 Date Initiated: 06/02/2015 Goal Status: Active Ulcer/skin breakdown will have a volume reduction of 50% by week 8 Date Initiated: 06/02/2015 Goal Status: Active Ulcer/skin breakdown will have a volume reduction of 80% by week 12 Date Initiated: 06/02/2015 Goal Status: Active Interventions: Assess patient/caregiver ability to obtain necessary supplies Assess patient/caregiver ability to perform ulcer/skin care regimen upon admission and as  needed Notes: Electronic Signature(s) Signed: 06/02/2015 1:51:49 PM By: [name] Entered By: [name] on 06/02/2015  10:00:06 Petillo, Cledith ([id number]) -------------------------------------------------------------------------------- Pain Assessment Details Patient Name: [name] Date of Service: 06/02/2015 9:30 AM Medical Record Number: [id number] Patient Account Number: [id number] Date of Birth/Sex: [birth year] (81 y.o. Female) Treating RN: [name] Primary Care Physician: [name], [name] Other Clinician: Referring Physician: Caryl [name], [name] Treating Physician/Extender: [name] in Treatment: 0 Active Problems Location of Pain Severity and Description of Pain Patient Has Paino Yes Site Locations Pain Location: Pain in Ulcers Duration of the Pain. Constant / Intermittento Constant Rate the pain. Current Pain Level: 5 Character of Pain Describe the Pain: Burning Pain Management and Medication Current Pain Management: Electronic Signature(s) Signed: 06/02/2015 1:51:49 PM By: [name] Entered By: [name] on 06/02/2015 09:42:30 Dandy, Nawaal ([id number]) -------------------------------------------------------------------------------- Patient/Caregiver Education Details Patient Name: [name] Date of Service: 06/02/2015 9:30 AM Medical Record Number: [id number] Patient Account Number: [id number] Date of Birth/Gender: 07-23-1922 (81 y.o. Female) Treating RN: [name] Primary Care Physician: [name], [name] Other Clinician: Referring Physician: Caryl [name], [name] Treating Physician/Extender: [name] in Treatment: 0 Education Assessment Education Provided To: Patient and Caregiver Education Topics Provided Wound/Skin Impairment: Handouts: Other: change dressing as directed Methods: Demonstration, Explain/Verbal Responses: State content correctly Electronic Signature(s) Signed: 06/02/2015 10:36:03 AM By: [name] Companion MD Entered By: [name] on 06/02/2015 10:36:03 Dauphinee, Metta  ([id number]) -------------------------------------------------------------------------------- Wound Assessment Details Patient Name: [name] Date of Service: 06/02/2015 9:30 AM Medical Record Number: [id number] Patient Account Number: [id number] Date of Birth/Sex: 08-10-1922 (81 y.o. Female) Treating RN: Carolyne Fiscal, Debi Primary Care Physician: [name], [name] Other Clinician: Referring Physician: Caryl [name], [name] Treating Physician/Extender: [name] Weeks in Treatment: 0 Wound Status Wound Number: 2 Primary Pressure Ulcer Etiology: Wound Location: Left Gluteus Wound Status: Open Wounding Event: Pressure Injury Comorbid Cataracts, Hypertension, Date Acquired: 08/02/2014 History: Osteoarthritis Weeks Of Treatment: 0 Clustered Wound: No Photos Photo Uploaded By: Regan Lemming on 06/02/2015 12:58:42 Wound Measurements Length: (cm) 0.6 Width: (cm) 0.6 Depth: (cm) 0.1 Area: (cm) 0.283 Volume: (cm) 0.028 % Reduction in Area: 0% % Reduction in Volume: 0% Epithelialization: None Tunneling: No Undermining: No Wound Description Classification: Category/Stage II Wound Margin: Flat and Intact Exudate Amount: Medium Exudate Type: Serous Exudate Color: amber Foul Odor After Cleansing: No Wound Bed Granulation Amount: Large (67-100%) Exposed Structure Granulation Quality: Red, Pink Fascia Exposed: No Necrotic Amount: None Present (0%) Fat Layer Exposed: No Tendon Exposed: No Deming, Quiera ([id number]) Muscle Exposed: No Joint Exposed: No Bone Exposed: No Limited to Skin Breakdown Periwound Skin Texture Texture Color No Abnormalities Noted: No No Abnormalities Noted: No Callus: No Atrophie Blanche: No Crepitus: No Cyanosis: No Excoriation: No Ecchymosis: No Fluctuance: No Erythema: No Friable: No Hemosiderin Staining: No Induration: No Mottled: No Localized Edema: No Pallor: No Rash: No Rubor: No Scarring: No Temperature / Pain Moisture Temperature:  No Abnormality No Abnormalities Noted: No Tenderness on Palpation: Yes Dry / Scaly: Yes Maceration: No Moist: No Wound Preparation Ulcer Cleansing: Rinsed/Irrigated with Saline Topical Anesthetic Applied: Other: lidocaine 4%, Treatment Notes Wound #2 (Left Gluteus) 1. Cleansed with: Clean wound with Normal Saline 2. Anesthetic Topical Lidocaine 4% cream to wound bed prior to debridement 3. Peri-wound Care: Skin Prep 4. Dressing Applied: Prisma Ag 5. Secondary Dressing Applied Bordered Foam Dressing Electronic Signature(s) Signed: 06/02/2015 1:51:49 PM By: [name] Previous Signature: 06/02/2015 9:48:14 AM Version By: Regan Lemming BSN, RN Entered By: [name] on 06/02/2015 10:28:52 Jaeger, Karianne ([id number]) -------------------------------------------------------------------------------- Vitals  Details Patient Name: On[place], [facility] Date of Service: 06/02/2015 9:30 AM Medical Record Number: [id number] Patient Account Number: [id number] Date of Birth/Sex: March 18, 1923 (81 y.o. Female) Treating RN: [name] Primary Care Physician: [name], [name] Other Clinician: Referring Physician: Caryl [name], [name] Treating Physician/Extender: [name] Weeks in Treatment: 0 Vital Signs Time Taken: 09:42 Temperature (F): 97.8 Pulse (bpm): 83 Respiratory Rate (breaths/min): 16 Blood Pressure (mmHg): 163/71 Reference Range: 80 - 120 mg / dl Electronic Signature(s) Signed: 06/02/2015 1:51:49 PM By: [name] Entered By: [name] on 06/02/2015 09:46:03

## 2015-06-03 NOTE — Progress Notes (Signed)
[name], [name] ([id number]) Visit Report for 06/02/2015 Abuse/Suicide Risk Screen Details Patient Name: [name], [name] 06/02/2015 9:30 Date of Service: AM Medical Record [id number] Number: Patient Account Number: [id number] March 22, 1923 (81 y.o. Treating RN: [name] Date of Birth/Sex: Female) Other Clinician: Primary Care Physician: [name] Treating [name] Referring Physician: Caryl Bis, [name] Physician/Extender: Weeks in Treatment: 0 Abuse/Suicide Risk Screen Items Answer ABUSE/SUICIDE RISK SCREEN: Has anyone close to you tried to hurt or harm you recentlyo No Do you feel uncomfortable with anyone in your familyo No Has anyone forced you do things that you didnot want to doo No Do you have any thoughts of harming yourselfo No Patient displays signs or symptoms of abuse and/or neglect. No Electronic Signature(s) Signed: 06/02/2015 1:51:49 PM By: [name] Entered By: [name] on 06/02/2015 09:51:03 [name], [name] -------------------------------------------------------------------------------- Activities of Daily Living Details Patient Name: [name], [name] 06/02/2015 9:30 Date of Service: AM Medical Record [id number] Number: Patient Account Number: [id number] [birth year] (81 y.o. Treating RN: [name] Date of Birth/Sex: Female) Other Clinician: Primary Care Physician: [name] Bis, [place], [place] Referring Physician: Caryl Bis, [name] Physician/Extender: [name] in Treatment: 0 Activities of Daily Living Items Answer Activities of Daily Living (Please select one for each item) Drive Automobile Not Able Take Medications Need Assistance Use Telephone Need Assistance Care for Appearance Need Assistance Use Toilet Need Assistance Bath / Shower Need Assistance Dress Self Need Assistance Feed Self Completely Able Walk Need Assistance Get In / Out Bed Need Assistance Housework Need Assistance Prepare Meals Need  Assistance Handle Money Need Assistance Shop for Self Not Able Electronic Signature(s) Signed: 06/02/2015 1:51:49 PM By: [name] Entered By: [name] on 06/02/2015 09:52:29 [name], [name] -------------------------------------------------------------------------------- Education Assessment Details Patient Name: [name], [name] 06/02/2015 9:30 Date of Service: AM Medical Record [id number] Number: Patient Account Number: [id number] Feb 01, 1923 (81 y.o. Treating RN: [name] Date of Birth/Sex: Female) Other Clinician: Primary Care Physician: [name] Treating [name], [name] Referring Physician: Caryl Bis, [name] Physician/Extender: [name] in Treatment: 0 Primary Learner Assessed: Patient Learning Preferences/Education Level/Primary Language Learning Preference: Explanation, Printed Material Highest Education Level: High School Preferred Language: English Cognitive Barrier Assessment/Beliefs Language Barrier: No Translator Needed: No Memory Deficit: No Emotional Barrier: No Cultural/Religious Beliefs Affecting Medical No Care: Physical Barrier Assessment Impaired Vision: Yes Glasses Impaired Hearing: Yes Hearing Aid Decreased Hand dexterity: No Knowledge/Comprehension Assessment Knowledge Level: High Comprehension Level: High Ability to understand written High instructions: Ability to understand verbal High instructions: Motivation Assessment Anxiety Level: Anxious Cooperation: Cooperative Education Importance: Acknowledges Need Interest in Health Problems: Asks Questions Perception: Coherent Willingness to Engage in Self- High Management Activities: Readiness to Engage in Self- High Management Activities: [name], [name] Electronic Signature(s) Signed: 06/02/2015 1:51:49 PM By: [name] Entered By: [name] on 06/02/2015 09:53:06 [name], [name] -------------------------------------------------------------------------------- Fall Risk Assessment Details Patient Name: [name], [name] 06/02/2015 9:30 Date of Service: AM Medical Record [id number] Number: Patient Account Number: [id number] Oct 10, 1922 (81 y.o. Treating RN: [name] Date of Birth/Sex: Female) Other Clinician: Primary Care Physician: [name] Treating [name], [name] Referring Physician: Caryl Bis, [name] Physician/Extender: [name] in Treatment: 0 Fall Risk Assessment Items Have you had 2 or more falls in the last 12 monthso 0 Yes Have you had any fall that resulted in injury in the last 12 monthso 0 Yes FALL RISK ASSESSMENT: History of falling - immediate or within 3 months 25 Yes Secondary diagnosis 15 Yes Ambulatory aid None/bed rest/wheelchair/nurse 0 No Crutches/cane/walker 15 Yes Furniture 0 No IV Access/Saline Lock 0  No Gait/Training Normal/bed rest/immobile 0 No Weak 10 Yes Impaired 20 Yes Mental Status Oriented to own ability 0 Yes Electronic Signature(s) Signed: 06/02/2015 1:51:49 PM By: [name] Entered By: [name] on 06/02/2015 09:53:43 [name], [name] -------------------------------------------------------------------------------- Foot Assessment Details Patient Name: [name] 06/02/2015 9:30 Date of Service: AM Medical Record [id number] Number: Patient Account Number: [id number] 02-Jun-1923 (81 y.o. Treating RN: [name] Date of Birth/Sex: Female) Other Clinician: Primary Care Physician: [name] Treating [name] Referring Physician: Caryl Bis, [name] Physician/Extender: Weeks in Treatment: 0 Foot Assessment Items Site Locations + = Sensation present, - = Sensation absent, C = Callus, U = Ulcer R = Redness, W = Warmth, M = Maceration, PU = Pre-ulcerative lesion F = Fissure, S = Swelling, D = Dryness Assessment Right: Left: Other Deformity: No No Prior Foot  Ulcer: No No Prior Amputation: No No Charcot Joint: No No Ambulatory Status: Ambulatory With Help Assistance Device: Walker Gait: Steady Electronic Signature(s) Signed: 06/02/2015 1:51:49 PM By: [name] Entered By: [name] on 06/02/2015 09:55:04 [name], [name] ([id number]) [name], [place] ([id number]) -------------------------------------------------------------------------------- Nutrition Risk Assessment Details Patient Name: PEZZINO, [place] 06/02/2015 9:30 Date of Service: AM Medical Record [id number] Number: Patient Account Number: [id number] 09-Feb-1923 (81 y.o. Treating RN: [name] Date of Birth/Sex: Female) Other Clinician: Primary Care Physician: [name] Treating [name] [name] Referring Physician: Caryl Bis, [name] Physician/Extender: Weeks in Treatment: 0 Height (in): Weight (lbs): Body Mass Index (BMI): Nutrition Risk Assessment Items NUTRITION RISK SCREEN: I have an illness or condition that made me change the kind and/or 0 No amount of food I eat I eat fewer than two meals per day 0 No I eat few fruits and vegetables, or milk products 0 No I have three or more drinks of beer, liquor or wine almost every day 0 No I have tooth or mouth problems that make it hard for me to eat 0 No I don't always have enough money to buy the food I need 0 No I eat alone most of the time 0 No I take three or more different prescribed or over-the-counter drugs a 1 Yes day Without wanting to, I have lost or gained 10 pounds in the last six 0 No months I am not always physically able to shop, cook and/or feed myself 2 Yes Nutrition Protocols Good Risk Protocol Moderate Risk Protocol Electronic Signature(s) Signed: 06/02/2015 1:51:49 PM By: [name] Entered By: [name] on 06/02/2015 09:54:16

## 2015-06-06 DIAGNOSIS — M545 Low back pain: Secondary | ICD-10-CM | POA: Diagnosis not present

## 2015-06-06 DIAGNOSIS — L89321 Pressure ulcer of left buttock, stage 1: Secondary | ICD-10-CM | POA: Diagnosis not present

## 2015-06-06 DIAGNOSIS — W06XXXD Fall from bed, subsequent encounter: Secondary | ICD-10-CM | POA: Diagnosis not present

## 2015-06-06 DIAGNOSIS — S8991XD Unspecified injury of right lower leg, subsequent encounter: Secondary | ICD-10-CM | POA: Diagnosis not present

## 2015-06-06 DIAGNOSIS — N390 Urinary tract infection, site not specified: Secondary | ICD-10-CM | POA: Diagnosis not present

## 2015-06-06 DIAGNOSIS — A499 Bacterial infection, unspecified: Secondary | ICD-10-CM | POA: Diagnosis not present

## 2015-06-08 ENCOUNTER — Ambulatory Visit: Payer: Medicare Other | Admitting: Podiatry

## 2015-06-08 DIAGNOSIS — S8991XD Unspecified injury of right lower leg, subsequent encounter: Secondary | ICD-10-CM | POA: Diagnosis not present

## 2015-06-08 DIAGNOSIS — M545 Low back pain: Secondary | ICD-10-CM | POA: Diagnosis not present

## 2015-06-08 DIAGNOSIS — A499 Bacterial infection, unspecified: Secondary | ICD-10-CM | POA: Diagnosis not present

## 2015-06-08 DIAGNOSIS — L89321 Pressure ulcer of left buttock, stage 1: Secondary | ICD-10-CM | POA: Diagnosis not present

## 2015-06-08 DIAGNOSIS — W06XXXD Fall from bed, subsequent encounter: Secondary | ICD-10-CM | POA: Diagnosis not present

## 2015-06-08 DIAGNOSIS — N390 Urinary tract infection, site not specified: Secondary | ICD-10-CM | POA: Diagnosis not present

## 2015-06-12 ENCOUNTER — Ambulatory Visit: Payer: Medicare Other | Admitting: Surgery

## 2015-06-14 ENCOUNTER — Encounter: Payer: Self-pay | Admitting: Family Medicine

## 2015-06-14 ENCOUNTER — Ambulatory Visit (INDEPENDENT_AMBULATORY_CARE_PROVIDER_SITE_OTHER): Payer: Medicare Other | Admitting: Family Medicine

## 2015-06-14 VITALS — BP 126/78 | HR 68 | Temp 98.6°F | Ht 61.0 in | Wt 116.0 lb

## 2015-06-14 DIAGNOSIS — J069 Acute upper respiratory infection, unspecified: Secondary | ICD-10-CM

## 2015-06-14 HISTORY — DX: Acute upper respiratory infection, unspecified: J06.9

## 2015-06-14 NOTE — Progress Notes (Signed)
Pre visit review using our clinic review tool, if applicable. No additional management support is needed unless otherwise documented below in the visit note. 

## 2015-06-14 NOTE — Patient Instructions (Signed)
Nice to see you. Your symptoms are likely related to a viral upper respiratory infection. You can continue coricidin. You could try claritin or flonase as well.  Please stay well hydrated.  If you develop chest pain, shortness of breath, cough productive of blood, fevers, feel poorly, or any new or change in symptoms please seek medical attention.

## 2015-06-14 NOTE — Progress Notes (Signed)
Patient ID: [name], female   DOB: [birth year], 82 y.o.   MRN: [id number]  Tommi Rumps, MD Phone: [phone]  [name] is a 82 y.o. female who presents today for same-day visit.  Patient notes starting on Sunday she developed rhinorrhea, postnasal drip, nasal congestion, and cough. Cough is nonproductive. She does note she is blowing clear phlegm from her nose. No fevers or shortness of breath. No chest pain. She's been taking Coricidin as needed with some benefit. She feels as though she is getting better at this time.  PMH: nonsmoker.   ROS see history of present illness  Objective  Physical Exam Filed Vitals:   06/14/15 1415  BP: 126/78  Pulse: 68  Temp: 98.6 F (37 C)    BP Readings from Last 3 Encounters:  06/14/15 126/78  05/25/15 145/85  05/22/15 124/78   Wt Readings from Last 3 Encounters:  06/14/15 116 lb (52.617 kg)  05/25/15 113 lb 14.4 oz (51.665 kg)  05/22/15 116 lb 12.8 oz (52.98 kg)    Physical Exam  Constitutional: She is well-developed, well-nourished, and in no distress.  HENT:  Head: Normocephalic and atraumatic.  Right Ear: External ear normal.  Left Ear: External ear normal.  Mouth/Throat: Oropharynx is clear and moist. No oropharyngeal exudate.  Normal TMs bilaterally  Eyes: Conjunctivae are normal. Pupils are equal, round, and reactive to light.  Neck: Neck supple.  Cardiovascular: Normal rate, regular rhythm and normal heart sounds.  Exam reveals no gallop and no friction rub.   No murmur heard. Pulmonary/Chest: Effort normal and breath sounds normal. No respiratory distress. She has no wheezes. She has no rales.  Lymphadenopathy:    She has no cervical adenopathy.  Neurological: She is alert.  Skin: Skin is warm and dry. She is not diaphoretic.     Assessment/Plan: Please see individual problem list.  Viral upper respiratory infection Symptoms are most consistent with viral upper respiratory infection. She's afebrile and  vital signs are stable. Lung exam is normal. Discussed supportive care including continuing Coricidin, starting Claritin or Flonase, and staying well-hydrated. She is given return precautions.    [employer]agon [employer] training and education officer was used during the dictation process of this note. If any phrases or words seem inappropriate it is likely secondary to the translation process being inefficient.  Tommi Rumps

## 2015-06-14 NOTE — Assessment & Plan Note (Signed)
Symptoms are most consistent with viral upper respiratory infection. She's afebrile and vital signs are stable. Lung exam is normal. Discussed supportive care including continuing Coricidin, starting Claritin or Flonase, and staying well-hydrated. She is given return precautions.

## 2015-06-20 DIAGNOSIS — L89321 Pressure ulcer of left buttock, stage 1: Secondary | ICD-10-CM | POA: Diagnosis not present

## 2015-06-20 DIAGNOSIS — N390 Urinary tract infection, site not specified: Secondary | ICD-10-CM | POA: Diagnosis not present

## 2015-06-20 DIAGNOSIS — A499 Bacterial infection, unspecified: Secondary | ICD-10-CM | POA: Diagnosis not present

## 2015-06-20 DIAGNOSIS — M545 Low back pain: Secondary | ICD-10-CM | POA: Diagnosis not present

## 2015-06-20 DIAGNOSIS — W06XXXD Fall from bed, subsequent encounter: Secondary | ICD-10-CM | POA: Diagnosis not present

## 2015-06-20 DIAGNOSIS — S8991XD Unspecified injury of right lower leg, subsequent encounter: Secondary | ICD-10-CM | POA: Diagnosis not present

## 2015-06-22 ENCOUNTER — Ambulatory Visit: Payer: Medicare Other

## 2015-06-22 ENCOUNTER — Encounter: Payer: Self-pay | Admitting: Urology

## 2015-06-22 VITALS — BP 173/81 | HR 84 | Ht 61.0 in | Wt 115.6 lb

## 2015-06-22 DIAGNOSIS — R339 Retention of urine, unspecified: Secondary | ICD-10-CM

## 2015-06-22 NOTE — Progress Notes (Unsigned)
06/22/2015 1:47 PM   [name] 1923-01-11 [id number]  Referring provider: Leone Haven, MD [address] [place], [place] [address]  Chief Complaint  Patient presents with  . Urinary Incontinence    HPI: The patient is a 82 year old female who presents after 550 cc urinary retention. She is also recurrent UTIs over the last 6 weeks. She has been told that the UTIs were from her inability to empty the bladder. She's not have issues with UTIs prior to this time however she had frequency, hesitancy, nocturia 4, incontinence, intermittency, and weak stream. She denies hematuria and kidney stones.   November 2016 Interval history: The patient returns for catheter change. Over the last days she developed a fever the level over 101. She was seen in the ER but not started on antibiotics. Her family states that fever is usually her first and only symptom of urinary tract infection. She also has been weaker and lethargic more so than baseline the last 2 days.   December 2016 interval history: The patient was treated with appropriate antibiotics for her urinary tract infection which was Escherichia coli. She's had no urinary tract infections with symptoms since that time. She's had no changes in mental status, fevers, or unexplained feeling of weakness    January 2017 Interval history:   PMH: Past Medical History  Diagnosis Date  . HTN (hypertension)   . Syncope   . Hyperlipidemia   . Ischemic colitis ([place])   . Thrombocytopenia ([place])   . Mobitz type 2 second degree heart block   . Anemia   . Pacemaker   . Arthritis   . Arrhythmia   . Atrial fibrillation ([place])   . Stroke [facility])     Surgical History: Past Surgical History  Procedure Laterality Date  . Left hip replacement     . Cholecystectomy    . Pacemaker placement      Home Medications:    Medication List       This list is accurate as of: 06/22/15  1:47 PM.  Always use your most recent med list.               aspirin EC 81 MG tablet  Take 81 mg by mouth daily.     carvedilol 12.5 MG tablet  Commonly known as:  COREG  Take 12.5 mg by mouth 2 (two) times daily.     Fish Oil 1000 MG Caps  Take 2,000 mg by mouth daily.     furosemide 20 MG tablet  Commonly known as:  LASIX  Take 10 mg by mouth daily as needed for edema.     hydrALAZINE 10 MG tablet  Commonly known as:  APRESOLINE  Take 10 mg by mouth 2 (two) times daily.     HYDROcodone-acetaminophen 5-325 MG tablet  Commonly known as:  NORCO/VICODIN  Take 1 tablet by mouth 4 (four) times daily as needed for moderate pain.     latanoprost 0.005 % ophthalmic solution  Commonly known as:  XALATAN  Place 1 drop into both eyes at bedtime.     losartan 100 MG tablet  Commonly known as:  COZAAR  Take 50 mg by mouth 2 (two) times daily.     multivitamin with minerals Tabs tablet  Take 1 tablet by mouth daily.     PRESERVISION AREDS 2 Caps  Take 2 capsules by mouth daily.     ZINC OXIDE (TOPICAL) 10 % Crea  Apply 1 application topically 2 (two)  times daily. To affected area        Allergies:  Allergies  Allergen Reactions  . Atenolol Other (See Comments)    Reaction:  Complete Heart Block  . Penicillins Other (See Comments)    rash  . Dilaudid [Hydromorphone Hcl] Rash  . Hydromorphone Rash    Family History: Family History  Problem Relation Age of Onset  . Stroke Mother   . Alcoholism Father   . Hypertension Sister   . Prostate cancer Neg Hx   . Bladder Cancer Neg Hx   . Kidney cancer Neg Hx     Social History:  reports that she has never smoked. She has never used smokeless tobacco. She reports that she drinks about 0.6 oz of alcohol per week. She reports that she does not use illicit drugs.  ROS:                                        Physical Exam: There were no vitals taken for this visit.  Constitutional:  Alert and oriented, No acute distress. HEENT: [place] AT, moist  mucus membranes.  Trachea midline, no masses. Cardiovascular: No clubbing, cyanosis, or edema. Respiratory: Normal respiratory effort, no increased work of breathing. GI: Abdomen is soft, nontender, nondistended, no abdominal masses GU: No CVA tenderness. *** Skin: No rashes, bruises or suspicious lesions. Lymph: No cervical or inguinal adenopathy. Neurologic: Grossly intact, no focal deficits, moving all 4 extremities. Psychiatric: Normal mood and affect.  Laboratory Data: Lab Results  Component Value Date   WBC 6.0 05/31/2015   HGB 10.2* 05/31/2015   HCT 31.2* 05/31/2015   MCV 86.0 05/31/2015   PLT 101* 05/31/2015    Lab Results  Component Value Date   CREATININE 0.91 05/01/2015    No results found for: PSA  No results found for: TESTOSTERONE  No results found for: HGBA1C  Urinalysis     There are no diagnoses linked to this encounter.  No Follow-up on file.  Nickie Retort, MD  [facility] [address] [place], [place] [address] [phone]

## 2015-06-22 NOTE — Progress Notes (Signed)
Cath Change/ Replacement  Patient is present today for a catheter change due to urinary retention. 8 ml of water was removed from the balloon, a 16 FR foley cath was removed with out difficulty.  Patient was cleaned and prepped in a sterile fashion with betadine and 2% lidocaine jelly was instilled into the urethra. A 16 FR foley cath was replaced into the bladder no complications were noted Urine return was noted and urine was yellow  in color. The balloon was filled with 10ml of sterile water. A night bag was attached for drainage, because pt declined leg bag.  A extra night bag was also given to the patient and patient has been  given instruction on how to change from one bag to another. Patient was given proper instruction on catheter care.    Preformed by: K.Lyrika Souders,CMA  Follow up: 1 mth This encounter was created in error - please disregard.

## 2015-06-23 ENCOUNTER — Ambulatory Visit (INDEPENDENT_AMBULATORY_CARE_PROVIDER_SITE_OTHER): Payer: Medicare Other | Admitting: Family Medicine

## 2015-06-23 ENCOUNTER — Encounter: Payer: Self-pay | Admitting: Family Medicine

## 2015-06-23 VITALS — BP 130/76 | HR 85 | Temp 98.4°F | Ht 61.0 in | Wt 115.0 lb

## 2015-06-23 DIAGNOSIS — J069 Acute upper respiratory infection, unspecified: Secondary | ICD-10-CM

## 2015-06-23 MED ORDER — PREDNISONE 10 MG PO TABS
ORAL_TABLET | ORAL | Status: DC
Start: 2015-06-23 — End: 2016-03-19

## 2015-06-23 NOTE — Progress Notes (Signed)
Pre visit review using our clinic review tool, if applicable. No additional management support is needed unless otherwise documented below in the visit note. 

## 2015-06-23 NOTE — Patient Instructions (Signed)
Nice to see you. You likely have a bronchitis. We will treat this with prednisone. If you develop chest pain, shortness of breath, cough productive of blood, fevers, or any new or change in symptoms please seek medical attention.

## 2015-06-23 NOTE — Progress Notes (Signed)
Patient ID: Rikiya Dupriest, female   DOB: [birth year], 82 y.o.   MRN: [id number]  Tommi Rumps, MD Phone: [phone]  Taylah Pasco is a 82 y.o. female who presents today for same-day visit.  Patient notes persistent cough since her last visit. She was seen for a viral upper respiratory illness. She notes her nose feels full. She denies sinus pressure and congestion. Some postnasal drip. No fevers. No chest pain. No shortness of breath. Cough is productive of minimal white sputum. She notes she feels well and does not feel ill. The cough is most bothersome to her. She takes Coricidin and this helps with the cough.  PMH: nonsmoker.   ROS see history of present illness  Objective  Physical Exam Filed Vitals:   06/23/15 1300  BP: 130/76  Pulse: 85  Temp: 98.4 F (36.9 C)   Physical Exam  Constitutional: She is well-developed, well-nourished, and in no distress.  HENT:  Head: Normocephalic and atraumatic.  Right Ear: External ear normal.  Left Ear: External ear normal.  Mouth/Throat: Oropharynx is clear and moist. No oropharyngeal exudate.  Normal TMs bilaterally  Eyes: Conjunctivae are normal. Pupils are equal, round, and reactive to light.  Neck: Neck supple.  Cardiovascular: Normal rate, regular rhythm and normal heart sounds.  Exam reveals no gallop and no friction rub.   No murmur heard. Pulmonary/Chest: Effort normal and breath sounds normal. No respiratory distress. She has no wheezes. She has no rales.  Lymphadenopathy:    She has no cervical adenopathy.  Neurological: She is alert. Gait normal.  Skin: Skin is warm and dry. She is not diaphoretic.     Assessment/Plan: Please see individual problem list.  Viral upper respiratory infection Symptoms most consistent with bronchitis. She has no chest pain or shortness of breath with this. No fevers. She feels well overall. Suspect viral nature to this cough versus postviral cough. Vital signs are stable. Benign lung exam.  We will treat with a prednisone taper. Discussed adverse effects of prednisone. No focal findings to indicate need for antibiotic. Can continue Coricidin. She'll continue to monitor and let us know how she is feeling early next week. She is given return precautions.    Meds ordered this encounter  Medications  . predniSONE (DELTASONE) 10 MG tablet    Sig: Please take 50 mg (5 tablets) by mouth today, then decrease by one tablet by mouth daily until gone    Dispense:  15 tablet    Refill:  0    Tommi Rumps

## 2015-06-23 NOTE — Assessment & Plan Note (Addendum)
Symptoms most consistent with bronchitis. She has no chest pain or shortness of breath with this. No fevers. She feels well overall. Suspect viral nature to this cough versus postviral cough. Vital signs are stable. Benign lung exam. We will treat with a prednisone taper. Discussed adverse effects of prednisone. No focal findings to indicate need for antibiotic. Can continue Coricidin. She'll continue to monitor and let us know how she is feeling early next week. She is given return precautions.

## 2015-07-03 ENCOUNTER — Other Ambulatory Visit: Payer: Self-pay

## 2015-07-03 ENCOUNTER — Inpatient Hospital Stay: Payer: Medicare Other

## 2015-07-03 ENCOUNTER — Other Ambulatory Visit: Payer: Self-pay | Admitting: Hematology and Oncology

## 2015-07-03 ENCOUNTER — Inpatient Hospital Stay: Payer: Medicare Other | Attending: Hematology and Oncology

## 2015-07-03 DIAGNOSIS — D696 Thrombocytopenia, unspecified: Secondary | ICD-10-CM | POA: Diagnosis not present

## 2015-07-03 DIAGNOSIS — D72821 Monocytosis (symptomatic): Secondary | ICD-10-CM | POA: Diagnosis not present

## 2015-07-03 DIAGNOSIS — E538 Deficiency of other specified B group vitamins: Secondary | ICD-10-CM | POA: Diagnosis not present

## 2015-07-03 DIAGNOSIS — Z79899 Other long term (current) drug therapy: Secondary | ICD-10-CM | POA: Diagnosis not present

## 2015-07-03 LAB — CBC WITH DIFFERENTIAL/PLATELET
Basophils Absolute: 0.1 10*3/uL (ref 0–0.1)
Basophils Relative: 1 %
Eosinophils Absolute: 0 10*3/uL (ref 0–0.7)
Eosinophils Relative: 1 %
HCT: 31.8 % — ABNORMAL LOW (ref 35.0–47.0)
Hemoglobin: 10.6 g/dL — ABNORMAL LOW (ref 12.0–16.0)
Lymphocytes Relative: 26 %
Lymphs Abs: 1.7 10*3/uL (ref 1.0–3.6)
MCH: 28.7 pg (ref 26.0–34.0)
MCHC: 33.2 g/dL (ref 32.0–36.0)
MCV: 86.3 fL (ref 80.0–100.0)
Monocytes Absolute: 1.5 10*3/uL — ABNORMAL HIGH (ref 0.2–0.9)
Monocytes Relative: 24 %
Neutro Abs: 3.2 10*3/uL (ref 1.4–6.5)
Neutrophils Relative %: 48 %
Platelets: 62 10*3/uL — ABNORMAL LOW (ref 150–440)
RBC: 3.68 MIL/uL — ABNORMAL LOW (ref 3.80–5.20)
RDW: 16.5 % — ABNORMAL HIGH (ref 11.5–14.5)
WBC: 6.6 10*3/uL (ref 3.6–11.0)

## 2015-07-03 LAB — COMPREHENSIVE METABOLIC PANEL
ALT: 11 U/L — ABNORMAL LOW (ref 14–54)
AST: 14 U/L — ABNORMAL LOW (ref 15–41)
Albumin: 3.7 g/dL (ref 3.5–5.0)
Alkaline Phosphatase: 77 U/L (ref 38–126)
Anion gap: 5 (ref 5–15)
BUN: 14 mg/dL (ref 6–20)
CO2: 24 mmol/L (ref 22–32)
Calcium: 8.8 mg/dL — ABNORMAL LOW (ref 8.9–10.3)
Chloride: 98 mmol/L — ABNORMAL LOW (ref 101–111)
Creatinine, Ser: 0.89 mg/dL (ref 0.44–1.00)
GFR calc Af Amer: >60 mL/min (ref >60)
GFR calc non Af Amer: 55 mL/min — ABNORMAL LOW (ref >60)
Glucose, Bld: 135 mg/dL — ABNORMAL HIGH (ref 65–99)
Potassium: 3.7 mmol/L (ref 3.5–5.1)
Sodium: 127 mmol/L — ABNORMAL LOW (ref 135–145)
Total Bilirubin: 0.6 mg/dL (ref 0.3–1.2)
Total Protein: 7.4 g/dL (ref 6.5–8.1)

## 2015-07-03 MED ORDER — CYANOCOBALAMIN 1000 MCG/ML IJ SOLN
1000.0000 ug | Freq: Once | INTRAMUSCULAR | Status: AC
  Administered 2015-07-03: 1000 ug via INTRAMUSCULAR
  Filled 2015-07-03: qty 1

## 2015-07-06 ENCOUNTER — Ambulatory Visit: Payer: Medicare Other

## 2015-07-11 ENCOUNTER — Encounter: Payer: Self-pay | Admitting: Hematology and Oncology

## 2015-07-11 DIAGNOSIS — I442 Atrioventricular block, complete: Secondary | ICD-10-CM | POA: Diagnosis not present

## 2015-07-25 ENCOUNTER — Ambulatory Visit (INDEPENDENT_AMBULATORY_CARE_PROVIDER_SITE_OTHER): Payer: Medicare Other

## 2015-07-25 DIAGNOSIS — R339 Retention of urine, unspecified: Secondary | ICD-10-CM | POA: Diagnosis not present

## 2015-07-25 NOTE — Progress Notes (Signed)
Cath Change/ Replacement  Patient is present today for a catheter change due to urinary retention.  47ml of water was removed from the balloon, a 16FR foley cath was removed with out difficulty.  Patient was cleaned and prepped in a sterile fashion with betadine and 2% lidocaine jelly was instilled into the urethra. A 16 FR foley cath was replaced into the bladder no complications were noted Urine return was noted 47ml and urine was yellow in color. The balloon was filled with 85ml of sterile water. A over night bag was attached for drainage.  A night bag was also given to the patient and patient was given instruction on how to change from one bag to another. Patient was given proper instruction on catheter care.    Preformed by: Toniann Fail, LPN   Follow up: 1 mo

## 2015-07-27 ENCOUNTER — Other Ambulatory Visit: Payer: Self-pay

## 2015-08-01 ENCOUNTER — Ambulatory Visit: Payer: Medicare Other | Admitting: Hematology and Oncology

## 2015-08-01 ENCOUNTER — Other Ambulatory Visit: Payer: Medicare Other

## 2015-08-01 ENCOUNTER — Ambulatory Visit: Payer: Medicare Other

## 2015-08-01 ENCOUNTER — Inpatient Hospital Stay (HOSPITAL_BASED_OUTPATIENT_CLINIC_OR_DEPARTMENT_OTHER): Payer: Medicare Other | Admitting: Hematology and Oncology

## 2015-08-01 ENCOUNTER — Inpatient Hospital Stay: Payer: Medicare Other

## 2015-08-01 ENCOUNTER — Other Ambulatory Visit: Payer: Self-pay | Admitting: Hematology and Oncology

## 2015-08-01 ENCOUNTER — Inpatient Hospital Stay: Payer: Medicare Other | Attending: Hematology and Oncology

## 2015-08-01 VITALS — BP 156/88 | HR 78 | Temp 99.3°F | Resp 18 | Ht 61.0 in | Wt 117.7 lb

## 2015-08-01 DIAGNOSIS — Z7982 Long term (current) use of aspirin: Secondary | ICD-10-CM | POA: Diagnosis not present

## 2015-08-01 DIAGNOSIS — Z8673 Personal history of transient ischemic attack (TIA), and cerebral infarction without residual deficits: Secondary | ICD-10-CM | POA: Diagnosis not present

## 2015-08-01 DIAGNOSIS — D72821 Monocytosis (symptomatic): Secondary | ICD-10-CM

## 2015-08-01 DIAGNOSIS — E538 Deficiency of other specified B group vitamins: Secondary | ICD-10-CM

## 2015-08-01 DIAGNOSIS — Z79899 Other long term (current) drug therapy: Secondary | ICD-10-CM | POA: Insufficient documentation

## 2015-08-01 DIAGNOSIS — Z950 Presence of cardiac pacemaker: Secondary | ICD-10-CM | POA: Diagnosis not present

## 2015-08-01 DIAGNOSIS — I4891 Unspecified atrial fibrillation: Secondary | ICD-10-CM | POA: Insufficient documentation

## 2015-08-01 DIAGNOSIS — M1990 Unspecified osteoarthritis, unspecified site: Secondary | ICD-10-CM | POA: Insufficient documentation

## 2015-08-01 DIAGNOSIS — D696 Thrombocytopenia, unspecified: Secondary | ICD-10-CM | POA: Insufficient documentation

## 2015-08-01 DIAGNOSIS — E785 Hyperlipidemia, unspecified: Secondary | ICD-10-CM | POA: Diagnosis not present

## 2015-08-01 DIAGNOSIS — I442 Atrioventricular block, complete: Secondary | ICD-10-CM | POA: Insufficient documentation

## 2015-08-01 DIAGNOSIS — I10 Essential (primary) hypertension: Secondary | ICD-10-CM | POA: Diagnosis not present

## 2015-08-01 LAB — CBC WITH DIFFERENTIAL/PLATELET
Basophils Absolute: 0 10*3/uL (ref 0–0.1)
Basophils Relative: 0 %
Eosinophils Absolute: 0.1 10*3/uL (ref 0–0.7)
Eosinophils Relative: 1 %
HCT: 30.8 % — ABNORMAL LOW (ref 35.0–47.0)
Hemoglobin: 10.3 g/dL — ABNORMAL LOW (ref 12.0–16.0)
Lymphocytes Relative: 29 %
Lymphs Abs: 2 10*3/uL (ref 1.0–3.6)
MCH: 28.7 pg (ref 26.0–34.0)
MCHC: 33.4 g/dL (ref 32.0–36.0)
MCV: 86 fL (ref 80.0–100.0)
Monocytes Absolute: 1.8 10*3/uL — ABNORMAL HIGH (ref 0.2–0.9)
Monocytes Relative: 26 %
Neutro Abs: 3 10*3/uL (ref 1.4–6.5)
Neutrophils Relative %: 44 %
Platelets: 65 10*3/uL — ABNORMAL LOW (ref 150–440)
RBC: 3.58 MIL/uL — ABNORMAL LOW (ref 3.80–5.20)
RDW: 16.4 % — ABNORMAL HIGH (ref 11.5–14.5)
WBC: 7 10*3/uL (ref 3.6–11.0)

## 2015-08-01 MED ORDER — CYANOCOBALAMIN 1000 MCG/ML IJ SOLN
1000.0000 ug | Freq: Once | INTRAMUSCULAR | Status: AC
  Administered 2015-08-01: 1000 ug via INTRAMUSCULAR
  Filled 2015-08-01: qty 1

## 2015-08-01 NOTE — Progress Notes (Signed)
[facility] day: 08/01/2015   Chief Complaint: [name] is a 82 y.o. 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  HPI: The patient  was last seen in the medical oncology clinic on 05/01/2015.  At that time, she was seen for 2 month assessment.  At that time, she felt weak.  Platelet count was 106,000.  She discussed her likely diagnosis of CMML and plan for ongoing supportive care.  She received B12.  She has continued monthly B12 (last 07/03/2015).  She has had follow-up labs.  CBC on 05/31/2015 revealed a hematocrit of 31.2, hemoglobin 10.2, platelets 101,000, WBC 6,000 with an ANC of 2800 and an absolute monocyte count of 1400.   CBC on 07/03/2015 revealed a hematocrit of 31.8, hemoglobin 10.6, platelets 62,000, WBC 6,600 with an ANC of 3200 and an absolute monocyte count of 1500.  Symptomatically, she notes fatigue.  She states that she lays down every afternoon.  She denies any excess bruising or bleeding.  She is on a baby aspirin a day.  She has had no interval infections.  Past Medical History  Diagnosis Date  . HTN (hypertension)   . Syncope   . Hyperlipidemia   . Ischemic colitis ([place])   . Thrombocytopenia ([place])   . Mobitz type 2 second degree heart block   . Anemia   . Pacemaker   . Arthritis   . Arrhythmia   . Atrial fibrillation ([place])   . Stroke ([place])   . Acquired complete AV block ([place]) 06/22/2012    Overview:  While on atenolol.  Again on diltiazem. Pacemaker placed 06/2012.   . BP (high blood pressure) 03/03/2015  . Complete atrioventricular block ([place]) 06/22/2012    Overview:  While on atenolol.  Again on diltiazem. Pacemaker placed 06/2012.   . Essential hypertension 03/01/2015  . Supraventricular tachycardia ([place]) 06/22/2012    Overview:  Short bursts at rates up to 150bpm.   . Viral upper respiratory infection 06/14/2015  . UTI  (lower urinary tract infection) 01/12/2015    Past Surgical History  Procedure Laterality Date  . Left hip replacement     . Cholecystectomy    . Pacemaker placement      Family History  Problem Relation Age of Onset  . Stroke Mother   . Alcoholism Father   . Hypertension Sister   . Prostate cancer Neg Hx   . Bladder Cancer Neg Hx   . Kidney cancer Neg Hx     Social History:  reports that she has never smoked. She has never used smokeless tobacco. She reports that she drinks about 0.6 oz of alcohol per week. She reports that she does not use illicit drugs.  The patient is accompanied by her daughter today.  Allergies:  Allergies  Allergen Reactions  . Atenolol Other (See Comments)    Reaction:  Complete Heart Block  . Penicillins Other (See Comments)    rash  . Dilaudid [Hydromorphone Hcl] Rash  . Hydromorphone Rash    Current Medications: Current Outpatient Prescriptions  Medication Sig Dispense Refill  . aspirin EC 81 MG tablet Take 81 mg by mouth daily.    . carvedilol (COREG) 12.5 MG tablet Take 12.5 mg by mouth 2 (two) times daily.    . furosemide (LASIX) 20 MG tablet Take 10 mg by mouth daily as needed for edema.     . hydrALAZINE (  APRESOLINE) 10 MG tablet Take 10 mg by mouth 2 (two) times daily.     Marland Kitchen HYDROcodone-acetaminophen (NORCO/VICODIN) 5-325 MG per tablet Take 1 tablet by mouth 4 (four) times daily as needed for moderate pain.    Marland Kitchen latanoprost (XALATAN) 0.005 % ophthalmic solution Place 1 drop into both eyes at bedtime.    Marland Kitchen losartan (COZAAR) 100 MG tablet Take 50 mg by mouth 2 (two) times daily.    . Multiple Vitamin (MULTIVITAMIN WITH MINERALS) TABS tablet Take 1 tablet by mouth daily.    . Multiple Vitamins-Minerals (PRESERVISION AREDS 2) CAPS Take 2 capsules by mouth daily. Reported on 06/22/2015    . Omega-3 Fatty Acids (FISH OIL) 1000 MG CAPS Take 2,000 mg by mouth daily.     . predniSONE (DELTASONE) 10 MG tablet Please take 50 mg (5 tablets) by mouth  today, then decrease by one tablet by mouth daily until gone 15 tablet 0  . ZINC OXIDE, TOPICAL, 10 % CREA Apply 1 application topically 2 (two) times daily. To affected area 113 g 3   No current facility-administered medications for this visit.    Review of Systems:  GENERAL:  Fatigue.  No fevers or sweats.  Weight stable. PERFORMANCE STATUS (ECOG):  2 HEENT:  No visual changes, runny nose, sore throat, mouth sores or tenderness. Lungs:  No shortness of breath or cough.  No hemoptysis. Cardiac:  No chest pain, palpitations, orthopnea, or PND. GI:  Eating well.  No nausea, vomiting, diarrhea, constipation, melena or hematochezia. GU:  Urinary retention with Foley catheter.  No urgency, frequency, dysuria, or hematuria. Musculoskeletal:  Back and hip pain.  No muscle tenderness. Extremities:  No pain or swelling. Skin:  No rashes or skin changes. Neuro:  No headache, numbness or weakness, balance or coordination issues. Endocrine:  No diabetes, thyroid issues, hot flashes or night sweats. Psych:  No mood changes, depression or anxiety. Pain:  No focal pain. Review of systems:  All other systems reviewed and found to be negative.  Physical Exam: Blood pressure 156/88, pulse 78, temperature 99.3 F (37.4 C), temperature source Tympanic, resp. rate 18, height 5\' 1"  (1.549 m), weight 117 lb 11.6 oz (53.4 kg). GENERAL:  Thin elderly woman sitting comfortably in a wheelchair in the exam room in no acute distress. MENTAL STATUS:  Alert and oriented to person, place and time. HEAD:  Short blonde hair.  Normocephalic, atraumatic, face symmetric, no Cushingoid features. EYES:  Glasses.  Brown eyes.  Pupils equal round and reactive to light and accomodation.  No conjunctivitis or scleral icterus. ENT:  Oropharynx clear without lesion. Dentures. Tongue normal. Mucous membranes moist.  RESPIRATORY:  Clear to auscultation without rales, wheezes or rhonchi. CARDIOVASCULAR:  Regular rate and rhythm  without murmur, rub or gallop. ABDOMEN:  Soft, non-tender, with active bowel sounds, and no hepatosplenomegaly.  No masses. GENITOURINARY:  Foley catheter in place. SKIN:  Scattered small bruises.  No rashes, ulcers or lesions. EXTREMITIES: No edema, no skin discoloration or tenderness.  No palpable cords. LYMPH NODES: No palpable cervical, supraclavicular, axillary or inguinal adenopathy  NEUROLOGICAL: Unremarkable. PSYCH:  Appropriate.  Appointment on 08/01/2015  Component Date Value Ref Range Status  . WBC 08/01/2015 7.0  3.6 - 11.0 K/uL Final  . RBC 08/01/2015 3.58* 3.80 - 5.20 MIL/uL Final  . Hemoglobin 08/01/2015 10.3* 12.0 - 16.0 g/dL Final  . HCT 08/03/2015 30.8* 35.0 - 47.0 % Final  . MCV 08/01/2015 86.0  80.0 - 100.0 fL Final  .  MCH 08/01/2015 28.7  26.0 - 34.0 pg Final  . MCHC 08/01/2015 33.4  32.0 - 36.0 g/dL Final  . RDW 08/01/2015 16.4* 11.5 - 14.5 % Final  . Platelets 08/01/2015 65* 150 - 440 K/uL Final  . Neutrophils Relative % 08/01/2015 44   Final  . Neutro Abs 08/01/2015 3.0  1.4 - 6.5 K/uL Final  . Lymphocytes Relative 08/01/2015 29   Final  . Lymphs Abs 08/01/2015 2.0  1.0 - 3.6 K/uL Final  . Monocytes Relative 08/01/2015 26   Final  . Monocytes Absolute 08/01/2015 1.8* 0.2 - 0.9 K/uL Final  . Eosinophils Relative 08/01/2015 1   Final  . Eosinophils Absolute 08/01/2015 0.1  0 - 0.7 K/uL Final  . Basophils Relative 08/01/2015 0   Final  . Basophils Absolute 08/01/2015 0.0  0 - 0.1 K/uL Final    Assessment:  Keaja Reaume is a 82 y.o. 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  She has a history of recurrent E coli UTI and associated bacteremia. She has a history of thrombocytopenia secondary to consumption associated with infection. She has poor marrow reserve secondary to her age.She has been treated with Septra, known myelosuppressant agent.  Work-up on 02/03/2015 revealed a low normal B12 (284) with an elevated MMA (390)  consistent with a B12 deficiency.  Folate was normal (20.2) on 02/03/2015.  SPEP on 02/03/2015 revealed no monoclonal protein.  Free light chains were negative.  ANA was negative. Coagulation studies were normal.  Creatinine was 0.87.  She began B12 supplimentation on 02/17/2015.  She receives B12 monthly (last 07/03/2015).  Peripheral smear on 02/02/2015 revealed marked thrombocytopenia, scattered teardrop cells, and rare schistocytes. BCR-ABL on 02/05/2015 was negative, ruling out CML.  Flow cytometry on 02/05/2015 revealed absolute monocytosis with phenotypic aberrancies.  There was dim partial aberrant upregulation of CD56 and mild down-regulation of CD11b, CD11c and HLA-DR. Changes can be associated with reactive monocytosis and  myelodysplastic/myeloproliferative disorders.    Symptomatically, she remains fatigued.  Her performance status is modest.  Platelet count has decreased in the past 2 months (62,000-65,000).  Plan: 1.  Labs today:  CBC with diff. 2.  Review underlying diagnosis of probable CMML.  Discuss ongoing supportive care.  Patient does not want bone marrow for diagnosis or any treatment except for supportive care.  If platelets problematic, desires platelet transfusion.  3.  B12 today and monthly. 4.  RTC in 1 month for CBC with diff. 5.  RTC in 2 months for MD assess, labs (CBC with diff), and B12.   Lequita Asal, MD  08/01/2015, 3:18 PM

## 2015-08-03 DIAGNOSIS — L820 Inflamed seborrheic keratosis: Secondary | ICD-10-CM | POA: Diagnosis not present

## 2015-08-03 DIAGNOSIS — Z1283 Encounter for screening for malignant neoplasm of skin: Secondary | ICD-10-CM | POA: Diagnosis not present

## 2015-08-03 DIAGNOSIS — D1801 Hemangioma of skin and subcutaneous tissue: Secondary | ICD-10-CM | POA: Diagnosis not present

## 2015-08-03 DIAGNOSIS — L812 Freckles: Secondary | ICD-10-CM | POA: Diagnosis not present

## 2015-08-03 DIAGNOSIS — L821 Other seborrheic keratosis: Secondary | ICD-10-CM | POA: Diagnosis not present

## 2015-08-03 DIAGNOSIS — D225 Melanocytic nevi of trunk: Secondary | ICD-10-CM | POA: Diagnosis not present

## 2015-08-04 ENCOUNTER — Encounter: Payer: Self-pay | Admitting: Hematology and Oncology

## 2015-08-10 ENCOUNTER — Ambulatory Visit (INDEPENDENT_AMBULATORY_CARE_PROVIDER_SITE_OTHER): Payer: Medicare Other | Admitting: Family Medicine

## 2015-08-10 ENCOUNTER — Encounter: Payer: Self-pay | Admitting: Family Medicine

## 2015-08-10 VITALS — BP 132/78 | HR 86 | Temp 97.8°F | Ht 61.0 in | Wt 117.8 lb

## 2015-08-10 DIAGNOSIS — E871 Hypo-osmolality and hyponatremia: Secondary | ICD-10-CM | POA: Diagnosis not present

## 2015-08-10 DIAGNOSIS — L89322 Pressure ulcer of left buttock, stage 2: Secondary | ICD-10-CM | POA: Diagnosis not present

## 2015-08-10 DIAGNOSIS — I10 Essential (primary) hypertension: Secondary | ICD-10-CM | POA: Diagnosis not present

## 2015-08-10 DIAGNOSIS — J069 Acute upper respiratory infection, unspecified: Secondary | ICD-10-CM

## 2015-08-10 DIAGNOSIS — D696 Thrombocytopenia, unspecified: Secondary | ICD-10-CM | POA: Diagnosis not present

## 2015-08-10 LAB — BASIC METABOLIC PANEL
BUN: 12 mg/dL (ref 6–23)
CO2: 25 meq/L (ref 19–32)
Calcium: 9.7 mg/dL (ref 8.4–10.5)
Chloride: 102 mEq/L (ref 96–112)
Creatinine, Ser: 1.11 mg/dL (ref 0.40–1.20)
GFR: 48.96 mL/min — ABNORMAL LOW (ref >60.00)
GLUCOSE: 98 mg/dL (ref 70–99)
POTASSIUM: 5.1 meq/L (ref 3.5–5.1)
SODIUM: 134 meq/L — AB (ref 135–145)

## 2015-08-10 NOTE — Assessment & Plan Note (Signed)
Well healed. We'll continue to monitor. Given return precautions.

## 2015-08-10 NOTE — Assessment & Plan Note (Signed)
Followed by oncology. She will continue to follow-up with oncology.

## 2015-08-10 NOTE — Assessment & Plan Note (Signed)
Much improved. Completed prednisone taper. She'll continue to monitor for recurrence.

## 2015-08-10 NOTE — Patient Instructions (Signed)
Nice to see you. You're doing fantastic. We will check her sodium today. Please continue to follow-up with the oncologist. If you develop cough, trouble breathing, chest pain, fevers, recurrence of your pressure ulcers, or any new or changing symptoms please seek medical attention.

## 2015-08-10 NOTE — Progress Notes (Signed)
Pre visit review using our clinic review tool, if applicable. No additional management support is needed unless otherwise documented below in the visit note. 

## 2015-08-10 NOTE — Progress Notes (Signed)
Patient ID: [name], female   DOB: 1922-07-26, 82 y.o.   MRN: [id number]  Tommi Rumps, MD Phone: [phone]  [name] is a 82 y.o. female who presents today for follow-up.  HYPERTENSION Disease Monitoring Home BP Monitoring not checking frequently Chest pain- no    Dyspnea- no Medications Compliance-  taking Coreg, hydralazine, and losartan, infrequently taking Lasix. Lightheadedness-  no  Edema- no  Patient notes her cough is significantly better. Prednisone helped. Very infrequent cough now. No shortness of breath.  Pressure ulcers: Notes these are completely healed. She saw wound care. They had been dressing them. Have not recurred. Not currently using any dressing.  Patient's followed by oncology for anemia and thrombocytopenia. Notes her platelets of decreased down into the 60,000 range. Reports that they are going to consider platelet transfusion if she drops into the 35,000 range. She does note some tiredness though takes a nap and feels better with this. She is followed by oncology for this issue.  PMH: nonsmoker.   ROS see history of present illness  Objective  Physical Exam Filed Vitals:   08/10/15 1046  BP: 132/78  Pulse: 86  Temp: 97.8 F (36.6 C)    BP Readings from Last 3 Encounters:  08/10/15 132/78  08/01/15 156/88  06/23/15 130/76   Wt Readings from Last 3 Encounters:  08/10/15 117 lb 12.8 oz (53.434 kg)  08/01/15 117 lb 11.6 oz (53.4 kg)  06/23/15 115 lb (52.164 kg)    Physical Exam  Constitutional: No distress.  Cardiovascular: Normal rate, regular rhythm and normal heart sounds.   Pulmonary/Chest: Effort normal and breath sounds normal.  Musculoskeletal: She exhibits no edema.  Neurological: She is alert.  Skin: Skin is warm and dry. She is not diaphoretic.  Bilateral buttocks with no skin changes     Assessment/Plan: Please see individual problem list.  Essential hypertension Blood pressure at goal. We will continue her  current medication regimen. She is noted to be hyponatremic on review of her labs. We will repeat a BMP today to determine if there need to be any medication changes.  Stage II pressure ulcer of left buttock Well healed. We'll continue to monitor. Given return precautions.  Thrombocytopenia Followed by oncology. She will continue to follow-up with oncology.  Viral upper respiratory infection Much improved. Completed prednisone taper. She'll continue to monitor for recurrence.    Orders Placed This Encounter  Procedures  . Basic Metabolic Panel (BMET)     Tommi Rumps, MD [place]

## 2015-08-10 NOTE — Assessment & Plan Note (Signed)
Blood pressure at goal. We will continue her current medication regimen. She is noted to be hyponatremic on review of her labs. We will repeat a BMP today to determine if there need to be any medication changes.

## 2015-08-11 ENCOUNTER — Telehealth: Payer: Self-pay | Admitting: *Deleted

## 2015-08-11 NOTE — Telephone Encounter (Signed)
Notified patients daughter of lab results. 

## 2015-08-11 NOTE — Telephone Encounter (Signed)
Patient has requested lab results from 02/10/16.  Pt. Contact [phone]

## 2015-08-12 ENCOUNTER — Other Ambulatory Visit: Payer: Self-pay

## 2015-08-12 DIAGNOSIS — E538 Deficiency of other specified B group vitamins: Secondary | ICD-10-CM

## 2015-08-15 ENCOUNTER — Ambulatory Visit: Payer: Medicare Other | Admitting: Sports Medicine

## 2015-08-22 ENCOUNTER — Ambulatory Visit (INDEPENDENT_AMBULATORY_CARE_PROVIDER_SITE_OTHER): Payer: Medicare Other

## 2015-08-22 DIAGNOSIS — R339 Retention of urine, unspecified: Secondary | ICD-10-CM

## 2015-08-22 NOTE — Progress Notes (Signed)
Cath Change/ Replacement  Patient is present today for a catheter change due to urinary retention.  79ml of water was removed from the balloon, a 16FR foley cath was removed with out difficulty.  Patient was cleaned and prepped in a sterile fashion with betadine and 2% lidocaine jelly was instilled into the urethra. A 16 FR foley cath was replaced into the bladder no complications were noted Urine return was noted 74ml and urine was cloudy and yellow in color. The balloon was filled with 67ml of sterile water. A night back bag was attached for drainage.    Preformed by: Toniann Fail, LPN   Follow up: 64mo

## 2015-08-29 ENCOUNTER — Ambulatory Visit: Payer: Medicare Other

## 2015-08-29 ENCOUNTER — Ambulatory Visit (INDEPENDENT_AMBULATORY_CARE_PROVIDER_SITE_OTHER): Payer: Medicare Other | Admitting: Sports Medicine

## 2015-08-29 ENCOUNTER — Encounter: Payer: Self-pay | Admitting: Sports Medicine

## 2015-08-29 ENCOUNTER — Other Ambulatory Visit: Payer: Medicare Other

## 2015-08-29 DIAGNOSIS — M79674 Pain in right toe(s): Secondary | ICD-10-CM | POA: Diagnosis not present

## 2015-08-29 DIAGNOSIS — M79675 Pain in left toe(s): Secondary | ICD-10-CM | POA: Diagnosis not present

## 2015-08-29 DIAGNOSIS — B351 Tinea unguium: Secondary | ICD-10-CM

## 2015-08-29 DIAGNOSIS — M2040 Other hammer toe(s) (acquired), unspecified foot: Secondary | ICD-10-CM

## 2015-08-29 DIAGNOSIS — M21619 Bunion of unspecified foot: Secondary | ICD-10-CM

## 2015-08-29 NOTE — Progress Notes (Signed)
Patient ID: [name], female   DOB: 12/19/1922, 82 y.o.   MRN: [id number]  Subjective: [name] is a 82 y.o. female patient seen today in office with complaint of painful thickened and elongated toenails; unable to trim. Patient denies any changes with health since last visit. Patient has no other pedal complaints at this time.   Patient Active Problem List   Diagnosis Date Noted  . Viral upper respiratory infection 06/14/2015  . Status post left hip replacement 05/22/2015  . Anemia 05/01/2015  . Right knee injury 04/25/2015  . Low back pain 04/25/2015  . Ischemic colitis ([place]) 04/20/2015  . Bad odor of urine 03/17/2015  . Stage II pressure ulcer of left buttock 03/13/2015  . Artificial cardiac pacemaker 03/03/2015  . HLD (hyperlipidemia) 03/03/2015  . BP (high blood pressure) 03/03/2015  . Colitis, ischemic ([place]) 03/03/2015  . Candidal intertrigo, inguinal 03/01/2015  . Essential hypertension 03/01/2015  . Monocytosis 02/09/2015  . Thrombocytopenia ([place]) 02/09/2015  . B12 deficiency 02/09/2015  . Urinary retention 02/09/2015  . Pressure ulcer of left coccygeal region, stage 2 02/09/2015  . Bacteremia 01/12/2015  . UTI (lower urinary tract infection) 01/12/2015  . Acquired complete AV block ([place]) 06/22/2012  . Supraventricular tachycardia ([place]) 06/22/2012  . Complete atrioventricular block ([place]) 06/22/2012   Current Outpatient Prescriptions on File Prior to Visit  Medication Sig Dispense Refill  . aspirin EC 81 MG tablet Take 81 mg by mouth daily.    . carvedilol (COREG) 12.5 MG tablet Take 12.5 mg by mouth 2 (two) times daily.    . furosemide (LASIX) 20 MG tablet Take 10 mg by mouth daily as needed for edema.     . hydrALAZINE (APRESOLINE) 10 MG tablet Take 10 mg by mouth 2 (two) times daily.     Marland Kitchen HYDROcodone-acetaminophen (NORCO/VICODIN) 5-325 MG per tablet Take 1 tablet by mouth 4 (four) times daily as needed for moderate pain.    Marland Kitchen latanoprost (XALATAN) 0.005 %  ophthalmic solution Place 1 drop into both eyes at bedtime.    Marland Kitchen losartan (COZAAR) 100 MG tablet Take 50 mg by mouth 2 (two) times daily.    . Multiple Vitamin (MULTIVITAMIN WITH MINERALS) TABS tablet Take 1 tablet by mouth daily.    . Multiple Vitamins-Minerals (PRESERVISION AREDS 2) CAPS Take 2 capsules by mouth daily. Reported on 06/22/2015    . Omega-3 Fatty Acids (FISH OIL) 1000 MG CAPS Take 2,000 mg by mouth daily.     . predniSONE (DELTASONE) 10 MG tablet Please take 50 mg (5 tablets) by mouth today, then decrease by one tablet by mouth daily until gone 15 tablet 0  . ZINC OXIDE, TOPICAL, 10 % CREA Apply 1 application topically 2 (two) times daily. To affected area 113 g 3   No current facility-administered medications on file prior to visit.      Allergies  Allergen Reactions  . Atenolol Other (See Comments)    Reaction:  Complete Heart Block  . Penicillins Other (See Comments)    rash  . Dilaudid [Hydromorphone Hcl] Rash  . Hydromorphone Rash     Objective: Physical Exam  General: Well developed, nourished, no acute distress, awake, alert and oriented x 3. Walker assisted gait.   Vascular: Dorsalis pedis artery 1/4 bilateral, Posterior tibial artery 1/4 bilateral, skin temperature warm to warm proximal to distal bilateral lower extremities, + varicosities, scant pedal hair present bilateral.  Neurological: Gross sensation present via light touch bilateral.   Dermatological: Skin is warm, dry,  and supple bilateral, Nails 1-10 are tender, long, thick, and discolored with mild subungal debris, no webspace macerations present bilateral, no open lesions present bilateral, no callus/corns/hyperkeratotic tissue present bilateral. No signs of infection bilateral.  Musculoskeletal: Asymptomatic bunion and hammertoe boney deformities noted bilateral. Muscular strength within normal limits without pain on range of motion. No pain with calf compression bilateral.  Assessment and Plan:   Problem List Items Addressed This Visit    None    Visit Diagnoses    Dermatophytosis of nail    -  Primary    Pain in toes of both feet        Bunion        Hammer toe, unspecified laterality          -Examined patient.  -Discussed treatment options for painful mycotic nails. -Mechanically debrided and reduced mycotic nails with sterile nail nipper and dremel nail file without incident. -Recommend good supportive shoes for foot type. -Patient to return in 3 months for follow up evaluation or sooner if symptoms worsen.  Landis Martins, DPM

## 2015-08-31 ENCOUNTER — Inpatient Hospital Stay: Payer: Medicare Other

## 2015-08-31 ENCOUNTER — Inpatient Hospital Stay: Payer: Medicare Other | Attending: Hematology and Oncology

## 2015-08-31 DIAGNOSIS — D72821 Monocytosis (symptomatic): Secondary | ICD-10-CM | POA: Diagnosis not present

## 2015-08-31 DIAGNOSIS — E538 Deficiency of other specified B group vitamins: Secondary | ICD-10-CM | POA: Diagnosis not present

## 2015-08-31 DIAGNOSIS — D696 Thrombocytopenia, unspecified: Secondary | ICD-10-CM | POA: Insufficient documentation

## 2015-08-31 DIAGNOSIS — Z79899 Other long term (current) drug therapy: Secondary | ICD-10-CM | POA: Insufficient documentation

## 2015-08-31 LAB — CBC WITH DIFFERENTIAL/PLATELET
Basophils Absolute: 0 10*3/uL (ref 0–0.1)
Basophils Relative: 1 %
Eosinophils Absolute: 0.1 10*3/uL (ref 0–0.7)
Eosinophils Relative: 1 %
HCT: 31.5 % — ABNORMAL LOW (ref 35.0–47.0)
Hemoglobin: 10.7 g/dL — ABNORMAL LOW (ref 12.0–16.0)
Lymphocytes Relative: 30 %
Lymphs Abs: 1.8 10*3/uL (ref 1.0–3.6)
MCH: 29.4 pg (ref 26.0–34.0)
MCHC: 34 g/dL (ref 32.0–36.0)
MCV: 86.6 fL (ref 80.0–100.0)
Monocytes Absolute: 1.4 10*3/uL — ABNORMAL HIGH (ref 0.2–0.9)
Monocytes Relative: 23 %
Neutro Abs: 2.8 10*3/uL (ref 1.4–6.5)
Neutrophils Relative %: 45 %
Platelets: 73 10*3/uL — ABNORMAL LOW (ref 150–440)
RBC: 3.64 MIL/uL — ABNORMAL LOW (ref 3.80–5.20)
RDW: 15.4 % — ABNORMAL HIGH (ref 11.5–14.5)
WBC: 6.1 10*3/uL (ref 3.6–11.0)

## 2015-08-31 MED ORDER — CYANOCOBALAMIN 1000 MCG/ML IJ SOLN
1000.0000 ug | Freq: Once | INTRAMUSCULAR | Status: AC
  Administered 2015-08-31: 1000 ug via INTRAMUSCULAR
  Filled 2015-08-31: qty 1

## 2015-09-19 ENCOUNTER — Ambulatory Visit (INDEPENDENT_AMBULATORY_CARE_PROVIDER_SITE_OTHER): Payer: Medicare Other

## 2015-09-19 DIAGNOSIS — R339 Retention of urine, unspecified: Secondary | ICD-10-CM

## 2015-09-19 NOTE — Progress Notes (Signed)
Cath Change/ Replacement  Patient is present today for a catheter change due to urinary retention.  1ml of water was removed from the balloon, a 16FR foley cath was removed with out difficulty.  Patient was cleaned and prepped in a sterile fashion with betadine and 2% lidocaine jelly was instilled into the urethra. A 16 FR foley cath was replaced into the bladder no complications were noted Urine return was noted 55ml and urine was yellow in color. The balloon was filled with 24ml of sterile water. A night bag was attached for drainage. Patient was given proper instruction on catheter care.    Preformed by: Toniann Fail, LPN   Follow up: 1 mo

## 2015-09-28 ENCOUNTER — Inpatient Hospital Stay: Payer: Medicare Other | Attending: Hematology and Oncology

## 2015-09-28 ENCOUNTER — Inpatient Hospital Stay: Payer: Medicare Other

## 2015-09-28 ENCOUNTER — Other Ambulatory Visit: Payer: Self-pay | Admitting: Hematology and Oncology

## 2015-09-28 ENCOUNTER — Inpatient Hospital Stay (HOSPITAL_BASED_OUTPATIENT_CLINIC_OR_DEPARTMENT_OTHER): Payer: Medicare Other | Admitting: Hematology and Oncology

## 2015-09-28 VITALS — BP 147/80 | HR 80 | Temp 97.9°F | Resp 18 | Wt 112.5 lb

## 2015-09-28 DIAGNOSIS — I471 Supraventricular tachycardia: Secondary | ICD-10-CM | POA: Diagnosis not present

## 2015-09-28 DIAGNOSIS — E538 Deficiency of other specified B group vitamins: Secondary | ICD-10-CM

## 2015-09-28 DIAGNOSIS — M1990 Unspecified osteoarthritis, unspecified site: Secondary | ICD-10-CM | POA: Diagnosis not present

## 2015-09-28 DIAGNOSIS — Z950 Presence of cardiac pacemaker: Secondary | ICD-10-CM | POA: Diagnosis not present

## 2015-09-28 DIAGNOSIS — Z7982 Long term (current) use of aspirin: Secondary | ICD-10-CM | POA: Diagnosis not present

## 2015-09-28 DIAGNOSIS — R5383 Other fatigue: Secondary | ICD-10-CM | POA: Insufficient documentation

## 2015-09-28 DIAGNOSIS — I442 Atrioventricular block, complete: Secondary | ICD-10-CM | POA: Diagnosis not present

## 2015-09-28 DIAGNOSIS — I4891 Unspecified atrial fibrillation: Secondary | ICD-10-CM | POA: Diagnosis not present

## 2015-09-28 DIAGNOSIS — I10 Essential (primary) hypertension: Secondary | ICD-10-CM | POA: Diagnosis not present

## 2015-09-28 DIAGNOSIS — Z8673 Personal history of transient ischemic attack (TIA), and cerebral infarction without residual deficits: Secondary | ICD-10-CM | POA: Insufficient documentation

## 2015-09-28 DIAGNOSIS — E785 Hyperlipidemia, unspecified: Secondary | ICD-10-CM | POA: Diagnosis not present

## 2015-09-28 DIAGNOSIS — D696 Thrombocytopenia, unspecified: Secondary | ICD-10-CM | POA: Diagnosis not present

## 2015-09-28 DIAGNOSIS — Z96642 Presence of left artificial hip joint: Secondary | ICD-10-CM | POA: Diagnosis not present

## 2015-09-28 DIAGNOSIS — D72821 Monocytosis (symptomatic): Secondary | ICD-10-CM

## 2015-09-28 DIAGNOSIS — Z79899 Other long term (current) drug therapy: Secondary | ICD-10-CM | POA: Diagnosis not present

## 2015-09-28 LAB — CBC WITH DIFFERENTIAL/PLATELET
Basophils Absolute: 0 10*3/uL (ref 0–0.1)
Basophils Relative: 1 %
Eosinophils Absolute: 0 10*3/uL (ref 0–0.7)
Eosinophils Relative: 1 %
HCT: 32.6 % — ABNORMAL LOW (ref 35.0–47.0)
Hemoglobin: 11.1 g/dL — ABNORMAL LOW (ref 12.0–16.0)
Lymphocytes Relative: 31 %
Lymphs Abs: 1.7 10*3/uL (ref 1.0–3.6)
MCH: 29.2 pg (ref 26.0–34.0)
MCHC: 34 g/dL (ref 32.0–36.0)
MCV: 86 fL (ref 80.0–100.0)
Monocytes Absolute: 1.1 10*3/uL — ABNORMAL HIGH (ref 0.2–0.9)
Monocytes Relative: 21 %
Neutro Abs: 2.5 10*3/uL (ref 1.4–6.5)
Neutrophils Relative %: 46 %
Platelets: 73 10*3/uL — ABNORMAL LOW (ref 150–440)
RBC: 3.79 MIL/uL — ABNORMAL LOW (ref 3.80–5.20)
RDW: 15 % — ABNORMAL HIGH (ref 11.5–14.5)
WBC: 5.4 10*3/uL (ref 3.6–11.0)

## 2015-09-28 MED ORDER — CYANOCOBALAMIN 1000 MCG/ML IJ SOLN
1000.0000 ug | Freq: Once | INTRAMUSCULAR | Status: AC
  Administered 2015-09-28: 1000 ug via INTRAMUSCULAR
  Filled 2015-09-28: qty 1

## 2015-09-28 NOTE — Progress Notes (Signed)
[facility] day: 09/28/2015  Chief Complaint: Lynnex Fulp is a 82 y.o. 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  HPI: The patient  was last seen in the medical oncology clinic on 08/01/2015.  At that time, she remained fatigued.  Her performance status was modest.  Platelet count had decreased in the prior 2 months (62,000-65,000).  She received her B12.  During the interim, she has felt "tired but good".  She denies any bruising or bleeding.  She has had no interval infections.   Past Medical History  Diagnosis Date  . HTN (hypertension)   . Syncope   . Hyperlipidemia   . Ischemic colitis ([place])   . Thrombocytopenia ([place])   . Mobitz type 2 second degree heart block   . Anemia   . Pacemaker   . Arthritis   . Arrhythmia   . Atrial fibrillation ([place])   . Stroke ([place])   . Acquired complete AV block ([place]) 06/22/2012    Overview:  While on atenolol.  Again on diltiazem. Pacemaker placed 06/2012.   . BP (high blood pressure) 03/03/2015  . Complete atrioventricular block ([place]) 06/22/2012    Overview:  While on atenolol.  Again on diltiazem. Pacemaker placed 06/2012.   . Essential hypertension 03/01/2015  . Supraventricular tachycardia ([place]) 06/22/2012    Overview:  Short bursts at rates up to 150bpm.   . Viral upper respiratory infection 06/14/2015  . UTI (lower urinary tract infection) 01/12/2015    Past Surgical History  Procedure Laterality Date  . Left hip replacement     . Cholecystectomy    . Pacemaker placement      Family History  Problem Relation Age of Onset  . Stroke Mother   . Alcoholism Father   . Hypertension Sister   . Prostate cancer Neg Hx   . Bladder Cancer Neg Hx   . Kidney cancer Neg Hx     Social History:  reports that she has never smoked. She has never used smokeless tobacco. She reports that she drinks about 0.6 oz of  alcohol per week. She reports that she does not use illicit drugs.  The patient is accompanied by her 2 women today.  Allergies:  Allergies  Allergen Reactions  . Atenolol Other (See Comments)    Reaction:  Complete Heart Block  . Penicillins Other (See Comments)    rash  . Dilaudid [Hydromorphone Hcl] Rash  . Hydromorphone Rash    Current Medications: Current Outpatient Prescriptions  Medication Sig Dispense Refill  . aspirin EC 81 MG tablet Take 81 mg by mouth daily.    . carvedilol (COREG) 12.5 MG tablet Take 12.5 mg by mouth 2 (two) times daily.    . furosemide (LASIX) 20 MG tablet Take 10 mg by mouth daily as needed for edema.     . hydrALAZINE (APRESOLINE) 10 MG tablet Take 10 mg by mouth 2 (two) times daily.     Marland Kitchen HYDROcodone-acetaminophen (NORCO/VICODIN) 5-325 MG per tablet Take 1 tablet by mouth 4 (four) times daily as needed for moderate pain.    Marland Kitchen latanoprost (XALATAN) 0.005 % ophthalmic solution Place 1 drop into both eyes at bedtime.    Marland Kitchen losartan (COZAAR) 100 MG tablet Take 50 mg by mouth 2 (two) times daily.    . Multiple Vitamin (MULTIVITAMIN WITH MINERALS) TABS tablet Take 1 tablet by mouth daily.    Marland Kitchen  Multiple Vitamins-Minerals (PRESERVISION AREDS 2) CAPS Take 2 capsules by mouth daily. Reported on 06/22/2015    . Omega-3 Fatty Acids (FISH OIL) 1000 MG CAPS Take 2,000 mg by mouth daily.     . predniSONE (DELTASONE) 10 MG tablet Please take 50 mg (5 tablets) by mouth today, then decrease by one tablet by mouth daily until gone 15 tablet 0  . ZINC OXIDE, TOPICAL, 10 % CREA Apply 1 application topically 2 (two) times daily. To affected area 113 g 3   No current facility-administered medications for this visit.    Review of Systems:  GENERAL:  "Tired but good".  No fevers or sweats.  Weight down 5 pounds. PERFORMANCE STATUS (ECOG):  2 HEENT:  No visual changes, runny nose, sore throat, mouth sores or tenderness. Lungs:  No shortness of breath or cough.  No  hemoptysis. Cardiac:  No chest pain, palpitations, orthopnea, or PND.  Pacemaker. GI:  Eating well.  Drinks [employer].  No nausea, vomiting, diarrhea, constipation, melena or hematochezia. GU:  Urinary retention with Foley catheter.  No urgency, frequency, dysuria, or hematuria. Musculoskeletal:  Back and hip pain.  No muscle tenderness. Extremities:  No pain or swelling. Skin:  No rashes or skin changes. Neuro:  No headache, numbness or weakness, balance or coordination issues. Endocrine:  No diabetes, thyroid issues, hot flashes or night sweats. Psych:  No mood changes, depression or anxiety. Pain:  No focal pain. Review of systems:  All other systems reviewed and found to be negative.  Physical Exam: Blood pressure 147/80, pulse 80, temperature 97.9 F (36.6 C), temperature source Oral, resp. rate 18, weight 112 lb 8.7 oz (51.05 kg). GENERAL:  Thin elderly woman sitting comfortably in a wheelchair in the exam room in no acute distress. MENTAL STATUS:  Alert and oriented to person, place and time. HEAD:  Short blonde hair.  Normocephalic, atraumatic, face symmetric, no Cushingoid features. EYES:  Glasses.  Brown eyes.  Pupils equal round and reactive to light and accomodation.  No conjunctivitis or scleral icterus. ENT:  Oropharynx clear without lesion. Dentures. Tongue normal. Mucous membranes moist.  RESPIRATORY:  Clear to auscultation without rales, wheezes or rhonchi. CARDIOVASCULAR:  Regular rate and rhythm without murmur, rub or gallop. ABDOMEN:  Soft, non-tender, with active bowel sounds, and no hepatosplenomegaly.  No masses. GENITOURINARY:  Foley catheter in place. SKIN:  Scattered small bruises.  No rashes, ulcers or lesions. EXTREMITIES: No edema, no skin discoloration or tenderness.  No palpable cords. LYMPH NODES: No palpable cervical, supraclavicular, axillary or inguinal adenopathy  NEUROLOGICAL: Unremarkable. PSYCH:  Appropriate.  Appointment on  09/28/2015  Component Date Value Ref Range Status  . WBC 09/28/2015 5.4  3.6 - 11.0 K/uL Final  . RBC 09/28/2015 3.79* 3.80 - 5.20 MIL/uL Final  . Hemoglobin 09/28/2015 11.1* 12.0 - 16.0 g/dL Final  . HCT 09/28/2015 32.6* 35.0 - 47.0 % Final  . MCV 09/28/2015 86.0  80.0 - 100.0 fL Final  . MCH 09/28/2015 29.2  26.0 - 34.0 pg Final  . MCHC 09/28/2015 34.0  32.0 - 36.0 g/dL Final  . RDW 09/28/2015 15.0* 11.5 - 14.5 % Final  . Platelets 09/28/2015 73* 150 - 440 K/uL Final  . Neutrophils Relative % 09/28/2015 46   Final  . Neutro Abs 09/28/2015 2.5  1.4 - 6.5 K/uL Final  . Lymphocytes Relative 09/28/2015 31   Final  . Lymphs Abs 09/28/2015 1.7  1.0 - 3.6 K/uL Final  . Monocytes Relative 09/28/2015 21  Final  . Monocytes Absolute 09/28/2015 1.1* 0.2 - 0.9 K/uL Final  . Eosinophils Relative 09/28/2015 1   Final  . Eosinophils Absolute 09/28/2015 0.0  0 - 0.7 K/uL Final  . Basophils Relative 09/28/2015 1   Final  . Basophils Absolute 09/28/2015 0.0  0 - 0.1 K/uL Final    Assessment:  Bronte Sabado is a 82 y.o. 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  She has a history of recurrent E coli UTI and associated bacteremia. She has a history of thrombocytopenia secondary to consumption associated with infection. She has poor marrow reserve secondary to her age.She has been treated with Septra, known myelosuppressant agent.  Work-up on 02/03/2015 revealed a low normal B12 (284) with an elevated MMA (390) consistent with a B12 deficiency.  Folate was normal (20.2) on 02/03/2015.  SPEP on 02/03/2015 revealed no monoclonal protein.  Free light chains were negative.  ANA was negative. Coagulation studies were normal.  Creatinine was 0.87.  She began B12 supplimentation on 02/17/2015.  She receives B12 monthly (last 08/31/2015).  Peripheral smear on 02/02/2015 revealed marked thrombocytopenia, scattered teardrop cells, and rare schistocytes. BCR-ABL on 02/05/2015 was  negative, ruling out CML.  Flow cytometry on 02/05/2015 revealed absolute monocytosis with phenotypic aberrancies.  There was dim partial aberrant upregulation of CD56 and mild down-regulation of CD11b, CD11c and HLA-DR. Changes can be associated with reactive monocytosis and  myelodysplastic/myeloproliferative disorders.    Symptomatically, she remains fatigued, but feels good.  Her performance status remains modest.  Platelet count is stable to improved.  Plan: 1.  Labs today:  CBC with diff. 2.  B12 today and monthly. 3.  Review indications for intervention.  Continue to observe. 4.  RTC in 2 month for CBC with diff. 5.  RTC in 4 months for MD assess, labs (CBC with diff), and B12.   Lequita Asal, MD  09/28/2015, 2:46 PM

## 2015-09-30 ENCOUNTER — Encounter: Payer: Self-pay | Admitting: Hematology and Oncology

## 2015-10-19 DIAGNOSIS — I442 Atrioventricular block, complete: Secondary | ICD-10-CM | POA: Diagnosis not present

## 2015-10-24 ENCOUNTER — Ambulatory Visit (INDEPENDENT_AMBULATORY_CARE_PROVIDER_SITE_OTHER): Payer: Medicare Other

## 2015-10-24 DIAGNOSIS — R339 Retention of urine, unspecified: Secondary | ICD-10-CM

## 2015-10-24 NOTE — Progress Notes (Signed)
Cath Change/ Replacement  Patient is present today for a catheter change due to urinary retention.  57ml of water was removed from the balloon, a 16FR foley cath was removed with out difficulty.  Patient was cleaned and prepped in a sterile fashion with betadine and 2% lidocaine jelly was instilled into the urethra. A 16 FR foley cath was replaced into the bladder no complications were noted Urine return was noted 165ml and urine was yellow in color. The balloon was filled with 78ml of sterile water. A night bag was attached for drainage.    Preformed by: Toniann Fail, LPN

## 2015-10-26 ENCOUNTER — Inpatient Hospital Stay: Payer: Medicare Other | Attending: Hematology and Oncology

## 2015-10-26 DIAGNOSIS — D696 Thrombocytopenia, unspecified: Secondary | ICD-10-CM | POA: Insufficient documentation

## 2015-10-26 DIAGNOSIS — D72821 Monocytosis (symptomatic): Secondary | ICD-10-CM | POA: Diagnosis not present

## 2015-10-26 DIAGNOSIS — R5383 Other fatigue: Secondary | ICD-10-CM | POA: Diagnosis not present

## 2015-10-26 DIAGNOSIS — E538 Deficiency of other specified B group vitamins: Secondary | ICD-10-CM | POA: Diagnosis not present

## 2015-10-26 DIAGNOSIS — Z79899 Other long term (current) drug therapy: Secondary | ICD-10-CM | POA: Insufficient documentation

## 2015-10-26 MED ORDER — CYANOCOBALAMIN 1000 MCG/ML IJ SOLN
1000.0000 ug | Freq: Once | INTRAMUSCULAR | Status: AC
  Administered 2015-10-26: 1000 ug via INTRAMUSCULAR
  Filled 2015-10-26: qty 1

## 2015-10-27 ENCOUNTER — Ambulatory Visit: Payer: Medicare Other

## 2015-11-16 ENCOUNTER — Ambulatory Visit (INDEPENDENT_AMBULATORY_CARE_PROVIDER_SITE_OTHER): Payer: Medicare Other | Admitting: Family Medicine

## 2015-11-16 ENCOUNTER — Encounter: Payer: Self-pay | Admitting: Family Medicine

## 2015-11-16 ENCOUNTER — Other Ambulatory Visit: Payer: Self-pay | Admitting: Family Medicine

## 2015-11-16 VITALS — BP 124/72 | HR 92 | Temp 98.5°F | Ht 61.0 in | Wt 118.0 lb

## 2015-11-16 DIAGNOSIS — I442 Atrioventricular block, complete: Secondary | ICD-10-CM

## 2015-11-16 DIAGNOSIS — L89322 Pressure ulcer of left buttock, stage 2: Secondary | ICD-10-CM | POA: Diagnosis not present

## 2015-11-16 DIAGNOSIS — I10 Essential (primary) hypertension: Secondary | ICD-10-CM | POA: Diagnosis not present

## 2015-11-16 DIAGNOSIS — R339 Retention of urine, unspecified: Secondary | ICD-10-CM

## 2015-11-16 DIAGNOSIS — K559 Vascular disorder of intestine, unspecified: Secondary | ICD-10-CM

## 2015-11-16 LAB — BASIC METABOLIC PANEL
BUN: 11 mg/dL (ref 6–23)
CHLORIDE: 100 meq/L (ref 96–112)
CO2: 26 mEq/L (ref 19–32)
CREATININE: 1.03 mg/dL (ref 0.40–1.20)
Calcium: 9.4 mg/dL (ref 8.4–10.5)
GFR: 53.35 mL/min — ABNORMAL LOW (ref >60.00)
Glucose, Bld: 98 mg/dL (ref 70–99)
POTASSIUM: 4.3 meq/L (ref 3.5–5.1)
Sodium: 130 mEq/L — ABNORMAL LOW (ref 135–145)

## 2015-11-16 NOTE — Progress Notes (Signed)
Patient ID: [name], female   DOB: [birth year], 82 y.o.   MRN: [id number]  Tommi Rumps, MD Phone: [phone]  [name] is a 82 y.o. female who presents today for f/u.  HYPERTENSION Disease Monitoring Home BP Monitoring not checking Chest pain- no    Dyspnea- no Medications Compliance-  Taking coreg, hydralazine, and losartan, rarely takes lasix for ankle edema.   Edema- none recently Mildly hyponatremic in the past. No nausea or vomiting or confusion.  She reports no recurrence of her pressure ulcers. Has not had issues with this in the last 6 months.  Acquired complete AV block: Has had a pacemaker for several years. Note she is followed by cardiology and [facility] for this. No palpitations. Sees cardiology in one month.  Urine retention: Followed by urology. Has a catheter in. Has is replaced every 30 days. No recent urine infections.  History of ischemic colitis: No abdominal pain. No rectal bleeding. No recurrence of this issue.   PMH: nonsmoker.   ROS see history of present illness  Objective  Physical Exam Filed Vitals:   11/16/15 1100  BP: 124/72  Pulse: 92  Temp: 98.5 F (36.9 C)    BP Readings from Last 3 Encounters:  11/16/15 124/72  09/28/15 147/80  08/10/15 132/78   Wt Readings from Last 3 Encounters:  11/16/15 118 lb (53.524 kg)  09/28/15 112 lb 8.7 oz (51.05 kg)  08/10/15 117 lb 12.8 oz (53.434 kg)    Physical Exam  Constitutional: She is well-developed, well-nourished, and in no distress.  HENT:  Head: Normocephalic and atraumatic.  Right Ear: External ear normal.  Left Ear: External ear normal.  Cardiovascular: Normal rate, regular rhythm and normal heart sounds.   Pulmonary/Chest: Effort normal and breath sounds normal.  Abdominal: Soft. Bowel sounds are normal. She exhibits no distension. There is no tenderness.  Musculoskeletal: She exhibits no edema.  Neurological: She is alert.  Skin: Skin is warm and dry. She is not  diaphoretic.     Assessment/Plan: Please see individual problem list.  Acquired complete AV block Stable. Followed by cardiology. She'll keep her appointment with them next month.  Essential hypertension At goal. Continue current medications. We will check a BMP today as she has been mildly hyponatremic in the past.  Urinary retention Urinary catheter in place. Followed by urology. This is changed every 30 days.  Stage II pressure ulcer of left buttock No recurrence. Reports this is well-healed. Declined exam today.  Ischemic colitis [facility]) Patient has a history of this in the past. No recurrence of bleeding. No abdominal pain. Continue to monitor. Given return precautions.    Orders Placed This Encounter  Procedures  . Basic Metabolic Panel (BMET)    Tommi Rumps, MD [place]

## 2015-11-16 NOTE — Telephone Encounter (Signed)
Please advise on refill. Historical provider.  

## 2015-11-16 NOTE — Progress Notes (Signed)
Pre visit review using our clinic review tool, if applicable. No additional management support is needed unless otherwise documented below in the visit note. 

## 2015-11-16 NOTE — Assessment & Plan Note (Signed)
Patient has a history of this in the past. No recurrence of bleeding. No abdominal pain. Continue to monitor. Given return precautions.

## 2015-11-16 NOTE — Assessment & Plan Note (Signed)
No recurrence. Reports this is well-healed. Declined exam today.

## 2015-11-16 NOTE — Assessment & Plan Note (Signed)
Stable. Followed by cardiology. She'll keep her appointment with them next month.

## 2015-11-16 NOTE — Telephone Encounter (Signed)
Refill sent to pharmacy.   

## 2015-11-16 NOTE — Assessment & Plan Note (Signed)
At goal. Continue current medications. We will check a BMP today as she has been mildly hyponatremic in the past.

## 2015-11-16 NOTE — Assessment & Plan Note (Signed)
Urinary catheter in place. Followed by urology. This is changed every 30 days.

## 2015-11-16 NOTE — Patient Instructions (Signed)
Nice to see you. We'll check some lab work today and call with results. Please keep her next appointment with cardiologist. If you have recurrence of the pressure ulcers or you develop abdominal pain, rectal bleeding, chest pain, shortness of breath, fevers, nausea, vomiting, confusion, or any new or changing symptoms please seek medical attention.

## 2015-11-21 ENCOUNTER — Encounter: Payer: Self-pay | Admitting: Sports Medicine

## 2015-11-21 ENCOUNTER — Ambulatory Visit (INDEPENDENT_AMBULATORY_CARE_PROVIDER_SITE_OTHER): Payer: Medicare Other | Admitting: Sports Medicine

## 2015-11-21 DIAGNOSIS — M2040 Other hammer toe(s) (acquired), unspecified foot: Secondary | ICD-10-CM

## 2015-11-21 DIAGNOSIS — B351 Tinea unguium: Secondary | ICD-10-CM

## 2015-11-21 DIAGNOSIS — M79674 Pain in right toe(s): Secondary | ICD-10-CM | POA: Diagnosis not present

## 2015-11-21 DIAGNOSIS — M21619 Bunion of unspecified foot: Secondary | ICD-10-CM

## 2015-11-21 DIAGNOSIS — M79675 Pain in left toe(s): Secondary | ICD-10-CM

## 2015-11-21 NOTE — Progress Notes (Signed)
Patient ID: [name], female   DOB: August 25, 1922, 82 y.o.   MRN: [id number]  Subjective: [name] is a 82 y.o. female patient seen today in office with complaint of painful thickened and elongated toenails; unable to trim. Patient denies any changes with health since last visit. Patient has no other pedal complaints at this time.   Patient Active Problem List   Diagnosis Date Noted  . Viral upper respiratory infection 06/14/2015  . Status post left hip replacement 05/22/2015  . Anemia 05/01/2015  . Right knee injury 04/25/2015  . Low back pain 04/25/2015  . Ischemic colitis ([place]) 04/20/2015  . Bad odor of urine 03/17/2015  . Stage II pressure ulcer of left buttock 03/13/2015  . Artificial cardiac pacemaker 03/03/2015  . HLD (hyperlipidemia) 03/03/2015  . BP (high blood pressure) 03/03/2015  . Colitis, ischemic ([place]) 03/03/2015  . Candidal intertrigo, inguinal 03/01/2015  . Essential hypertension 03/01/2015  . Monocytosis 02/09/2015  . Thrombocytopenia ([place]) 02/09/2015  . B12 deficiency 02/09/2015  . Urinary retention 02/09/2015  . Pressure ulcer of left coccygeal region, stage 2 02/09/2015  . Bacteremia 01/12/2015  . UTI (lower urinary tract infection) 01/12/2015  . Acquired complete AV block ([place]) 06/22/2012  . Supraventricular tachycardia ([place]) 06/22/2012  . Complete atrioventricular block ([place]) 06/22/2012   Current Outpatient Prescriptions on File Prior to Visit  Medication Sig Dispense Refill  . aspirin EC 81 MG tablet Take 81 mg by mouth daily.    . carvedilol (COREG) 12.5 MG tablet Take 12.5 mg by mouth 2 (two) times daily.    . furosemide (LASIX) 20 MG tablet Take 10 mg by mouth daily as needed for edema.     . hydrALAZINE (APRESOLINE) 10 MG tablet TAKE 1 TABLET BY MOUTH TWICE DAILY 60 tablet 0  . HYDROcodone-acetaminophen (NORCO/VICODIN) 5-325 MG per tablet Take 1 tablet by mouth 4 (four) times daily as needed for moderate pain.    Marland Kitchen latanoprost (XALATAN) 0.005 %  ophthalmic solution Place 1 drop into both eyes at bedtime.    Marland Kitchen losartan (COZAAR) 100 MG tablet Take 50 mg by mouth 2 (two) times daily.    . Multiple Vitamin (MULTIVITAMIN WITH MINERALS) TABS tablet Take 1 tablet by mouth daily.    . Multiple Vitamins-Minerals (PRESERVISION AREDS 2) CAPS Take 2 capsules by mouth daily. Reported on 06/22/2015    . Omega-3 Fatty Acids (FISH OIL) 1000 MG CAPS Take 2,000 mg by mouth daily.     . predniSONE (DELTASONE) 10 MG tablet Please take 50 mg (5 tablets) by mouth today, then decrease by one tablet by mouth daily until gone 15 tablet 0  . ZINC OXIDE, TOPICAL, 10 % CREA Apply 1 application topically 2 (two) times daily. To affected area 113 g 3   No current facility-administered medications on file prior to visit.      Allergies  Allergen Reactions  . Atenolol Other (See Comments)    Reaction:  Complete Heart Block  . Penicillins Other (See Comments)    rash  . Dilaudid [Hydromorphone Hcl] Rash  . Hydromorphone Rash     Objective: Physical Exam  General: Well developed, nourished, no acute distress, awake, alert and oriented x 3. Walker assisted gait.   Vascular: Dorsalis pedis artery 1/4 bilateral, Posterior tibial artery 1/4 bilateral, skin temperature warm to warm proximal to distal bilateral lower extremities, + varicosities, scant pedal hair present bilateral.  Neurological: Gross sensation present via light touch bilateral.   Dermatological: Skin is warm, dry, and supple  bilateral, Nails 1-10 are tender, long, thick, and discolored with mild subungal debris, no webspace macerations present bilateral, no open lesions present bilateral, no callus/corns/hyperkeratotic tissue present bilateral. No signs of infection bilateral.  Musculoskeletal: Asymptomatic bunion and hammertoe boney deformities noted bilateral. Muscular strength within normal limits without pain on range of motion. No pain with calf compression bilateral.  Assessment and Plan:   Problem List Items Addressed This Visit    None    Visit Diagnoses    Dermatophytosis of nail    -  Primary    Pain in toes of both feet        Bunion        Hammer toe, unspecified laterality          -Examined patient.  -Discussed treatment options for painful mycotic nails. -Mechanically debrided and reduced mycotic nails with sterile nail nipper and dremel nail file without incident. -Recommend good supportive shoes for foot type. -Patient to return in 3 months for follow up evaluation or sooner if symptoms worsen.  Landis Martins, DPM

## 2015-11-23 DIAGNOSIS — H353130 Nonexudative age-related macular degeneration, bilateral, stage unspecified: Secondary | ICD-10-CM | POA: Diagnosis not present

## 2015-11-24 ENCOUNTER — Telehealth: Payer: Self-pay | Admitting: Family Medicine

## 2015-11-24 NOTE — Telephone Encounter (Signed)
FYI

## 2015-11-24 NOTE — Telephone Encounter (Signed)
Pt's daughter wanted Dr. Caryl Bis to know, pt has low sodium, family is going to try to keep up with her water and a salty snack in the afternoon. Then will follow up in 3 months with Dr. Caryl Bis. Family does not want to try to go to a specialist.

## 2015-11-27 ENCOUNTER — Ambulatory Visit: Payer: Medicare Other

## 2015-11-27 ENCOUNTER — Other Ambulatory Visit: Payer: Medicare Other

## 2015-11-27 NOTE — Telephone Encounter (Signed)
Noted  

## 2015-11-28 ENCOUNTER — Inpatient Hospital Stay: Payer: Medicare Other

## 2015-11-28 ENCOUNTER — Inpatient Hospital Stay: Payer: Medicare Other | Attending: Hematology and Oncology

## 2015-11-28 DIAGNOSIS — D696 Thrombocytopenia, unspecified: Secondary | ICD-10-CM | POA: Diagnosis not present

## 2015-11-28 DIAGNOSIS — E538 Deficiency of other specified B group vitamins: Secondary | ICD-10-CM | POA: Diagnosis not present

## 2015-11-28 DIAGNOSIS — D72821 Monocytosis (symptomatic): Secondary | ICD-10-CM | POA: Insufficient documentation

## 2015-11-28 DIAGNOSIS — R5383 Other fatigue: Secondary | ICD-10-CM | POA: Diagnosis not present

## 2015-11-28 DIAGNOSIS — Z79899 Other long term (current) drug therapy: Secondary | ICD-10-CM | POA: Diagnosis not present

## 2015-11-28 LAB — CBC WITH DIFFERENTIAL/PLATELET
Basophils Absolute: 0 10*3/uL (ref 0–0.1)
Basophils Relative: 1 %
Eosinophils Absolute: 0 10*3/uL (ref 0–0.7)
Eosinophils Relative: 1 %
HCT: 32 % — ABNORMAL LOW (ref 35.0–47.0)
Hemoglobin: 10.9 g/dL — ABNORMAL LOW (ref 12.0–16.0)
Lymphocytes Relative: 31 %
Lymphs Abs: 1.6 10*3/uL (ref 1.0–3.6)
MCH: 29.8 pg (ref 26.0–34.0)
MCHC: 33.9 g/dL (ref 32.0–36.0)
MCV: 87.8 fL (ref 80.0–100.0)
Monocytes Absolute: 1.5 10*3/uL — ABNORMAL HIGH (ref 0.2–0.9)
Monocytes Relative: 27 %
Neutro Abs: 2.2 10*3/uL (ref 1.4–6.5)
Neutrophils Relative %: 40 %
Platelets: 67 10*3/uL — ABNORMAL LOW (ref 150–440)
RBC: 3.65 MIL/uL — ABNORMAL LOW (ref 3.80–5.20)
RDW: 15 % — ABNORMAL HIGH (ref 11.5–14.5)
WBC: 5.3 10*3/uL (ref 3.6–11.0)

## 2015-11-28 MED ORDER — CYANOCOBALAMIN 1000 MCG/ML IJ SOLN
1000.0000 ug | Freq: Once | INTRAMUSCULAR | Status: AC
  Administered 2015-11-28: 1000 ug via INTRAMUSCULAR
  Filled 2015-11-28: qty 1

## 2015-11-30 ENCOUNTER — Ambulatory Visit (INDEPENDENT_AMBULATORY_CARE_PROVIDER_SITE_OTHER): Payer: Medicare Other

## 2015-11-30 DIAGNOSIS — R339 Retention of urine, unspecified: Secondary | ICD-10-CM

## 2015-11-30 NOTE — Progress Notes (Signed)
Cath Change/ Replacement  Patient is present today for a catheter change due to urinary retention.  44ml of water was removed from the balloon, a 16FR foley cath was removed with out difficulty.  Patient was cleaned and prepped in a sterile fashion with betadine and 2% lidocaine jelly was instilled into the urethra. A 16 FR foley cath was replaced into the bladder no complications were noted Urine return was noted 87ml and urine was yellow in color. The balloon was filled with 67ml of sterile water. A night bag was attached for drainage.   Preformed by: Toniann Fail, LPN   Follow up: Pt has not been seen in 3mo therefore next appt should be with [facility]. Pt daughter was going to make appt at check out.

## 2015-12-10 ENCOUNTER — Other Ambulatory Visit: Payer: Self-pay | Admitting: Family Medicine

## 2015-12-28 ENCOUNTER — Inpatient Hospital Stay: Payer: Medicare Other | Attending: Hematology and Oncology

## 2015-12-28 DIAGNOSIS — D72821 Monocytosis (symptomatic): Secondary | ICD-10-CM | POA: Diagnosis not present

## 2015-12-28 DIAGNOSIS — R5383 Other fatigue: Secondary | ICD-10-CM | POA: Insufficient documentation

## 2015-12-28 DIAGNOSIS — D696 Thrombocytopenia, unspecified: Secondary | ICD-10-CM | POA: Insufficient documentation

## 2015-12-28 DIAGNOSIS — Z79899 Other long term (current) drug therapy: Secondary | ICD-10-CM | POA: Diagnosis not present

## 2015-12-28 DIAGNOSIS — E538 Deficiency of other specified B group vitamins: Secondary | ICD-10-CM | POA: Diagnosis not present

## 2015-12-28 MED ORDER — CYANOCOBALAMIN 1000 MCG/ML IJ SOLN
1000.0000 ug | Freq: Once | INTRAMUSCULAR | Status: AC
  Administered 2015-12-28: 1000 ug via INTRAMUSCULAR
  Filled 2015-12-28: qty 1

## 2016-01-02 ENCOUNTER — Encounter: Payer: Self-pay | Admitting: Urology

## 2016-01-02 ENCOUNTER — Ambulatory Visit (INDEPENDENT_AMBULATORY_CARE_PROVIDER_SITE_OTHER): Payer: Medicare Other | Admitting: Urology

## 2016-01-02 VITALS — BP 175/72 | HR 74 | Ht 64.0 in | Wt 116.9 lb

## 2016-01-02 DIAGNOSIS — R339 Retention of urine, unspecified: Secondary | ICD-10-CM | POA: Diagnosis not present

## 2016-01-02 DIAGNOSIS — Z960 Presence of urogenital implants: Secondary | ICD-10-CM

## 2016-01-02 DIAGNOSIS — Z9289 Personal history of other medical treatment: Secondary | ICD-10-CM | POA: Diagnosis not present

## 2016-01-02 DIAGNOSIS — Z978 Presence of other specified devices: Secondary | ICD-10-CM

## 2016-01-02 NOTE — Progress Notes (Signed)
01/02/2016 2:40 PM   [name] [birth year] [id number]  Referring provider: Leone Haven, MD [address] [place], [place] [address]  Chief Complaint  Patient presents with  . Urinary Retention    Cath Change    HPI: Patient is a 82 year old Caucasian female with a history of urinary retention which is managed with an indwelling Foley who presents today for a monthly catheter exchange.  Background history In 02/2015, Patient was admitted with fever and UTI. She underwent CT scan which showed a distended bladder and bilateral moderate hydronephrosis. Prior to admission she was having increased urinary frequency, urgency, nocturia. She does not recall having issues with retention in the past. She also has new onset abdominal distention with mild constant abdominal pain. Currently her fever is 103F, WBC count is 19.  A foley catheter was placed.  She continued the foley.  Today, she is doing well.  Her daughter states that she has been great since the Foley.  She has not had any UTI's.  She is tolerating the catheter.  She has good UOP.  She has not had gross hematuria.  She has not had fevers, chills, nausea or vomiting.      PMH: Past Medical History:  Diagnosis Date  . Acquired complete AV block ([place]) 06/22/2012   Overview:  While on atenolol.  Again on diltiazem. Pacemaker placed 06/2012.   Marland Kitchen Anemia   . Arrhythmia   . Arthritis   . Atrial fibrillation ([place])   . BP (high blood pressure) 03/03/2015  . Complete atrioventricular block ([place]) 06/22/2012   Overview:  While on atenolol.  Again on diltiazem. Pacemaker placed 06/2012.   . Essential hypertension 03/01/2015  . HTN (hypertension)   . Hyperlipidemia   . Ischemic colitis ([place])   . Mobitz type 2 second degree heart block   . Pacemaker   . Stroke ([place])   . Supraventricular tachycardia ([place]) 06/22/2012   Overview:  Short bursts at rates up to 150bpm.   . Syncope   . Thrombocytopenia ([place])   . UTI (lower  urinary tract infection) 01/12/2015  . Viral upper respiratory infection 06/14/2015    Surgical History: Past Surgical History:  Procedure Laterality Date  . CHOLECYSTECTOMY    . Left Hip Replacement     . PACEMAKER PLACEMENT      Home Medications:    Medication List       Accurate as of 01/02/16  2:40 PM. Always use your most recent med list.          acetaminophen 325 MG tablet Commonly known as:  TYLENOL Take by mouth.   aspirin EC 81 MG tablet Take 81 mg by mouth daily.   carvedilol 12.5 MG tablet Commonly known as:  COREG Take 12.5 mg by mouth 2 (two) times daily.   Fish Oil 1000 MG Caps Take 2,000 mg by mouth daily.   furosemide 20 MG tablet Commonly known as:  LASIX Take 10 mg by mouth daily as needed for edema.   hydrALAZINE 10 MG tablet Commonly known as:  APRESOLINE TAKE 1 TABLET BY MOUTH TWICE DAILY   HYDROcodone-acetaminophen 5-325 MG tablet Commonly known as:  NORCO/VICODIN Take 1 tablet by mouth 4 (four) times daily as needed for moderate pain.   latanoprost 0.005 % ophthalmic solution Commonly known as:  XALATAN Place 1 drop into both eyes at bedtime.   loperamide 2 MG capsule Commonly known as:  IMODIUM Take by mouth.   losartan 100 MG  tablet Commonly known as:  COZAAR Take 50 mg by mouth 2 (two) times daily.   multivitamin with minerals Tabs tablet Take 1 tablet by mouth daily.   predniSONE 10 MG tablet Commonly known as:  DELTASONE Please take 50 mg (5 tablets) by mouth today, then decrease by one tablet by mouth daily until gone   PRESERVISION AREDS 2 Caps Take 2 capsules by mouth daily. Reported on 06/22/2015   ZINC OXIDE (TOPICAL) 10 % Crea Apply 1 application topically 2 (two) times daily. To affected area       Allergies:  Allergies  Allergen Reactions  . Atenolol Other (See Comments)    Reaction:  Complete Heart Block  . Penicillins Other (See Comments)    rash  . Dilaudid [Hydromorphone Hcl] Rash  . Hydromorphone  Rash    Family History: Family History  Problem Relation Age of Onset  . Stroke Mother   . Alcoholism Father   . Hypertension Sister   . Prostate cancer Neg Hx   . Bladder Cancer Neg Hx   . Kidney cancer Neg Hx     Social History:  reports that she has never smoked. She has never used smokeless tobacco. She reports that she drinks about 0.6 oz of alcohol per week . She reports that she does not use drugs.  ROS: UROLOGY Frequent Urination?: No Hard to postpone urination?: No Burning/pain with urination?: No Get up at night to urinate?: No Leakage of urine?: No Urine stream starts and stops?: No Trouble starting stream?: No Do you have to strain to urinate?: No Blood in urine?: No Urinary tract infection?: No Sexually transmitted disease?: No Injury to kidneys or bladder?: No Painful intercourse?: No Weak stream?: No Currently pregnant?: No Vaginal bleeding?: No Last menstrual period?: nn  Gastrointestinal Nausea?: No Vomiting?: No Indigestion/heartburn?: No Diarrhea?: No Constipation?: No  Constitutional Fever: No Night sweats?: No Weight loss?: No Fatigue?: No  Skin Skin rash/lesions?: No Itching?: No  Eyes Blurred vision?: No Double vision?: No  Ears/Nose/Throat Sore throat?: No Sinus problems?: No  Hematologic/Lymphatic Swollen glands?: No Easy bruising?: No  Cardiovascular Leg swelling?: No Chest pain?: No  Respiratory Cough?: No Shortness of breath?: No  Endocrine Excessive thirst?: No  Musculoskeletal Back pain?: No Joint pain?: No  Neurological Headaches?: No Dizziness?: No  Psychologic Depression?: No Anxiety?: No  Physical Exam: BP (!) 175/72   Pulse 74   Ht 5\' 4"  (1.626 m)   Wt 116 lb 14.4 oz (53 kg)   BMI 20.07 kg/m   Constitutional: Well nourished. Alert and oriented, No acute distress. HEENT: [place] AT, moist mucus membranes. Trachea midline, no masses. Cardiovascular: No clubbing, cyanosis, or  edema. Respiratory: Normal respiratory effort, no increased work of breathing. GI: Abdomen is soft, non tender, non distended, no abdominal masses. Liver and spleen not palpable.  No hernias appreciated.  Stool sample for occult testing is not indicated.   GU: No CVA tenderness.  No bladder fullness or masses.   Skin: No rashes, bruises or suspicious lesions. Lymph: No cervical or inguinal adenopathy. Neurologic: Grossly intact, no focal deficits, moving all 4 extremities. Psychiatric: Normal mood and affect.  Laboratory Data: Lab Results  Component Value Date   WBC 5.3 11/28/2015   HGB 10.9 (L) 11/28/2015   HCT 32.0 (L) 11/28/2015   MCV 87.8 11/28/2015   PLT 67 (L) 11/28/2015    Lab Results  Component Value Date   CREATININE 1.03 11/16/2015       Component Value Date/Time  CHOL 106 01/14/2013 0520   HDL 43 01/14/2013 0520   VLDL 7 01/14/2013 0520   LDLCALC 56 01/14/2013 0520    Lab Results  Component Value Date   AST 14 (L) 07/03/2015   Lab Results  Component Value Date   ALT 11 (L) 07/03/2015     Assessment & Plan:    1. Urinary retention  -Continue with indwelling foley  2. Foley exchange:     -Schedule cystoscopy  -I have explained to the patient that they will  be scheduled for a cystoscopy in our office to evaluate their bladder.  The cystoscopy consists of passing a tube with a lens up through their urethra and into their urinary bladder.   We will inject the urethra with a lidocaine gel prior to introducing the cystoscope to help with any discomfort during the procedure.   After the procedure, they might experience blood in the urine and discomfort with urination.  This will abate after the first few voids.  I have  encouraged the patient to increase water intake  during this time.  Patient denies any allergies to lidocaine.    Return for cystoscopy.  These notes generated with voice recognition software. I apologize for typographical errors.  Zara Council, [facility] [address], [place], [place] [address] [phone]

## 2016-01-09 DIAGNOSIS — I480 Paroxysmal atrial fibrillation: Secondary | ICD-10-CM | POA: Diagnosis not present

## 2016-01-09 DIAGNOSIS — E7800 Pure hypercholesterolemia, unspecified: Secondary | ICD-10-CM | POA: Diagnosis not present

## 2016-01-09 DIAGNOSIS — I5022 Chronic systolic (congestive) heart failure: Secondary | ICD-10-CM | POA: Diagnosis not present

## 2016-01-09 DIAGNOSIS — I471 Supraventricular tachycardia: Secondary | ICD-10-CM | POA: Diagnosis not present

## 2016-01-09 DIAGNOSIS — I10 Essential (primary) hypertension: Secondary | ICD-10-CM | POA: Diagnosis not present

## 2016-01-09 DIAGNOSIS — I442 Atrioventricular block, complete: Secondary | ICD-10-CM | POA: Diagnosis not present

## 2016-01-09 DIAGNOSIS — Z950 Presence of cardiac pacemaker: Secondary | ICD-10-CM | POA: Diagnosis not present

## 2016-01-14 IMAGING — CT CT RENAL STONE PROTOCOL
1 of 2 series · 14 of 32 positions shown, 18 images · non-contrast
Comparison: 01/13/2013

CLINICAL DATA: Nocturia, urinary frequency and dysuria. Hip and
back pain. Fevers. Recent urinary tract infection.

EXAM:
CT ABDOMEN AND PELVIS WITHOUT CONTRAST
TECHNIQUE: Multidetector CT imaging of the abdomen and pelvis was performed
following the standard protocol without IV contrast.

[Series 2: stone standard full · axial · 0.75mm/px · z∈[+394,+780]mm · 14 of 88 slices shown, 18 images]
[im 7/88  soft-tissue]
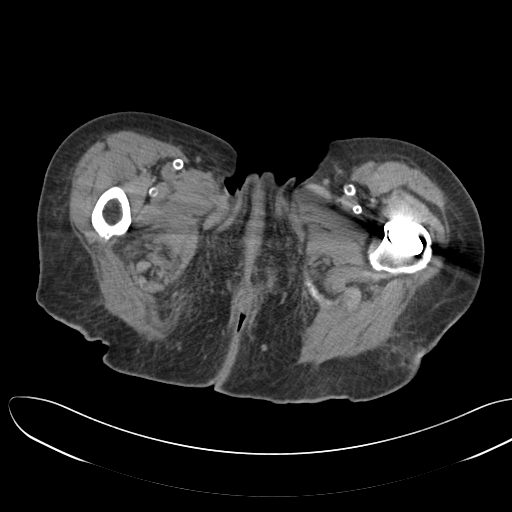
[im 7/88  bone]
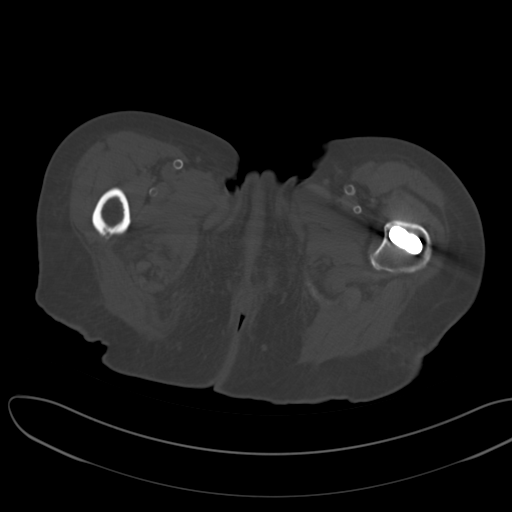
[im 14/88  soft-tissue]
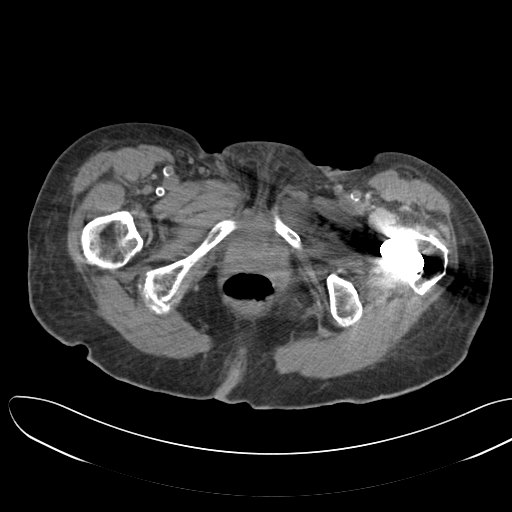
[im 21/88  soft-tissue]
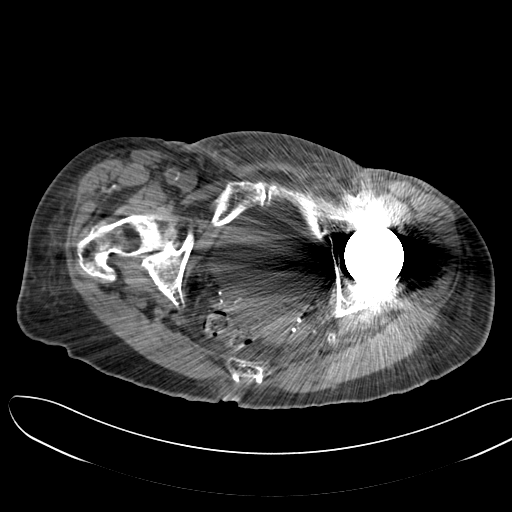
[im 28/88  soft-tissue]
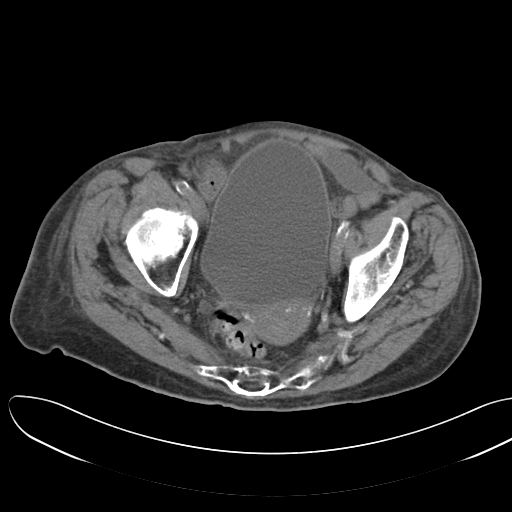
[im 35/88  soft-tissue]
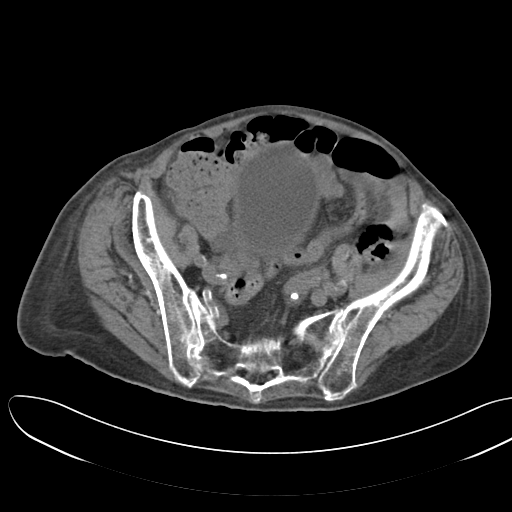
[im 42/88  soft-tissue]
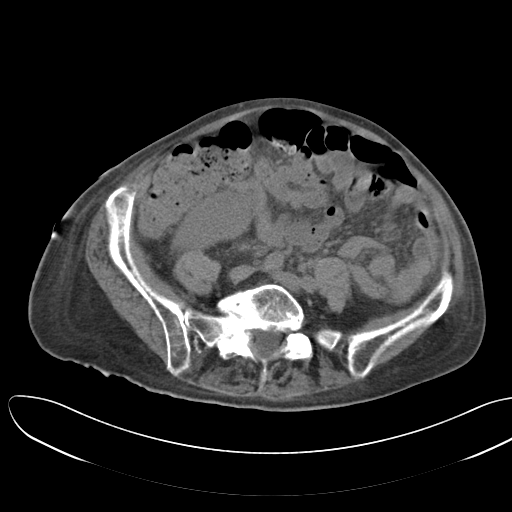
[im 49/88  soft-tissue]
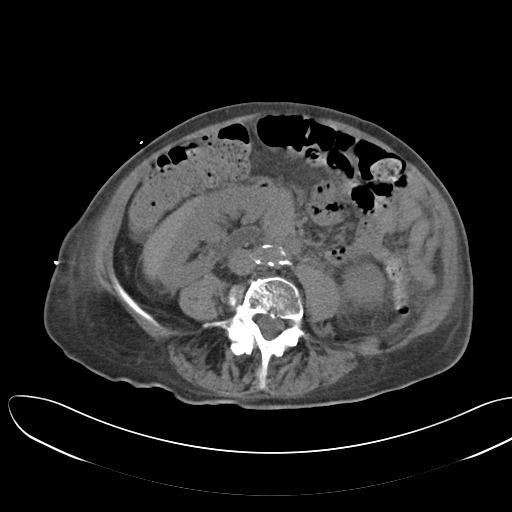
[im 56/88  soft-tissue]
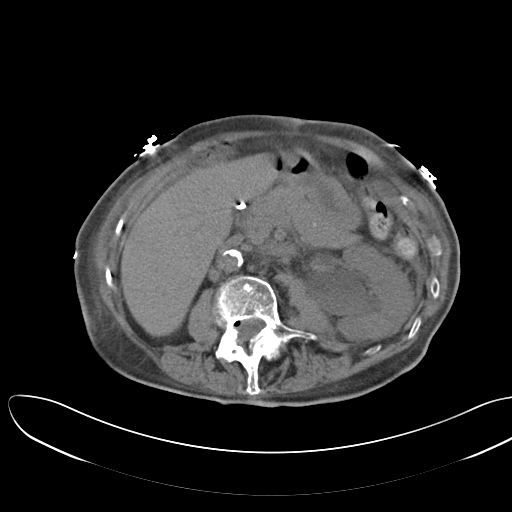
[im 63/88  soft-tissue]
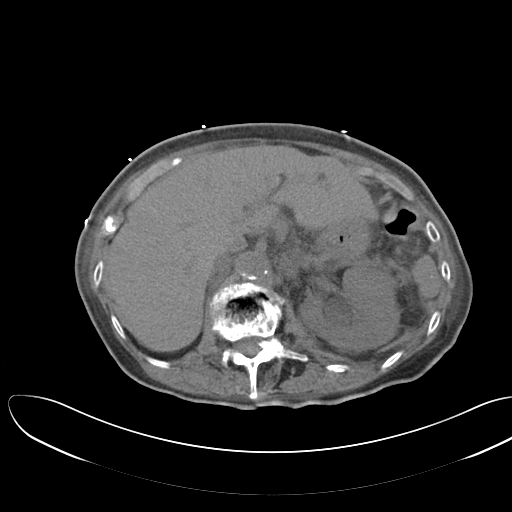
[im 63/88  bone]
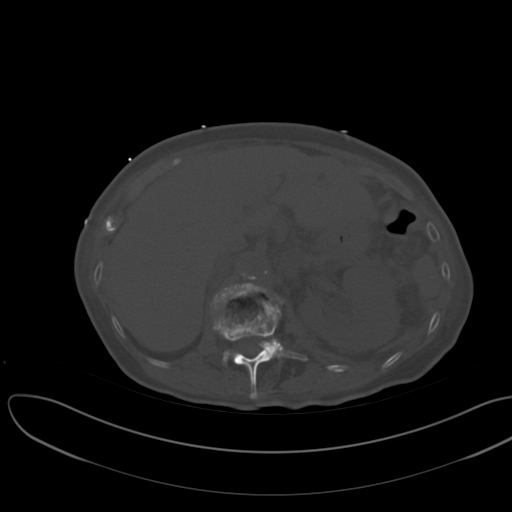
[im 70/88  soft-tissue]
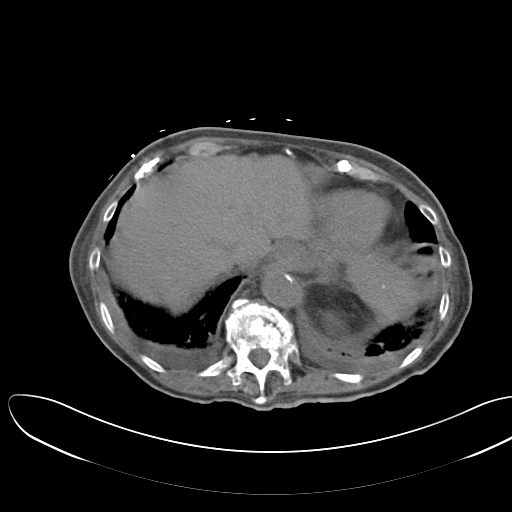
[im 74/88  lung]
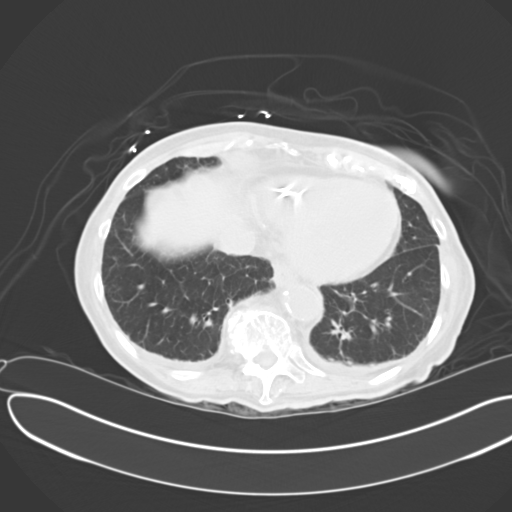
[im 77/88  soft-tissue]
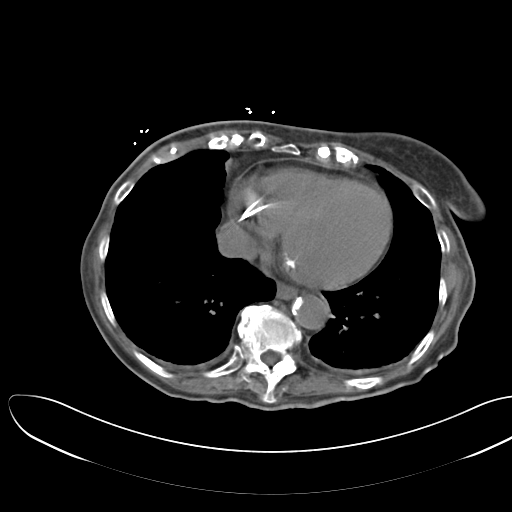
[im 77/88  lung]
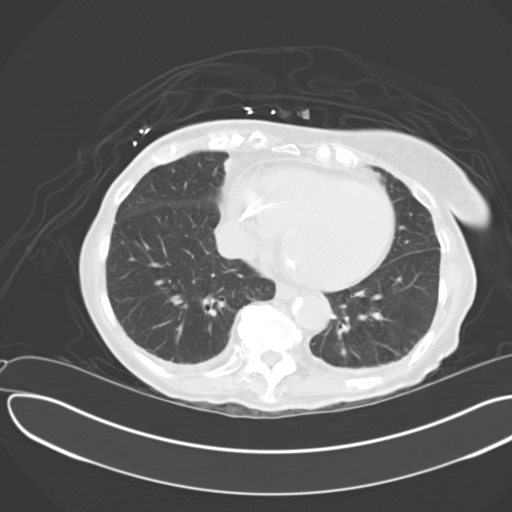
[im 81/88  lung]
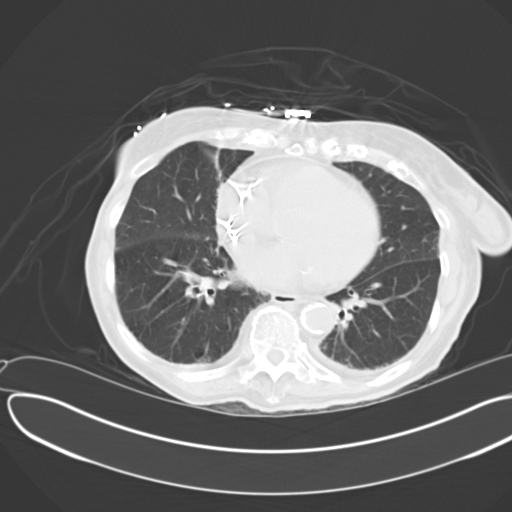
[im 84/88  soft-tissue]
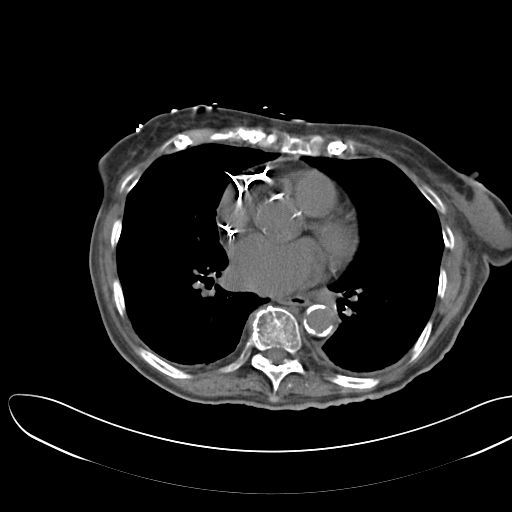
[im 84/88  lung]
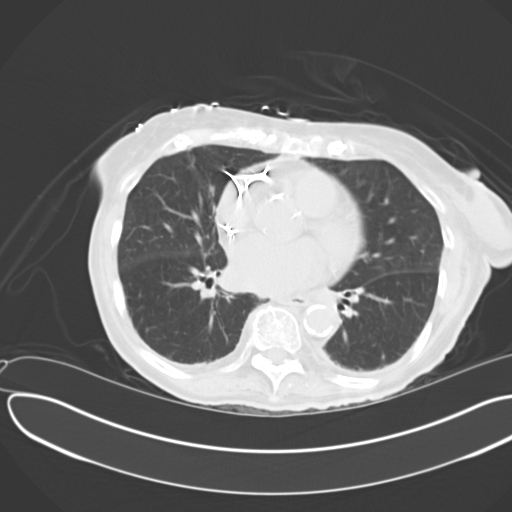

[14 of 32 positions shown; findings below may reference images not displayed]

FINDINGS: Lower chest: Small bilateral pleural effusions noted. Calcified
granuloma is identified within the right lower lobe.

Hepatobiliary: Low attenuation foci within the liver are again noted
and similar tooth 4258 examination and likely represent benign
cysts. Previous cholecystectomy. Chronic increase caliber of the
common bile duct is identified measuring up to 7 mm.

Pancreas: The pancreas is unremarkable.

Spleen: Multiple splenic granulomas identified.

Adrenals/Urinary Tract: Chronic enlargement of the adrenal glands
appear similar to previous study. Low lying and mi rotated right
kidney is noted. There is asymmetric left-sided hydronephrosis,
nephro megaly and perinephric fat stranding. No obstructing stone
identified. There is moderate distension of the urinary bladder.

Stomach/Bowel: The stomach appears normal. The small bowel loops
have a normal caliber. No obstruction. Unremarkable appearance of
the colon.

Vascular/Lymphatic: There is aortic atherosclerosis and tortuosity.
No aneurysm. No upper abdominal adenopathy noted. No pelvic or
inguinal adenopathy noted.

Reproductive: The uterus and the adnexal structures are within
normal limits.

Other: No free fluid or fluid collections identified.

Musculoskeletal: Previous left hip arthroplasty. Scoliosis and
degenerative disc disease noted within the lumbar spine. Compression
fracture involving the T12 vertebra is identified and is new from
previous examination.
IMPRESSION: 1. Examination is positive for left-sided hydronephrosis,
nephromegaly and perinephric fat stranding. Findings may be a
manifestation of obstructive uropathy and/or pyelonephritis. No
obstructing stone identified. No obstructing stone identified.
2. Aortic atherosclerosis
3. Prior granulomatous disease
4. Osteopenia and lumbar spondylosis.
5. New T12 compression fracture is age indeterminate.
6. New bilateral pleural effusions.

## 2016-01-29 ENCOUNTER — Ambulatory Visit: Payer: Medicare Other

## 2016-01-29 ENCOUNTER — Other Ambulatory Visit: Payer: Medicare Other

## 2016-01-29 ENCOUNTER — Ambulatory Visit: Payer: Medicare Other | Admitting: Hematology and Oncology

## 2016-01-30 ENCOUNTER — Encounter: Payer: Self-pay | Admitting: Hematology and Oncology

## 2016-01-30 ENCOUNTER — Encounter (INDEPENDENT_AMBULATORY_CARE_PROVIDER_SITE_OTHER): Payer: Self-pay

## 2016-01-30 ENCOUNTER — Other Ambulatory Visit: Payer: Self-pay | Admitting: *Deleted

## 2016-01-30 ENCOUNTER — Inpatient Hospital Stay: Payer: Medicare Other

## 2016-01-30 ENCOUNTER — Other Ambulatory Visit: Payer: Self-pay | Admitting: Hematology and Oncology

## 2016-01-30 ENCOUNTER — Inpatient Hospital Stay: Payer: Medicare Other | Attending: Hematology and Oncology | Admitting: Hematology and Oncology

## 2016-01-30 VITALS — BP 160/77 | HR 73 | Temp 97.3°F | Resp 18 | Wt 125.0 lb

## 2016-01-30 DIAGNOSIS — N390 Urinary tract infection, site not specified: Secondary | ICD-10-CM | POA: Diagnosis not present

## 2016-01-30 DIAGNOSIS — Z8673 Personal history of transient ischemic attack (TIA), and cerebral infarction without residual deficits: Secondary | ICD-10-CM | POA: Insufficient documentation

## 2016-01-30 DIAGNOSIS — I442 Atrioventricular block, complete: Secondary | ICD-10-CM | POA: Insufficient documentation

## 2016-01-30 DIAGNOSIS — Z7982 Long term (current) use of aspirin: Secondary | ICD-10-CM | POA: Diagnosis not present

## 2016-01-30 DIAGNOSIS — D696 Thrombocytopenia, unspecified: Secondary | ICD-10-CM

## 2016-01-30 DIAGNOSIS — Z96642 Presence of left artificial hip joint: Secondary | ICD-10-CM | POA: Insufficient documentation

## 2016-01-30 DIAGNOSIS — Z950 Presence of cardiac pacemaker: Secondary | ICD-10-CM | POA: Diagnosis not present

## 2016-01-30 DIAGNOSIS — D72821 Monocytosis (symptomatic): Secondary | ICD-10-CM

## 2016-01-30 DIAGNOSIS — D6959 Other secondary thrombocytopenia: Secondary | ICD-10-CM | POA: Diagnosis not present

## 2016-01-30 DIAGNOSIS — C9310 Chronic myelomonocytic leukemia not having achieved remission: Secondary | ICD-10-CM | POA: Insufficient documentation

## 2016-01-30 DIAGNOSIS — E538 Deficiency of other specified B group vitamins: Secondary | ICD-10-CM | POA: Diagnosis not present

## 2016-01-30 DIAGNOSIS — Z79899 Other long term (current) drug therapy: Secondary | ICD-10-CM

## 2016-01-30 DIAGNOSIS — M1990 Unspecified osteoarthritis, unspecified site: Secondary | ICD-10-CM | POA: Insufficient documentation

## 2016-01-30 DIAGNOSIS — I10 Essential (primary) hypertension: Secondary | ICD-10-CM | POA: Diagnosis not present

## 2016-01-30 DIAGNOSIS — E785 Hyperlipidemia, unspecified: Secondary | ICD-10-CM | POA: Diagnosis not present

## 2016-01-30 DIAGNOSIS — I4891 Unspecified atrial fibrillation: Secondary | ICD-10-CM | POA: Insufficient documentation

## 2016-01-30 DIAGNOSIS — I471 Supraventricular tachycardia: Secondary | ICD-10-CM | POA: Insufficient documentation

## 2016-01-30 DIAGNOSIS — D6489 Other specified anemias: Secondary | ICD-10-CM

## 2016-01-30 LAB — CBC WITH DIFFERENTIAL/PLATELET
Basophils Absolute: 0 10*3/uL (ref 0–0.1)
Basophils Relative: 0 %
Eosinophils Absolute: 0.1 10*3/uL (ref 0–0.7)
Eosinophils Relative: 1 %
HCT: 34.2 % — ABNORMAL LOW (ref 35.0–47.0)
Hemoglobin: 11.5 g/dL — ABNORMAL LOW (ref 12.0–16.0)
Lymphocytes Relative: 29 %
Lymphs Abs: 1.6 10*3/uL (ref 1.0–3.6)
MCH: 29.3 pg (ref 26.0–34.0)
MCHC: 33.6 g/dL (ref 32.0–36.0)
MCV: 87 fL (ref 80.0–100.0)
Monocytes Absolute: 1.8 10*3/uL — ABNORMAL HIGH (ref 0.2–0.9)
Monocytes Relative: 32 %
Neutro Abs: 2.1 10*3/uL (ref 1.4–6.5)
Neutrophils Relative %: 38 %
Platelets: 80 10*3/uL — ABNORMAL LOW (ref 150–440)
RBC: 3.93 MIL/uL (ref 3.80–5.20)
RDW: 14.9 % — ABNORMAL HIGH (ref 11.5–14.5)
WBC: 5.6 10*3/uL (ref 3.6–11.0)

## 2016-01-30 MED ORDER — CYANOCOBALAMIN 1000 MCG/ML IJ SOLN
1000.0000 ug | Freq: Once | INTRAMUSCULAR | Status: AC
  Administered 2016-01-30: 1000 ug via INTRAMUSCULAR
  Filled 2016-01-30: qty 1

## 2016-01-30 NOTE — Progress Notes (Signed)
[facility] day:  01/30/2016  Chief Complaint: [name] is a 82 y.o. 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  HPI: The patient  was last seen in the medical oncology clinic on 09/28/2015.  At that time, she was fatigued, but felt good.  Platelet count was 73,000 (stable to improved).  She received her monthly B12.  Decision was made for ongoing observation.  She has continued her monthly B12 (last 12/28/2015).  CBC on 11/28/2015 revealed a hematocrit of 32.0, hemoglobin 10.9, MCV 87.8, platelets 67,000, WBC 5300 with an ANC of 2200.  Symptomatically, she is doing well.  She denies any infections, bruising or bleeding.   Past Medical History:  Diagnosis Date  . Acquired complete AV block ([place]) 06/22/2012   Overview:  While on atenolol.  Again on diltiazem. Pacemaker placed 06/2012.   Marland Kitchen Anemia   . Arrhythmia   . Arthritis   . Atrial fibrillation ([place])   . BP (high blood pressure) 03/03/2015  . Complete atrioventricular block ([place]) 06/22/2012   Overview:  While on atenolol.  Again on diltiazem. Pacemaker placed 06/2012.   . Essential hypertension 03/01/2015  . HTN (hypertension)   . Hyperlipidemia   . Ischemic colitis ([place])   . Mobitz type 2 second degree heart block   . Pacemaker   . Stroke ([place])   . Supraventricular tachycardia ([place]) 06/22/2012   Overview:  Short bursts at rates up to 150bpm.   . Syncope   . Thrombocytopenia ([place])   . UTI (lower urinary tract infection) 01/12/2015  . Viral upper respiratory infection 06/14/2015    Past Surgical History:  Procedure Laterality Date  . CHOLECYSTECTOMY    . Left Hip Replacement     . PACEMAKER PLACEMENT      Family History  Problem Relation Age of Onset  . Stroke Mother   . Alcoholism Father   . Hypertension Sister   . Prostate cancer Neg Hx   . Bladder Cancer Neg Hx   . Kidney cancer  Neg Hx     Social History:  reports that she has never smoked. She has never used smokeless tobacco. She reports that she drinks about 0.6 oz of alcohol per week . She reports that she does not use drugs.  The patient is accompanied by her her daughter today.  Allergies:  Allergies  Allergen Reactions  . Atenolol Other (See Comments)    Reaction:  Complete Heart Block  . Penicillins Other (See Comments)    rash  . Dilaudid [Hydromorphone Hcl] Rash  . Hydromorphone Rash    Current Medications: Current Outpatient Prescriptions  Medication Sig Dispense Refill  . acetaminophen (TYLENOL) 325 MG tablet Take by mouth.    Marland Kitchen aspirin EC 81 MG tablet Take 81 mg by mouth daily.    . carvedilol (COREG) 12.5 MG tablet Take 12.5 mg by mouth 2 (two) times daily.    . furosemide (LASIX) 20 MG tablet Take 10 mg by mouth daily as needed for edema.     . hydrALAZINE (APRESOLINE) 10 MG tablet TAKE 1 TABLET BY MOUTH TWICE DAILY 60 tablet 2  . HYDROcodone-acetaminophen (NORCO/VICODIN) 5-325 MG per tablet Take 1 tablet by mouth 4 (four) times daily as needed for moderate pain.    Marland Kitchen latanoprost (XALATAN) 0.005 % ophthalmic solution Place 1 drop into both eyes at bedtime.    Marland Kitchen loperamide (IMODIUM) 2  MG capsule Take by mouth.    . losartan (COZAAR) 100 MG tablet Take 50 mg by mouth 2 (two) times daily.    . Multiple Vitamin (MULTIVITAMIN WITH MINERALS) TABS tablet Take 1 tablet by mouth daily.    . Multiple Vitamins-Minerals (PRESERVISION AREDS 2) CAPS Take 2 capsules by mouth daily. Reported on 06/22/2015    . Omega-3 Fatty Acids (FISH OIL) 1000 MG CAPS Take 2,000 mg by mouth daily.     . predniSONE (DELTASONE) 10 MG tablet Please take 50 mg (5 tablets) by mouth today, then decrease by one tablet by mouth daily until gone 15 tablet 0  . ZINC OXIDE, TOPICAL, 10 % CREA Apply 1 application topically 2 (two) times daily. To affected area 113 g 3   No current facility-administered medications for this visit.      Review of Systems:  GENERAL: Feels "fine".  No fevers or sweats.  Weight up 13 pounds. PERFORMANCE STATUS (ECOG):  2 HEENT:  No visual changes, runny nose, sore throat, mouth sores or tenderness. Lungs:  No shortness of breath or cough.  No hemoptysis. Cardiac:  No chest pain, palpitations, orthopnea, or PND.  Pacemaker. GI:  Eating well.  Drinks [employer].  No nausea, vomiting, diarrhea, constipation, melena or hematochezia. GU:  Urinary retention with Foley catheter.  No urgency, frequency, dysuria, or hematuria. Musculoskeletal:  Back and hip pain, depends on how sleep in bed.  No muscle tenderness. Extremities:  No pain or swelling. Skin:  No rashes or skin changes. Neuro:  No headache, numbness or weakness, balance or coordination issues. Endocrine:  No diabetes, thyroid issues, hot flashes or night sweats. Psych:  No mood changes, depression or anxiety. Pain:  No focal pain. Review of systems:  All other systems reviewed and found to be negative.  Physical Exam: Blood pressure (!) 160/77, pulse 73, temperature 97.3 F (36.3 C), temperature source Tympanic, resp. rate 18, weight 125 lb (56.7 kg). GENERAL:  Thin elderly woman sitting comfortably in a wheelchair in the exam room in no acute distress. MENTAL STATUS:  Alert and oriented to person, place and time. HEAD:  Short blonde hair.  Normocephalic, atraumatic, face symmetric, no Cushingoid features. EYES:  Glasses.  Brown eyes.  Pupils equal round and reactive to light and accomodation.  No conjunctivitis or scleral icterus. ENT:  Oropharynx clear without lesion. Dentures. Tongue normal. Mucous membranes moist.  RESPIRATORY:  Clear to auscultation without rales, wheezes or rhonchi. CARDIOVASCULAR:  Regular rate and rhythm without murmur, rub or gallop. ABDOMEN:  Soft, non-tender, with active bowel sounds, and no hepatosplenomegaly.  No masses. GENITOURINARY:  Foley catheter in place. SKIN:  Scattered small  bruises.  No rashes, ulcers or lesions. EXTREMITIES: No edema, no skin discoloration or tenderness.  No palpable cords. LYMPH NODES: No palpable cervical, supraclavicular, axillary or inguinal adenopathy  NEUROLOGICAL: Unremarkable. PSYCH:  Appropriate.  Appointment on 01/30/2016  Component Date Value Ref Range Status  . WBC 01/30/2016 5.6  3.6 - 11.0 K/uL Final  . RBC 01/30/2016 3.93  3.80 - 5.20 MIL/uL Final  . Hemoglobin 01/30/2016 11.5* 12.0 - 16.0 g/dL Final  . HCT 01/30/2016 34.2* 35.0 - 47.0 % Final  . MCV 01/30/2016 87.0  80.0 - 100.0 fL Final  . MCH 01/30/2016 29.3  26.0 - 34.0 pg Final  . MCHC 01/30/2016 33.6  32.0 - 36.0 g/dL Final  . RDW 01/30/2016 14.9* 11.5 - 14.5 % Final  . Platelets 01/30/2016 80* 150 - 440 K/uL Final  .  Neutrophils Relative % 01/30/2016 38%  % Final  . Neutro Abs 01/30/2016 2.1  1.4 - 6.5 K/uL Final  . Lymphocytes Relative 01/30/2016 29%  % Final  . Lymphs Abs 01/30/2016 1.6  1.0 - 3.6 K/uL Final  . Monocytes Relative 01/30/2016 32%  % Final  . Monocytes Absolute 01/30/2016 1.8* 0.2 - 0.9 K/uL Final  . Eosinophils Relative 01/30/2016 1%  % Final  . Eosinophils Absolute 01/30/2016 0.1  0 - 0.7 K/uL Final  . Basophils Relative 01/30/2016 0%  % Final  . Basophils Absolute 01/30/2016 0.0  0 - 0.1 K/uL Final    Assessment:  [name] is a 82 y.o. 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  She has a history of recurrent E coli UTI and associated bacteremia. She has a history of thrombocytopenia secondary to consumption associated with infection. She has poor marrow reserve secondary to her age.She has been treated with Septra, known myelosuppressant agent.  Work-up on 02/03/2015 revealed a low normal B12 (284) with an elevated MMA (390) consistent with a B12 deficiency.  Folate was normal (20.2) on 02/03/2015.  SPEP on 02/03/2015 revealed no monoclonal protein.  Free light chains were negative.  ANA was negative.  Coagulation studies were normal.  Creatinine was 0.87.  She has B12 deficiency.  She began B12 supplimentation on 02/17/2015.  She receives B12 monthly (last 12/28/2015).  Peripheral smear on 02/02/2015 revealed marked thrombocytopenia, scattered teardrop cells, and rare schistocytes. BCR-ABL on 02/05/2015 was negative, ruling out CML.  Flow cytometry on 02/05/2015 revealed absolute monocytosis with phenotypic aberrancies.  There was dim partial aberrant upregulation of CD56 and mild down-regulation of CD11b, CD11c and HLA-DR. Changes can be associated with reactive monocytosis and  myelodysplastic/myeloproliferative disorders.    Symptomatically, she is doing well.  Her performance status remains modest.  Platelet count is 80,000 (improved).  Plan: 1.  Labs today:  CBC with diff. 2.  B12 today and monthly. 3.  Review indications for intervention.  Continue to observe. 4.  RTC in 2 month for CBC with diff. 5.  RTC in 4 months for MD assess, labs (CBC with diff), and B12.   Lequita Asal, MD  01/30/2016, 12:10 PM

## 2016-01-30 NOTE — Progress Notes (Signed)
Patient is here for follow up,  

## 2016-02-01 ENCOUNTER — Ambulatory Visit (INDEPENDENT_AMBULATORY_CARE_PROVIDER_SITE_OTHER): Payer: Medicare Other | Admitting: Urology

## 2016-02-01 VITALS — BP 164/67 | HR 85 | Ht 62.0 in | Wt 116.0 lb

## 2016-02-01 DIAGNOSIS — R339 Retention of urine, unspecified: Secondary | ICD-10-CM | POA: Diagnosis not present

## 2016-02-01 DIAGNOSIS — N390 Urinary tract infection, site not specified: Secondary | ICD-10-CM | POA: Diagnosis not present

## 2016-02-01 MED ORDER — LIDOCAINE HCL 2 % EX GEL
1.0000 "application " | Freq: Once | CUTANEOUS | Status: AC
  Administered 2016-02-01: 1 via URETHRAL

## 2016-02-01 MED ORDER — CIPROFLOXACIN HCL 500 MG PO TABS
500.0000 mg | ORAL_TABLET | Freq: Once | ORAL | Status: AC
  Administered 2016-02-01: 500 mg via ORAL

## 2016-02-01 NOTE — Progress Notes (Signed)
02/01/2016 2:13 PM   [name] [birth year] [id number]  Referring provider: Leone Haven, MD [address] [place], [place] [address]  Chief Complaint  Patient presents with  . Cysto    HPI: The patient is a 82 year old female with a history of recurrent urinary tract infections and urinary retention that is managed with a Foley presents today for follow up due to chronic Foley catheter placement. She has not had any recent symptomatic urinary tract infections. She is otherwise doing well.     PMH: Past Medical History:  Diagnosis Date  . Acquired complete AV block ([place]) 06/22/2012   Overview:  While on atenolol.  Again on diltiazem. Pacemaker placed 06/2012.   Marland Kitchen Anemia   . Arrhythmia   . Arthritis   . Atrial fibrillation ([place])   . BP (high blood pressure) 03/03/2015  . Complete atrioventricular block ([place]) 06/22/2012   Overview:  While on atenolol.  Again on diltiazem. Pacemaker placed 06/2012.   . Essential hypertension 03/01/2015  . HTN (hypertension)   . Hyperlipidemia   . Ischemic colitis ([place])   . Mobitz type 2 second degree heart block   . Pacemaker   . Stroke ([place])   . Supraventricular tachycardia ([place]) 06/22/2012   Overview:  Short bursts at rates up to 150bpm.   . Syncope   . Thrombocytopenia ([place])   . UTI (lower urinary tract infection) 01/12/2015  . Viral upper respiratory infection 06/14/2015    Surgical History: Past Surgical History:  Procedure Laterality Date  . CHOLECYSTECTOMY    . Left Hip Replacement     . PACEMAKER PLACEMENT      Home Medications:    Medication List       Accurate as of 02/01/16  2:13 PM. Always use your most recent med list.          acetaminophen 325 MG tablet Commonly known as:  TYLENOL Take by mouth.   aspirin EC 81 MG tablet Take 81 mg by mouth daily.   carvedilol 12.5 MG tablet Commonly known as:  COREG Take 12.5 mg by mouth 2 (two) times daily.   Fish Oil 1000 MG Caps Take 2,000 mg by mouth  daily.   furosemide 20 MG tablet Commonly known as:  LASIX Take 10 mg by mouth daily as needed for edema.   hydrALAZINE 10 MG tablet Commonly known as:  APRESOLINE TAKE 1 TABLET BY MOUTH TWICE DAILY   HYDROcodone-acetaminophen 5-325 MG tablet Commonly known as:  NORCO/VICODIN Take 1 tablet by mouth 4 (four) times daily as needed for moderate pain.   latanoprost 0.005 % ophthalmic solution Commonly known as:  XALATAN Place 1 drop into both eyes at bedtime.   loperamide 2 MG capsule Commonly known as:  IMODIUM Take by mouth.   losartan 100 MG tablet Commonly known as:  COZAAR Take 50 mg by mouth 2 (two) times daily.   multivitamin with minerals Tabs tablet Take 1 tablet by mouth daily.   predniSONE 10 MG tablet Commonly known as:  DELTASONE Please take 50 mg (5 tablets) by mouth today, then decrease by one tablet by mouth daily until gone   PRESERVISION AREDS 2 Caps Take 2 capsules by mouth daily. Reported on 06/22/2015   ZINC OXIDE (TOPICAL) 10 % Crea Apply 1 application topically 2 (two) times daily. To affected area       Allergies:  Allergies  Allergen Reactions  . Atenolol Other (See Comments)    Reaction:  Complete Heart Block  .  Penicillins Other (See Comments)    rash  . Dilaudid [Hydromorphone Hcl] Rash  . Hydromorphone Rash    Family History: Family History  Problem Relation Age of Onset  . Stroke Mother   . Alcoholism Father   . Hypertension Sister   . Prostate cancer Neg Hx   . Bladder Cancer Neg Hx   . Kidney cancer Neg Hx     Social History:  reports that she has never smoked. She has never used smokeless tobacco. She reports that she drinks about 0.6 oz of alcohol per week . She reports that she does not use drugs.  ROS:                                        Physical Exam: BP (!) 164/67   Pulse 85   Ht 5\' 2"  (1.575 m)   Wt 116 lb (52.6 kg)   BMI 21.22 kg/m   Constitutional:  Alert and oriented, No acute  distress. HEENT: [place] AT, moist mucus membranes.  Trachea midline, no masses. Cardiovascular: No clubbing, cyanosis, or edema. Respiratory: Normal respiratory effort, no increased work of breathing. GI: Abdomen is soft, nontender, nondistended, no abdominal masses GU: No CVA tenderness.  Skin: No rashes, bruises or suspicious lesions. Lymph: No cervical or inguinal adenopathy. Neurologic: Grossly intact, no focal deficits, moving all 4 extremities. Psychiatric: Normal mood and affect.  Laboratory Data: Lab Results  Component Value Date   WBC 5.6 01/30/2016   HGB 11.5 (L) 01/30/2016   HCT 34.2 (L) 01/30/2016   MCV 87.0 01/30/2016   PLT 80 (L) 01/30/2016    Lab Results  Component Value Date   CREATININE 1.03 11/16/2015    No results found for: PSA  No results found for: TESTOSTERONE  No results found for: HGBA1C  Urinalysis    Component Value Date/Time   COLORURINE STRAW (A) 02/09/2015 1722   APPEARANCEUR CLEAR (A) 02/09/2015 1722   APPEARANCEUR CLEAR 01/13/2013 1238   LABSPEC 1.003 (L) 02/09/2015 1722   LABSPEC 1.005 01/13/2013 1238   PHURINE 6.0 02/09/2015 1722   GLUCOSEU NEGATIVE 02/09/2015 1722   GLUCOSEU NEGATIVE 01/13/2013 1238   HGBUR NEGATIVE 02/09/2015 1722   BILIRUBINUR small 03/17/2015 1359   BILIRUBINUR NEGATIVE 01/13/2013 [place] 02/09/2015 1722   PROTEINUR trace 03/17/2015 1359   PROTEINUR NEGATIVE 02/09/2015 1722   UROBILINOGEN 0.2 03/17/2015 1359   NITRITE positive 03/17/2015 1359   NITRITE NEGATIVE 02/09/2015 1722   LEUKOCYTESUR large (3+) (A) 03/17/2015 1359   LEUKOCYTESUR 3+ 01/13/2013 1238      Assessment & Plan:    1. Urinary retention -continue foley exchanges q4 weeks -Foley has been in for 1.5 years. Will not need surveillance cystoscopy until 5 years of continuous foley drainage of bladder  2. Recurrent UTI -resolved with foley placmeent  Return in about 4 weeks (around 02/29/2016) for nurse visit for foley  exchange. will need to see MD in one year.  Nickie Retort, MD  [facility] [address] [place], [place] [address] [phone]

## 2016-02-01 NOTE — Progress Notes (Signed)
Cath Change/ Replacement  Patient is present today for a catheter change due to urinary retention.  62ml of water was removed from the balloon, a 16FR foley cath was removed with out difficulty.  Patient was cleaned and prepped in a sterile fashion with betadine and 2% lidocaine jelly was instilled into the urethra. A 16 FR foley cath was replaced into the bladder no complications were noted Urine return was noted 76ml and urine was yellow and cloudy in color. The balloon was filled with 52ml of sterile water. A bed side  bag was attached for drainage.  A night bag was also given to the patient and patient was given instruction on how to change from one bag to another. Patient was given proper instruction on catheter care.    Preformed by: Fonnie Jarvis, CMA  Follow up: 1 month cath change

## 2016-02-06 DIAGNOSIS — I5022 Chronic systolic (congestive) heart failure: Secondary | ICD-10-CM | POA: Diagnosis not present

## 2016-02-15 ENCOUNTER — Encounter: Payer: Self-pay | Admitting: Family Medicine

## 2016-02-15 ENCOUNTER — Ambulatory Visit (INDEPENDENT_AMBULATORY_CARE_PROVIDER_SITE_OTHER): Payer: Medicare Other | Admitting: Family Medicine

## 2016-02-15 VITALS — BP 134/80 | HR 89 | Temp 98.2°F | Wt 117.6 lb

## 2016-02-15 DIAGNOSIS — Z23 Encounter for immunization: Secondary | ICD-10-CM | POA: Diagnosis not present

## 2016-02-15 DIAGNOSIS — I5022 Chronic systolic (congestive) heart failure: Secondary | ICD-10-CM | POA: Insufficient documentation

## 2016-02-15 DIAGNOSIS — E871 Hypo-osmolality and hyponatremia: Secondary | ICD-10-CM

## 2016-02-15 LAB — BASIC METABOLIC PANEL
BUN: 13 mg/dL (ref 6–23)
CHLORIDE: 100 meq/L (ref 96–112)
CO2: 26 meq/L (ref 19–32)
CREATININE: 0.91 mg/dL (ref 0.40–1.20)
Calcium: 9.2 mg/dL (ref 8.4–10.5)
GFR: 61.51 mL/min (ref >60.00)
GLUCOSE: 94 mg/dL (ref 70–99)
POTASSIUM: 4.4 meq/L (ref 3.5–5.1)
Sodium: 131 mEq/L — ABNORMAL LOW (ref 135–145)

## 2016-02-15 NOTE — Assessment & Plan Note (Signed)
At goal. Continue current medications. Monitor for lightheadedness.

## 2016-02-15 NOTE — Progress Notes (Signed)
Pre visit review using our clinic review tool, if applicable. No additional management support is needed unless otherwise documented below in the visit note. 

## 2016-02-15 NOTE — Assessment & Plan Note (Signed)
Patient appears to be euvolemic at this time. Continue Lasix as needed. Discussed taking Lasix with swelling or weight gain. Monitor for symptoms of CHF. Continue to follow with cardiology.

## 2016-02-15 NOTE — Assessment & Plan Note (Signed)
Patient with intermittent hyponatremia. Asymptomatic. Suspect related to volume status versus medication. We'll recheck today. She'll continue to monitor. Given return precautions.

## 2016-02-15 NOTE — Progress Notes (Signed)
  Tommi Rumps, MD Phone: [phone]  [name] is a 82 y.o. female who presents today for follow-up.  HYPERTENSION  Disease Monitoring  Home BP Monitoring not checking Chest pain- no    Dyspnea- no Medications  Compliance-  taking carvedilol, hydralazine, losartan. Lightheadedness-  no  Edema- no  Congestive heart failure: Patient had a recent echo that showed EF worsening to 25-30%. They're following with cardiology for this. She intermittently takes Lasix if she notes swelling. No recent swelling. No chest pain or shortness of breath. No orthopnea. Sounds as though they're not going to proceed with any further aggressive treatment for this.  Hyponatremia: Patient notes she's been watching her fluid intake. 6 bottles water daily. Half a bottle of Gatorade daily. No nausea, vomiting, imbalance, headaches, or confusion.  PMH: nonsmoker.   ROS see history of present illness  Objective  Physical Exam Vitals:   02/15/16 1039  BP: 134/80  Pulse: 89  Temp: 98.2 F (36.8 C)    BP Readings from Last 3 Encounters:  02/15/16 134/80  02/01/16 (!) 164/67  01/30/16 (!) 160/77   Wt Readings from Last 3 Encounters:  02/15/16 117 lb 9.6 oz (53.3 kg)  02/01/16 116 lb (52.6 kg)  01/30/16 125 lb (56.7 kg)    Physical Exam  Constitutional: No distress.  Cardiovascular: Normal rate, regular rhythm and normal heart sounds.   Pulmonary/Chest: Effort normal and breath sounds normal.  Neurological: She is alert.  Skin: Skin is warm and dry. She is not diaphoretic.     Assessment/Plan: Please see individual problem list.  Essential hypertension At goal. Continue current medications. Monitor for lightheadedness.  Chronic systolic congestive heart failure ([place]) Patient appears to be euvolemic at this time. Continue Lasix as needed. Discussed taking Lasix with swelling or weight gain. Monitor for symptoms of CHF. Continue to follow with cardiology.  Hyponatremia Patient with  intermittent hyponatremia. Asymptomatic. Suspect related to volume status versus medication. We'll recheck today. She'll continue to monitor. Given return precautions.   Orders Placed This Encounter  Procedures  . Flu vaccine HIGH DOSE PF  . Basic metabolic panel    Tommi Rumps, MD [place]

## 2016-02-15 NOTE — Patient Instructions (Signed)
Nice to see you. Your blood pressures in a good range. Please monitor your fluid intake. If you develop swelling or weight gain please take Lasix as prescribed. We will call with your lab results. Review of chest pain, shortness of breath, nausea, vomiting, confusion, or any new or changing symptoms please seek medical attention.

## 2016-02-27 ENCOUNTER — Inpatient Hospital Stay: Payer: Medicare Other | Attending: Hematology and Oncology

## 2016-02-27 DIAGNOSIS — E538 Deficiency of other specified B group vitamins: Secondary | ICD-10-CM | POA: Diagnosis not present

## 2016-02-27 DIAGNOSIS — D6959 Other secondary thrombocytopenia: Secondary | ICD-10-CM | POA: Insufficient documentation

## 2016-02-27 DIAGNOSIS — C9310 Chronic myelomonocytic leukemia not having achieved remission: Secondary | ICD-10-CM | POA: Diagnosis not present

## 2016-02-27 DIAGNOSIS — Z79899 Other long term (current) drug therapy: Secondary | ICD-10-CM | POA: Insufficient documentation

## 2016-02-27 MED ORDER — CYANOCOBALAMIN 1000 MCG/ML IJ SOLN
1000.0000 ug | Freq: Once | INTRAMUSCULAR | Status: AC
  Administered 2016-02-27: 1000 ug via INTRAMUSCULAR
  Filled 2016-02-27: qty 1

## 2016-02-28 ENCOUNTER — Ambulatory Visit: Payer: Medicare Other

## 2016-02-29 ENCOUNTER — Ambulatory Visit (INDEPENDENT_AMBULATORY_CARE_PROVIDER_SITE_OTHER): Payer: Medicare Other

## 2016-02-29 VITALS — BP 148/70 | HR 70 | Ht 62.0 in | Wt 116.4 lb

## 2016-02-29 DIAGNOSIS — R339 Retention of urine, unspecified: Secondary | ICD-10-CM

## 2016-02-29 NOTE — Progress Notes (Signed)
Cath Change/ Replacement  Patient is present today for a catheter change due to urinary retention.  76ml of water was removed from the balloon, a 16FR foley cath was removed with out difficulty.  Patient was cleaned and prepped in a sterile fashion with betadine and 2% lidocaine jelly was instilled into the urethra. A 16 FR foley cath was replaced into the bladder no complications were noted Urine return was noted 85ml and urine was yellow in color. The balloon was filled with 100ml of sterile water. A night bag was attached for drainage.   Patient was given proper instruction on catheter care.    Preformed by: Toniann Fail, LPN   Follow up: 1 mo.  Blood pressure (!) 148/70, pulse 70, height 5\' 2"  (1.575 m), weight 116 lb 6.4 oz (52.8 kg).

## 2016-03-12 ENCOUNTER — Other Ambulatory Visit: Payer: Self-pay | Admitting: Family Medicine

## 2016-03-12 ENCOUNTER — Ambulatory Visit: Payer: Medicare Other | Admitting: Sports Medicine

## 2016-03-12 ENCOUNTER — Ambulatory Visit (INDEPENDENT_AMBULATORY_CARE_PROVIDER_SITE_OTHER): Payer: Medicare Other | Admitting: Podiatry

## 2016-03-12 ENCOUNTER — Encounter: Payer: Self-pay | Admitting: Podiatry

## 2016-03-12 DIAGNOSIS — L603 Nail dystrophy: Secondary | ICD-10-CM

## 2016-03-12 DIAGNOSIS — M79676 Pain in unspecified toe(s): Secondary | ICD-10-CM | POA: Diagnosis not present

## 2016-03-12 DIAGNOSIS — B351 Tinea unguium: Secondary | ICD-10-CM | POA: Diagnosis not present

## 2016-03-12 DIAGNOSIS — L608 Other nail disorders: Secondary | ICD-10-CM

## 2016-03-12 NOTE — Progress Notes (Signed)

## 2016-03-19 ENCOUNTER — Ambulatory Visit (INDEPENDENT_AMBULATORY_CARE_PROVIDER_SITE_OTHER): Payer: Medicare Other

## 2016-03-19 VITALS — BP 128/68 | HR 73 | Resp 14 | Ht 61.0 in | Wt 118.8 lb

## 2016-03-19 DIAGNOSIS — Z0000 Encounter for general adult medical examination without abnormal findings: Secondary | ICD-10-CM | POA: Diagnosis not present

## 2016-03-19 NOTE — Patient Instructions (Addendum)
  [name] , Thank you for taking time to come for your Medicare Wellness Visit. I appreciate your ongoing commitment to your health goals. Please review the following plan we discussed and let me know if I can assist you in the future.   FOLLOW UP WITH DR. SONNENBERG AS NEEDED.  These are the goals we discussed: Goals    . Healthy Lifestyle          Stay hydrated and drink plenty of fluids. Stay active and continue walking for exercise. Eat a healthy diet and continue drinking protein drinks, as previously directed.         This is a list of the screening recommended for you and due dates:  Health Maintenance  Topic Date Due  . Tetanus Vaccine  07/19/1941  . DEXA scan (bone density measurement)  07/18/1989  . Shingles Vaccine  03/20/2017*  . Flu Shot  Completed  . Pneumonia vaccines  Addressed  *Topic was postponed. The date shown is not the original due date.    Bone Densitometry Bone densitometry is an imaging test that uses a special X-ray to measure the amount of calcium and other minerals in your bones (bone density). This test is also known as a bone mineral density test or dual-energy X-ray absorptiometry (DXA). The test can measure bone density at your hip and your spine. It is similar to having a regular X-ray. You may have this test to:  Diagnose a condition that causes weak or thin bones (osteoporosis).  Predict your risk of a broken bone (fracture).  Determine how well osteoporosis treatment is working. LET [facility] CARE PROVIDER KNOW ABOUT:  Any allergies you have.  All medicines you are taking, including vitamins, herbs, eye drops, creams, and over-the-counter medicines.  Previous problems you or members of your family have had with the use of anesthetics.  Any blood disorders you have.  Previous surgeries you have had.  Medical conditions you have.  Possibility of pregnancy.  Any other medical test you had within the previous 14 days that used  contrast material. RISKS AND COMPLICATIONS Generally, this is a safe procedure. However, problems can occur and may include the following:  This test exposes you to a very small amount of radiation.  The risks of radiation exposure may be greater to unborn children. BEFORE THE PROCEDURE  Do not take any calcium supplements for 24 hours before having the test. You can otherwise eat and drink what you usually do.  Take off all metal jewelry, eyeglasses, dental appliances, and any other metal objects. PROCEDURE  You may lie on an exam table. There will be an X-ray generator below you and an imaging device above you.  Other devices, such as boxes or braces, may be used to position your body properly for the scan.  You will need to lie still while the machine slowly scans your body.  The images will show up on a computer monitor. AFTER THE PROCEDURE You may need more testing at a later time.   This information is not intended to replace advice given to you by your health care provider. Make sure you discuss any questions you have with your health care provider.   Document Released: 06/11/2004 Document Revised: 06/10/2014 Document Reviewed: 10/28/2013 Elsevier Interactive Patient Education [employer].

## 2016-03-19 NOTE — Progress Notes (Signed)
Subjective:   Arvilla Homsey is a 82 y.o. female who presents for an Initial Medicare Annual Wellness Visit.  Review of Systems    No ROS.  Medicare Wellness Visit.  Cardiac Risk Factors include: advanced age (>66men, >55 women);hypertension     Objective:    Today's Vitals   03/19/16 1344  BP: 128/68  Pulse: 73  Resp: 14  SpO2: 98%  Weight: 118 lb 12.8 oz (53.9 kg)  Height: 5\' 1"  (1.549 m)   Body mass index is 22.45 kg/m.   Current Medications (verified) Outpatient Encounter Prescriptions as of 03/19/2016  Medication Sig  . acetaminophen (TYLENOL) 325 MG tablet Take by mouth.  Marland Kitchen aspirin EC 81 MG tablet Take 81 mg by mouth daily.  . carvedilol (COREG) 12.5 MG tablet Take 12.5 mg by mouth 2 (two) times daily.  . furosemide (LASIX) 20 MG tablet Take 10 mg by mouth daily as needed for edema.   . hydrALAZINE (APRESOLINE) 10 MG tablet TAKE 1 TABLET BY MOUTH TWICE DAILY  . latanoprost (XALATAN) 0.005 % ophthalmic solution Place 1 drop into both eyes at bedtime.  Marland Kitchen losartan (COZAAR) 100 MG tablet Take 50 mg by mouth 2 (two) times daily.  . Multiple Vitamin (MULTIVITAMIN WITH MINERALS) TABS tablet Take 1 tablet by mouth daily.  . Multiple Vitamins-Minerals (PRESERVISION AREDS 2) CAPS Take 2 capsules by mouth daily. Reported on 06/22/2015  . Omega-3 Fatty Acids (FISH OIL) 1000 MG CAPS Take 2,000 mg by mouth daily.   Marland Kitchen HYDROcodone-acetaminophen (NORCO/VICODIN) 5-325 MG per tablet Take 1 tablet by mouth 4 (four) times daily as needed for moderate pain.  . [DISCONTINUED] loperamide (IMODIUM) 2 MG capsule Take by mouth.  . [DISCONTINUED] predniSONE (DELTASONE) 10 MG tablet Please take 50 mg (5 tablets) by mouth today, then decrease by one tablet by mouth daily until gone  . [DISCONTINUED] ZINC OXIDE, TOPICAL, 10 % CREA Apply 1 application topically 2 (two) times daily. To affected area   No facility-administered encounter medications on file as of 03/19/2016.     Allergies  (verified) Atenolol; Penicillins; Dilaudid [hydromorphone hcl]; and Hydromorphone   History: Past Medical History:  Diagnosis Date  . Acquired complete AV block ([place]) 06/22/2012   Overview:  While on atenolol.  Again on diltiazem. Pacemaker placed 06/2012.   Marland Kitchen Anemia   . Arrhythmia   . Arthritis   . Atrial fibrillation ([place])   . BP (high blood pressure) 03/03/2015  . Complete atrioventricular block ([place]) 06/22/2012   Overview:  While on atenolol.  Again on diltiazem. Pacemaker placed 06/2012.   . Essential hypertension 03/01/2015  . HTN (hypertension)   . Hyperlipidemia   . Ischemic colitis ([place])   . Mobitz type 2 second degree heart block   . Pacemaker   . Stroke ([place])   . Supraventricular tachycardia ([place]) 06/22/2012   Overview:  Short bursts at rates up to 150bpm.   . Syncope   . Thrombocytopenia ([place])   . UTI (lower urinary tract infection) 01/12/2015  . Viral upper respiratory infection 06/14/2015   Past Surgical History:  Procedure Laterality Date  . CHOLECYSTECTOMY    . Left Hip Replacement     . PACEMAKER PLACEMENT     Family History  Problem Relation Age of Onset  . Stroke Mother   . Alcoholism Father   . Hypertension Sister   . Prostate cancer Neg Hx   . Bladder Cancer Neg Hx   . Kidney cancer Neg Hx    Social History  Occupational History  . Not on file.   Social History Main Topics  . Smoking status: Never Smoker  . Smokeless tobacco: Never Used  . Alcohol use 0.6 oz/week    1 Glasses of wine per week     Comment: Occasional   . Drug use: No  . Sexual activity: No    Tobacco Counseling Counseling given: Not Answered   Activities of Daily Living In your present state of health, do you have any difficulty performing the following activities: 03/19/2016  Hearing? Y  Vision? Y  Difficulty concentrating or making decisions? N  Walking or climbing stairs? Y  Dressing or bathing? Y  Doing errands, shopping? Y  Preparing Food and eating ? N  Using  the Toilet? N  In the past six months, have you accidently leaked urine? N  Do you have problems with loss of bowel control? N  Managing your Medications? N  Managing your Finances? Y  Housekeeping or managing your Housekeeping? Y  Some recent data might be hidden    Immunizations and Health Maintenance Immunization History  Administered Date(s) Administered  . Influenza, High Dose Seasonal PF 02/15/2016  . Influenza,inj,Quad PF,36+ Mos 05/11/2015  . Pneumococcal Conjugate-13 01/15/2013  . Pneumococcal Polysaccharide-23 01/17/2011   Health Maintenance Due  Topic Date Due  . TETANUS/TDAP  07[birth year]19/1941  . DEXA SCAN  07/18/1989    Patient Care Team: Leone Haven, MD as PCP - General (Family Medicine)  Indicate any recent Medical Services you may have received from other than [facility] providers in the past year (date may be approximate).     Assessment:   This is a routine wellness examination for [place]. The goal of the wellness visit is to assist the patient how to close the gaps in care and create a preventative care plan for the patient.   Osteoporosis risk reviewed.  DEXA Scan postponed per patient request. Educational material provided.  Medications reviewed; taking without issues or barriers.  HTN; managed with medication.  Stable and followed by PCP.  Safety issues reviewed; lives with daughter.  Alarm system with smoke detectors in the home. No firearms in the home. Wears seatbelts when riding with others. No violence in the home.  No identified risk were noted; The patient was oriented x 3; appropriate in dress and manner.  Some assistance with bathing/dressing required form care taker.  Medical aid in the home twice weekly.  Daughter assists as needed.  Body mass index; normal.  Discussed the importance of a healthy diet, water intake and exercise. She has a healthy diet, adequate water intake and walks for exercise. Educational material provided.  Podiatrist;  visits every 3 months ( Dr. Amalia Hailey)  Patient Concerns: None at this time. Follow up with PCP as needed.  Hearing/Vision screen Hearing Screening Comments: Followed by facility in [facility] Wears hearing aids  Vision Screening Comments: Followed by [facility] (Dr. Jeni Salles) Macular degeneration Visits every 6 months Bilateral cataracts extracted Wears glasses daily  Dietary issues and exercise activities discussed: Current Exercise Habits: Home exercise routine, Type of exercise: walking, Time (Minutes): 20, Frequency (Times/Week): 4, Weekly Exercise (Minutes/Week): 80, Intensity: Mild  Goals    . Healthy Lifestyle          Stay hydrated and drink plenty of fluids. Stay active and continue walking for exercise. Eat a healthy diet and continue drinking protein drinks, as previously directed.        Depression Screen PHQ 2/9 Scores 03/19/2016  PHQ -  2 Score 0    Fall Risk Fall Risk  03/19/2016  Falls in the past year? No    Cognitive Function: MMSE - Mini Mental State Exam 03/19/2016  Orientation to time 5  Orientation to Place 5  Registration 3  Attention/ Calculation 5  Recall 1  Language- name 2 objects 2  Language- repeat 1  Language- follow 3 step command 3  Language- read & follow direction 1  Write a sentence 1  Copy design 1  Total score 28    Screening Tests Health Maintenance  Topic Date Due  . TETANUS/TDAP  07[birth year]19/1943  . DEXA SCAN  07/18/1989  . ZOSTAVAX  03/20/2017 (Originally 07/18/1984)  . INFLUENZA VACCINE  Completed  . PNA vac Low Risk Adult  Addressed      Plan:    End of life planning; Advance aging; Advanced directives discussed. Copy of current HCPOA/Living Will on file.   During the course of the visit, Garrie was educated and counseled about the following appropriate screening and preventive services:   Vaccines to include Pneumoccal, Influenza, Hepatitis B, Td, Zostavax, HCV  Electrocardiogram  Cardiovascular disease  screening  Colorectal cancer screening  Bone density screening  Diabetes screening  Glaucoma screening  Mammography/PAP  Nutrition counseling  Smoking cessation counseling  Patient Instructions (the written plan) were given to the patient.    Varney Biles, LPN   [phone]

## 2016-03-27 ENCOUNTER — Inpatient Hospital Stay: Payer: Medicare Other | Attending: Hematology and Oncology

## 2016-03-27 ENCOUNTER — Other Ambulatory Visit: Payer: Self-pay | Admitting: *Deleted

## 2016-03-27 ENCOUNTER — Other Ambulatory Visit: Payer: Self-pay

## 2016-03-27 ENCOUNTER — Inpatient Hospital Stay: Payer: Medicare Other

## 2016-03-27 DIAGNOSIS — E538 Deficiency of other specified B group vitamins: Secondary | ICD-10-CM | POA: Diagnosis not present

## 2016-03-27 DIAGNOSIS — C9310 Chronic myelomonocytic leukemia not having achieved remission: Secondary | ICD-10-CM | POA: Insufficient documentation

## 2016-03-27 DIAGNOSIS — Z79899 Other long term (current) drug therapy: Secondary | ICD-10-CM | POA: Diagnosis not present

## 2016-03-27 DIAGNOSIS — D6959 Other secondary thrombocytopenia: Secondary | ICD-10-CM | POA: Diagnosis not present

## 2016-03-27 DIAGNOSIS — D6489 Other specified anemias: Secondary | ICD-10-CM

## 2016-03-27 DIAGNOSIS — D696 Thrombocytopenia, unspecified: Secondary | ICD-10-CM

## 2016-03-27 LAB — CBC WITH DIFFERENTIAL/PLATELET
Basophils Absolute: 0 10*3/uL (ref 0–0.1)
Basophils Relative: 1 %
Eosinophils Absolute: 0.1 10*3/uL (ref 0–0.7)
Eosinophils Relative: 1 %
HCT: 33.6 % — ABNORMAL LOW (ref 35.0–47.0)
Hemoglobin: 11.3 g/dL — ABNORMAL LOW (ref 12.0–16.0)
Lymphocytes Relative: 20 %
Lymphs Abs: 1.5 10*3/uL (ref 1.0–3.6)
MCH: 29.4 pg (ref 26.0–34.0)
MCHC: 33.8 g/dL (ref 32.0–36.0)
MCV: 87 fL (ref 80.0–100.0)
Monocytes Absolute: 1.9 10*3/uL — ABNORMAL HIGH (ref 0.2–0.9)
Monocytes Relative: 25 %
Neutro Abs: 4.1 10*3/uL (ref 1.4–6.5)
Neutrophils Relative %: 53 %
Platelets: 82 10*3/uL — ABNORMAL LOW (ref 150–440)
RBC: 3.86 MIL/uL (ref 3.80–5.20)
RDW: 16.1 % — ABNORMAL HIGH (ref 11.5–14.5)
WBC: 7.7 10*3/uL (ref 3.6–11.0)

## 2016-03-27 MED ORDER — CYANOCOBALAMIN 1000 MCG/ML IJ SOLN
1000.0000 ug | Freq: Once | INTRAMUSCULAR | Status: AC
  Administered 2016-03-27: 1000 ug via INTRAMUSCULAR
  Filled 2016-03-27: qty 1

## 2016-03-28 ENCOUNTER — Other Ambulatory Visit: Payer: Medicare Other

## 2016-03-28 ENCOUNTER — Ambulatory Visit: Payer: Medicare Other

## 2016-03-28 NOTE — Progress Notes (Signed)
Care was provided under my supervision. I agree with the management as indicated in the note.  Tehila Sokolow DO  

## 2016-03-29 ENCOUNTER — Ambulatory Visit: Payer: Medicare Other

## 2016-04-09 ENCOUNTER — Ambulatory Visit (INDEPENDENT_AMBULATORY_CARE_PROVIDER_SITE_OTHER): Payer: Medicare Other

## 2016-04-09 VITALS — BP 180/88 | HR 80 | Ht 62.0 in | Wt 116.9 lb

## 2016-04-09 DIAGNOSIS — R339 Retention of urine, unspecified: Secondary | ICD-10-CM

## 2016-04-09 NOTE — Progress Notes (Signed)
Cath Change/ Replacement  Patient is present today for a catheter change due to urinary retention.  68ml of water was removed from the balloon, a 16FR foley cath was removed with out difficulty.  Patient was cleaned and prepped in a sterile fashion with betadine and 2% lidocaine jelly was instilled into the urethra. A 16 FR foley cath was replaced into the bladder no complications were noted Urine return was noted 56ml and urine was yellow in color. The balloon was filled with 83ml of sterile water. A night bag was attached for drainage.  A night bag was also given to the patient and patient was given instruction on how to change from one bag to another. Patient was given proper instruction on catheter care.    Preformed by: Toniann Fail, LPN   Follow up: Due to protocol made pt daughter, Rosann Auerbach, aware that next month pt will need a f/u with a provider. Rosann Auerbach stated that Dr. Pilar Jarvis told them a f/u is not needed for 1 year. Reinforced with Rosann Auerbach the purpose/need of 64mo f/u. Rosann Auerbach stated that she would have f/u every 59mo. Therefore a f/u was made in Feb with Dr. Erlene Quan as Rosann Auerbach did not want to see a mid level provider.   There were no vitals taken for this visit.

## 2016-04-29 ENCOUNTER — Ambulatory Visit: Payer: Medicare Other

## 2016-04-30 ENCOUNTER — Ambulatory Visit: Payer: Medicare Other | Admitting: Hematology and Oncology

## 2016-04-30 ENCOUNTER — Inpatient Hospital Stay: Payer: Medicare Other | Attending: Hematology and Oncology

## 2016-04-30 ENCOUNTER — Other Ambulatory Visit: Payer: Self-pay | Admitting: Hematology and Oncology

## 2016-04-30 ENCOUNTER — Other Ambulatory Visit: Payer: Medicare Other

## 2016-04-30 DIAGNOSIS — D6959 Other secondary thrombocytopenia: Secondary | ICD-10-CM | POA: Diagnosis not present

## 2016-04-30 DIAGNOSIS — Z79899 Other long term (current) drug therapy: Secondary | ICD-10-CM | POA: Insufficient documentation

## 2016-04-30 DIAGNOSIS — C9310 Chronic myelomonocytic leukemia not having achieved remission: Secondary | ICD-10-CM | POA: Insufficient documentation

## 2016-04-30 DIAGNOSIS — E538 Deficiency of other specified B group vitamins: Secondary | ICD-10-CM | POA: Insufficient documentation

## 2016-04-30 MED ORDER — CYANOCOBALAMIN 1000 MCG/ML IJ SOLN
1000.0000 ug | Freq: Once | INTRAMUSCULAR | Status: AC
  Administered 2016-04-30: 1000 ug via INTRAMUSCULAR
  Filled 2016-04-30: qty 1

## 2016-05-07 ENCOUNTER — Encounter: Payer: Self-pay | Admitting: Emergency Medicine

## 2016-05-07 ENCOUNTER — Ambulatory Visit (INDEPENDENT_AMBULATORY_CARE_PROVIDER_SITE_OTHER): Payer: Medicare Other

## 2016-05-07 ENCOUNTER — Emergency Department
Admission: EM | Admit: 2016-05-07 | Discharge: 2016-05-07 | Disposition: A | Discharge status: Home / Self Care | Payer: Medicare Other | Attending: Emergency Medicine | Admitting: Emergency Medicine

## 2016-05-07 VITALS — BP 144/76 | HR 71 | Ht 61.0 in | Wt 110.0 lb

## 2016-05-07 DIAGNOSIS — Z79899 Other long term (current) drug therapy: Secondary | ICD-10-CM | POA: Insufficient documentation

## 2016-05-07 DIAGNOSIS — Z7982 Long term (current) use of aspirin: Secondary | ICD-10-CM | POA: Diagnosis not present

## 2016-05-07 DIAGNOSIS — I5022 Chronic systolic (congestive) heart failure: Secondary | ICD-10-CM | POA: Insufficient documentation

## 2016-05-07 DIAGNOSIS — Z950 Presence of cardiac pacemaker: Secondary | ICD-10-CM | POA: Diagnosis not present

## 2016-05-07 DIAGNOSIS — R6889 Other general symptoms and signs: Secondary | ICD-10-CM | POA: Diagnosis not present

## 2016-05-07 DIAGNOSIS — R339 Retention of urine, unspecified: Secondary | ICD-10-CM | POA: Diagnosis not present

## 2016-05-07 DIAGNOSIS — R252 Cramp and spasm: Secondary | ICD-10-CM

## 2016-05-07 DIAGNOSIS — I110 Hypertensive heart disease with heart failure: Secondary | ICD-10-CM | POA: Diagnosis not present

## 2016-05-07 DIAGNOSIS — W19XXXA Unspecified fall, initial encounter: Secondary | ICD-10-CM | POA: Diagnosis not present

## 2016-05-07 LAB — BASIC METABOLIC PANEL
ANION GAP: 9 (ref 5–15)
BUN: 15 mg/dL (ref 6–20)
CALCIUM: 9.1 mg/dL (ref 8.9–10.3)
CO2: 22 mmol/L (ref 22–32)
Chloride: 98 mmol/L — ABNORMAL LOW (ref 101–111)
Creatinine, Ser: 1.14 mg/dL — ABNORMAL HIGH (ref 0.44–1.00)
GFR calc Af Amer: 47 mL/min — ABNORMAL LOW (ref >60)
GFR, EST NON AFRICAN AMERICAN: 41 mL/min — AB (ref >60)
Glucose, Bld: 114 mg/dL — ABNORMAL HIGH (ref 65–99)
POTASSIUM: 4.2 mmol/L (ref 3.5–5.1)
SODIUM: 129 mmol/L — AB (ref 135–145)

## 2016-05-07 LAB — CBC
HEMATOCRIT: 33.1 % — AB (ref 35.0–47.0)
HEMOGLOBIN: 11.3 g/dL — AB (ref 12.0–16.0)
MCH: 29.8 pg (ref 26.0–34.0)
MCHC: 34.1 g/dL (ref 32.0–36.0)
MCV: 87.4 fL (ref 80.0–100.0)
Platelets: 66 10*3/uL — ABNORMAL LOW (ref 150–440)
RBC: 3.79 MIL/uL — ABNORMAL LOW (ref 3.80–5.20)
RDW: 15.3 % — AB (ref 11.5–14.5)
WBC: 9 10*3/uL (ref 3.6–11.0)

## 2016-05-07 LAB — GLUCOSE, CAPILLARY: GLUCOSE-CAPILLARY: 113 mg/dL — AB (ref 65–99)

## 2016-05-07 NOTE — Progress Notes (Signed)
Cath Change/ Replacement  Patient is present today for a catheter change due to urinary retention.  18ml of water was removed from the balloon, a 16FR foley cath was removed with out difficulty.  Patient was cleaned and prepped in a sterile fashion with betadine and 2% lidocaine jelly was instilled into the urethra. A 16 FR foley cath was replaced into the bladder no complications were noted Urine return was noted 48ml and urine was yellow in color. The balloon was filled with 23ml of sterile water. A night  bag was attached for drainage.  A night bag was also given to the patient and patient was given instruction on how to change from one bag to another. Patient was given proper instruction on catheter care.    Preformed by: Toniann Fail, LPN   Blood pressure (!) 144/76, pulse 71, height 5\' 1"  (1.549 m), weight 110 lb (49.9 kg).

## 2016-05-07 NOTE — ED Notes (Signed)
Family at bedside. 

## 2016-05-07 NOTE — ED Notes (Signed)
ED Provider at bedside. 

## 2016-05-07 NOTE — Discharge Instructions (Signed)
You were evaluated for leg pains after walking around this morning, and as we discussed her exam and evaluation are reassuring in the emergency department today. Your blood work shows mild dehydration, make sure you're drinking plenty of fluids.  Please discuss with the primary doctor about whether or not he should be evaluated by a vascular surgeon for peripheral vascular disease. As we discussed, I suspect mild dehydration contributed to muscle cramps in the legs after walking around.  Return to the emergency department for any worsening pain, any weakness, numbness, or tingling, discoloration, skin rash, or any other symptoms concerning to you.

## 2016-05-07 NOTE — ED Notes (Signed)
cbg 113 

## 2016-05-07 NOTE — ED Triage Notes (Addendum)
Pt to ED via EMS from [employer] [facility] where she has near syncopal episode. Per EMS pt had sudden onset weakness and pain to lower extremities, denies fall or LOC, family caught pt before falling. Pt A&Ox4, VS stable. Pt has pacemaker and chronic foley placed to decrease UTI. Denies any CP or SOB

## 2016-05-07 NOTE — ED Provider Notes (Signed)
[facility] Emergency Department Provider Note ____________________________________________   I have reviewed the triage vital signs and the triage nursing note.  HISTORY  Chief Complaint Weakness   Historian Patient and daughter   HPI [name] is a 82 y.o. female came in here with her daughter after legs cramping. She had been at her urologist to have her Foley catheter changed this morning and then they went shopping. While they were shopping she started to get bilateral calf cramps and then sat down because it is painful. No trouble breathing or shortness of breath. No palpitations.  She does not typically get the symptoms nor has she ever had them before.  Symptoms are resolved now.    Past Medical History:  Diagnosis Date  . Acquired complete AV block ([place]) 06/22/2012   Overview:  While on atenolol.  Again on diltiazem. Pacemaker placed 06/2012.   Marland Kitchen Anemia   . Arrhythmia   . Arthritis   . Atrial fibrillation ([place])   . BP (high blood pressure) 03/03/2015  . 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  Again on diltiazem. Pacemaker placed 06/2012.   . Essential hypertension 03/01/2015  . HTN (hypertension)   . Hyperlipidemia   . Ischemic colitis ([place])   . Mobitz type 2 second degree heart block   . Pacemaker   . Stroke ([place])   . Supraventricular tachycardia ([place]) 06/22/2012   Overview:  Short bursts at rates up to 150bpm.   . Syncope   . Thrombocytopenia ([place])   . UTI (lower urinary tract infection) 01/12/2015  . Viral upper respiratory infection 06/14/2015    Patient Active Problem List   Diagnosis Date Noted  . Chronic systolic congestive heart failure ([place]) 02/15/2016  . Hyponatremia 02/15/2016  . Viral upper respiratory infection 06/14/2015  . Status post left hip replacement 05/22/2015  . Anemia 05/01/2015  . Right knee injury 04/25/2015  . Low back pain 04/25/2015  . Ischemic colitis ([place])  04/20/2015  . Bad odor of urine 03/17/2015  . Stage II pressure ulcer of left buttock 03/13/2015  . Artificial cardiac pacemaker 03/03/2015  . HLD (hyperlipidemia) 03/03/2015  . Colitis, ischemic ([place]) 03/03/2015  . Candidal intertrigo, inguinal 03/01/2015  . Essential hypertension 03/01/2015  . Monocytosis 02/09/2015  . Thrombocytopenia ([place]) 02/09/2015  . B12 deficiency 02/09/2015  . Urinary retention 02/09/2015  . Pressure ulcer of left coccygeal region, stage 2 02/09/2015  . CMML (chronic myelomonocytic leukemia) ([place]) 02/02/2015  . Bacteremia 01/12/2015  . UTI (lower urinary tract infection) 01/12/2015  . Acquired complete AV block ([place]) 06/22/2012  . Supraventricular tachycardia ([place]) 06/22/2012  . Complete atrioventricular block ([place]) 06/22/2012    Past Surgical History:  Procedure Laterality Date  . CHOLECYSTECTOMY    . Left Hip Replacement     . PACEMAKER PLACEMENT      Prior to Admission medications   Medication Sig Start Date End Date Taking? Authorizing Provider  acetaminophen (TYLENOL) 325 MG tablet Take by mouth.    Historical Provider, MD  aspirin EC 81 MG tablet Take 81 mg by mouth daily.    Historical Provider, MD  carvedilol (COREG) 12.5 MG tablet Take 12.5 mg by mouth 2 (two) times daily.    Historical Provider, MD  furosemide (LASIX) 20 MG tablet Take 10 mg by mouth daily as needed for edema.     Historical Provider, MD  hydrALAZINE (APRESOLINE) 10 MG tablet TAKE 1 TABLET BY MOUTH TWICE DAILY 03/13/16  Leone Haven, MD  HYDROcodone-acetaminophen (NORCO/VICODIN) 5-325 MG per tablet Take 1 tablet by mouth 4 (four) times daily as needed for moderate pain.    Historical Provider, MD  latanoprost (XALATAN) 0.005 % ophthalmic solution Place 1 drop into both eyes at bedtime.    Historical Provider, MD  losartan (COZAAR) 100 MG tablet Take 50 mg by mouth 2 (two) times daily.    Historical Provider, MD  Multiple Vitamin (MULTIVITAMIN WITH MINERALS) TABS tablet  Take 1 tablet by mouth daily.    Historical Provider, MD  Multiple Vitamins-Minerals (PRESERVISION AREDS 2) CAPS Take 2 capsules by mouth daily. Reported on 06/22/2015    Historical Provider, MD  Omega-3 Fatty Acids (FISH OIL) 1000 MG CAPS Take 2,000 mg by mouth daily.     Historical Provider, MD    Allergies  Allergen Reactions  . Atenolol Other (See Comments)    Reaction:  Complete Heart Block  . Penicillins Other (See Comments)    rash  . Dilaudid [Hydromorphone Hcl] Rash  . Hydromorphone Rash    Family History  Problem Relation Age of Onset  . Stroke Mother   . Alcoholism Father   . Hypertension Sister   . Prostate cancer Neg Hx   . Bladder Cancer Neg Hx   . Kidney cancer Neg Hx     Social History Social History  Substance Use Topics  . Smoking status: Never Smoker  . Smokeless tobacco: Never Used  . Alcohol use 0.6 oz/week    1 Glasses of wine per week     Comment: Occasional     Review of Systems  Constitutional: Negative for fever. Eyes: Negative for visual changes. ENT: Negative for sore throat. Cardiovascular: Negative for chest pain. Respiratory: Negative for shortness of breath. Gastrointestinal: Negative for abdominal pain, vomiting and diarrhea. Genitourinary: Negative for dysuria. Musculoskeletal: Negative for back pain. Skin: Negative for rash. Neurological: Negative for headache. 10 point Review of Systems otherwise negative ____________________________________________   PHYSICAL EXAM:  VITAL SIGNS: ED Triage Vitals  Enc Vitals Group     BP 05/07/16 1302 (!) 146/76     Pulse Rate 05/07/16 1302 71     Resp 05/07/16 1258 16     Temp --      Temp Source 05/07/16 1258 Oral     SpO2 05/07/16 1302 98 %     Weight --      Height --      Head Circumference --      Peak Flow --      Pain Score 05/07/16 1258 0     Pain Loc --      Pain Edu? --      Excl. in [place]? --      Constitutional: Alert and oriented. Well appearing and in no distress.   Slightly hard of hearing. HEENT   Head: Normocephalic and atraumatic.      Eyes: Conjunctivae are normal. PERRL. Normal extraocular movements.      Ears:         Nose: No congestion/rhinnorhea.   Mouth/Throat: Mucous membranes are moist.   Neck: No stridor. Cardiovascular/Chest: Normal rate, regular rhythm.  No murmurs, rubs, or gallops. Respiratory: Normal respiratory effort without tachypnea nor retractions. Breath sounds are clear and equal bilaterally. No wheezes/rales/rhonchi. Gastrointestinal: Soft. No distention, no guarding, no rebound. Nontender.    Genitourinary/rectal:Deferred Musculoskeletal: Nontender with normal range of motion in all extremities. No joint effusions.  No lower extremity tenderness.  No edema. Neurologic:  Normal speech  and language. No gross or focal neurologic deficits are appreciated. Skin:  Skin is warm, dry and intact. No rash noted. Psychiatric: Mood and affect are normal. Speech and behavior are normal. Patient exhibits appropriate insight and judgment.   ____________________________________________  LABS (pertinent positives/negatives)  Labs Reviewed  BASIC METABOLIC PANEL - Abnormal; Notable for the following:       Result Value   Sodium 129 (*)    Chloride 98 (*)    Glucose, Bld 114 (*)    Creatinine, Ser 1.14 (*)    GFR calc non Af Amer 41 (*)    GFR calc Af Amer 47 (*)    All other components within normal limits  CBC - Abnormal; Notable for the following:    RBC 3.79 (*)    Hemoglobin 11.3 (*)    HCT 33.1 (*)    RDW 15.3 (*)    Platelets 66 (*)    All other components within normal limits  GLUCOSE, CAPILLARY - Abnormal; Notable for the following:    Glucose-Capillary 113 (*)    All other components within normal limits  CBG MONITORING, ED    ____________________________________________    EKG I, Lisa Roca, MD, the attending physician have personally viewed and interpreted all ECGs.  73 bpm. Atrial fibrillation  ventricularly paced rhythm.   ____________________________________________  RADIOLOGY All Xrays were viewed by me. Imaging interpreted by Radiologist.  None __________________________________________  PROCEDURES  Procedure(s) performed: None  Critical Care performed: None  ____________________________________________   ED COURSE / ASSESSMENT AND PLAN  Pertinent labs & imaging results that were available during my care of the patient were reviewed by me and considered in my medical decision making (see chart for details).   Ms. Maryruth Eve is overall well-appearing and feels back to baseline now. Her laboratory studies show mild dehydration and asked her to make sure that she is hydrating well. I'm suspicious that she had leg cramping possibly due to walking around and mild dehydration. We discussed possibly for peripheral vascular disease and have asked her to have her regular doctor and discuss possible vascular surgery referral.  In any case, symptoms not consistent with DVT or acute arterial occlusion.  [facility] for discharge home today.   CONSULTATIONS:      Patient / Family / Caregiver informed of clinical course, medical decision-making process, and agree with plan.   I discussed return precautions, follow-up instructions, and discharge instructions with patient and/or family.   ___________________________________________   FINAL CLINICAL IMPRESSION(S) / ED DIAGNOSES   Final diagnoses:  Bilateral leg cramps              Note: This dictation was prepared with Dragon dictation. Any transcriptional errors that result from this process are unintentional    Lisa Roca, MD 05/07/16 1528

## 2016-05-11 ENCOUNTER — Inpatient Hospital Stay: Payer: Medicare Other

## 2016-05-11 ENCOUNTER — Emergency Department: Payer: Medicare Other

## 2016-05-11 ENCOUNTER — Encounter: Payer: Self-pay | Admitting: Emergency Medicine

## 2016-05-11 ENCOUNTER — Other Ambulatory Visit: Payer: Self-pay

## 2016-05-11 ENCOUNTER — Inpatient Hospital Stay
Admission: EM | Admit: 2016-05-11 | Discharge: 2016-05-14 | DRG: 673 | Disposition: A | Discharge status: Skilled Nursing Facility | Payer: Medicare Other | Attending: Internal Medicine | Admitting: Internal Medicine

## 2016-05-11 DIAGNOSIS — N179 Acute kidney failure, unspecified: Secondary | ICD-10-CM | POA: Diagnosis present

## 2016-05-11 DIAGNOSIS — D696 Thrombocytopenia, unspecified: Secondary | ICD-10-CM | POA: Diagnosis not present

## 2016-05-11 DIAGNOSIS — I5022 Chronic systolic (congestive) heart failure: Secondary | ICD-10-CM | POA: Diagnosis present

## 2016-05-11 DIAGNOSIS — Z823 Family history of stroke: Secondary | ICD-10-CM

## 2016-05-11 DIAGNOSIS — M6281 Muscle weakness (generalized): Secondary | ICD-10-CM | POA: Diagnosis not present

## 2016-05-11 DIAGNOSIS — I82419 Acute embolism and thrombosis of unspecified femoral vein: Secondary | ICD-10-CM

## 2016-05-11 DIAGNOSIS — N390 Urinary tract infection, site not specified: Secondary | ICD-10-CM | POA: Diagnosis not present

## 2016-05-11 DIAGNOSIS — Z9049 Acquired absence of other specified parts of digestive tract: Secondary | ICD-10-CM | POA: Diagnosis not present

## 2016-05-11 DIAGNOSIS — Z96642 Presence of left artificial hip joint: Secondary | ICD-10-CM | POA: Diagnosis present

## 2016-05-11 DIAGNOSIS — I82431 Acute embolism and thrombosis of right popliteal vein: Secondary | ICD-10-CM | POA: Diagnosis not present

## 2016-05-11 DIAGNOSIS — Z950 Presence of cardiac pacemaker: Secondary | ICD-10-CM

## 2016-05-11 DIAGNOSIS — Z880 Allergy status to penicillin: Secondary | ICD-10-CM | POA: Diagnosis not present

## 2016-05-11 DIAGNOSIS — Z811 Family history of alcohol abuse and dependence: Secondary | ICD-10-CM

## 2016-05-11 DIAGNOSIS — I442 Atrioventricular block, complete: Secondary | ICD-10-CM | POA: Diagnosis not present

## 2016-05-11 DIAGNOSIS — I82409 Acute embolism and thrombosis of unspecified deep veins of unspecified lower extremity: Secondary | ICD-10-CM | POA: Diagnosis not present

## 2016-05-11 DIAGNOSIS — E860 Dehydration: Secondary | ICD-10-CM | POA: Diagnosis present

## 2016-05-11 DIAGNOSIS — Z87440 Personal history of urinary (tract) infections: Secondary | ICD-10-CM | POA: Diagnosis not present

## 2016-05-11 DIAGNOSIS — Z743 Need for continuous supervision: Secondary | ICD-10-CM | POA: Diagnosis not present

## 2016-05-11 DIAGNOSIS — T83511A Infection and inflammatory reaction due to indwelling urethral catheter, initial encounter: Principal | ICD-10-CM | POA: Diagnosis present

## 2016-05-11 DIAGNOSIS — B9620 Unspecified Escherichia coli [E. coli] as the cause of diseases classified elsewhere: Secondary | ICD-10-CM | POA: Diagnosis not present

## 2016-05-11 DIAGNOSIS — R609 Edema, unspecified: Secondary | ICD-10-CM

## 2016-05-11 DIAGNOSIS — R531 Weakness: Secondary | ICD-10-CM | POA: Diagnosis not present

## 2016-05-11 DIAGNOSIS — N319 Neuromuscular dysfunction of bladder, unspecified: Secondary | ICD-10-CM | POA: Diagnosis not present

## 2016-05-11 DIAGNOSIS — R339 Retention of urine, unspecified: Secondary | ICD-10-CM | POA: Diagnosis not present

## 2016-05-11 DIAGNOSIS — I82411 Acute embolism and thrombosis of right femoral vein: Secondary | ICD-10-CM | POA: Diagnosis present

## 2016-05-11 DIAGNOSIS — Z8673 Personal history of transient ischemic attack (TIA), and cerebral infarction without residual deficits: Secondary | ICD-10-CM | POA: Diagnosis not present

## 2016-05-11 DIAGNOSIS — Z7982 Long term (current) use of aspirin: Secondary | ICD-10-CM | POA: Diagnosis not present

## 2016-05-11 DIAGNOSIS — E871 Hypo-osmolality and hyponatremia: Secondary | ICD-10-CM | POA: Diagnosis present

## 2016-05-11 DIAGNOSIS — R2681 Unsteadiness on feet: Secondary | ICD-10-CM

## 2016-05-11 DIAGNOSIS — E878 Other disorders of electrolyte and fluid balance, not elsewhere classified: Secondary | ICD-10-CM | POA: Diagnosis present

## 2016-05-11 DIAGNOSIS — G9340 Encephalopathy, unspecified: Secondary | ICD-10-CM | POA: Diagnosis not present

## 2016-05-11 DIAGNOSIS — K559 Vascular disorder of intestine, unspecified: Secondary | ICD-10-CM | POA: Diagnosis present

## 2016-05-11 DIAGNOSIS — E785 Hyperlipidemia, unspecified: Secondary | ICD-10-CM | POA: Diagnosis present

## 2016-05-11 DIAGNOSIS — I130 Hypertensive heart and chronic kidney disease with heart failure and stage 1 through stage 4 chronic kidney disease, or unspecified chronic kidney disease: Secondary | ICD-10-CM | POA: Diagnosis present

## 2016-05-11 DIAGNOSIS — R262 Difficulty in walking, not elsewhere classified: Secondary | ICD-10-CM

## 2016-05-11 DIAGNOSIS — R338 Other retention of urine: Secondary | ICD-10-CM | POA: Diagnosis not present

## 2016-05-11 DIAGNOSIS — Z466 Encounter for fitting and adjustment of urinary device: Secondary | ICD-10-CM | POA: Diagnosis not present

## 2016-05-11 DIAGNOSIS — Z8249 Family history of ischemic heart disease and other diseases of the circulatory system: Secondary | ICD-10-CM

## 2016-05-11 DIAGNOSIS — G9341 Metabolic encephalopathy: Secondary | ICD-10-CM | POA: Diagnosis present

## 2016-05-11 DIAGNOSIS — R05 Cough: Secondary | ICD-10-CM | POA: Diagnosis not present

## 2016-05-11 DIAGNOSIS — I4891 Unspecified atrial fibrillation: Secondary | ICD-10-CM | POA: Diagnosis present

## 2016-05-11 DIAGNOSIS — C9310 Chronic myelomonocytic leukemia not having achieved remission: Secondary | ICD-10-CM | POA: Diagnosis present

## 2016-05-11 DIAGNOSIS — R4182 Altered mental status, unspecified: Secondary | ICD-10-CM | POA: Diagnosis not present

## 2016-05-11 DIAGNOSIS — I10 Essential (primary) hypertension: Secondary | ICD-10-CM | POA: Diagnosis not present

## 2016-05-11 DIAGNOSIS — Z888 Allergy status to other drugs, medicaments and biological substances status: Secondary | ICD-10-CM

## 2016-05-11 DIAGNOSIS — I82519 Chronic embolism and thrombosis of unspecified femoral vein: Secondary | ICD-10-CM | POA: Diagnosis not present

## 2016-05-11 DIAGNOSIS — R6889 Other general symptoms and signs: Secondary | ICD-10-CM | POA: Diagnosis not present

## 2016-05-11 DIAGNOSIS — I82401 Acute embolism and thrombosis of unspecified deep veins of right lower extremity: Secondary | ICD-10-CM | POA: Diagnosis not present

## 2016-05-11 LAB — CBC WITH DIFFERENTIAL/PLATELET
BASOS ABS: 0 10*3/uL (ref 0–0.1)
Basophils Relative: 0 %
Eosinophils Absolute: 0 10*3/uL (ref 0–0.7)
Eosinophils Relative: 0 %
HEMATOCRIT: 33.4 % — AB (ref 35.0–47.0)
Hemoglobin: 11.3 g/dL — ABNORMAL LOW (ref 12.0–16.0)
LYMPHS ABS: 1.2 10*3/uL (ref 1.0–3.6)
Lymphocytes Relative: 9 %
MCH: 29.2 pg (ref 26.0–34.0)
MCHC: 33.9 g/dL (ref 32.0–36.0)
MCV: 85.9 fL (ref 80.0–100.0)
MONO ABS: 4.5 10*3/uL — AB (ref 0.2–0.9)
MONOS PCT: 34 %
NEUTROS ABS: 7.4 10*3/uL — AB (ref 1.4–6.5)
Neutrophils Relative %: 57 %
PLATELETS: 39 10*3/uL — AB (ref 150–440)
RBC: 3.89 MIL/uL (ref 3.80–5.20)
RDW: 14.9 % — AB (ref 11.5–14.5)
WBC: 13.1 10*3/uL — AB (ref 3.6–11.0)

## 2016-05-11 LAB — LACTIC ACID, PLASMA: LACTIC ACID, VENOUS: 0.9 mmol/L (ref 0.5–1.9)

## 2016-05-11 LAB — COMPREHENSIVE METABOLIC PANEL
ALK PHOS: 61 U/L (ref 38–126)
ALT: 11 U/L — AB (ref 14–54)
AST: 19 U/L (ref 15–41)
Albumin: 3.4 g/dL — ABNORMAL LOW (ref 3.5–5.0)
Anion gap: 8 (ref 5–15)
BILIRUBIN TOTAL: 1.1 mg/dL (ref 0.3–1.2)
BUN: 12 mg/dL (ref 6–20)
CALCIUM: 9.1 mg/dL (ref 8.9–10.3)
CO2: 23 mmol/L (ref 22–32)
CREATININE: 1.05 mg/dL — AB (ref 0.44–1.00)
Chloride: 94 mmol/L — ABNORMAL LOW (ref 101–111)
GFR, EST AFRICAN AMERICAN: 52 mL/min — AB (ref >60)
GFR, EST NON AFRICAN AMERICAN: 45 mL/min — AB (ref >60)
Glucose, Bld: 125 mg/dL — ABNORMAL HIGH (ref 65–99)
Potassium: 4 mmol/L (ref 3.5–5.1)
Sodium: 125 mmol/L — ABNORMAL LOW (ref 135–145)
TOTAL PROTEIN: 7.2 g/dL (ref 6.5–8.1)

## 2016-05-11 LAB — URINALYSIS, COMPLETE (UACMP) WITH MICROSCOPIC
Bilirubin Urine: NEGATIVE
Glucose, UA: NEGATIVE mg/dL
KETONES UR: NEGATIVE mg/dL
Nitrite: POSITIVE — AB
PROTEIN: NEGATIVE mg/dL
Specific Gravity, Urine: 1.006 (ref 1.005–1.030)
pH: 6 (ref 5.0–8.0)

## 2016-05-11 LAB — INFLUENZA PANEL BY PCR (TYPE A & B)
INFLBPCR: NEGATIVE
Influenza A By PCR: NEGATIVE

## 2016-05-11 MED ORDER — DEXTROSE 5 % IV SOLN
1.0000 g | INTRAVENOUS | Status: DC
Start: 2016-05-12 — End: 2016-05-11

## 2016-05-11 MED ORDER — ONDANSETRON HCL 4 MG/2ML IJ SOLN: 4.0000 mg | Freq: Four times a day (QID) | INTRAMUSCULAR | Status: DC | PRN

## 2016-05-11 MED ORDER — DEXTROSE 5 % IV SOLN: 1.0000 g | Freq: Once | INTRAVENOUS | Status: DC

## 2016-05-11 MED ORDER — OCUVITE-LUTEIN PO CAPS
2.0000 | ORAL_CAPSULE | Freq: Every day | ORAL | Status: DC
  Administered 2016-05-11 – 2016-05-14 (×4): 2 via ORAL
  Filled 2016-05-11 (×4): qty 2

## 2016-05-11 MED ORDER — ASPIRIN EC 81 MG PO TBEC
81.0000 mg | DELAYED_RELEASE_TABLET | Freq: Every day | ORAL | Status: DC
  Administered 2016-05-12 – 2016-05-14 (×3): 81 mg via ORAL
  Filled 2016-05-11 (×3): qty 1

## 2016-05-11 MED ORDER — CARVEDILOL 12.5 MG PO TABS
12.5000 mg | ORAL_TABLET | Freq: Two times a day (BID) | ORAL | Status: DC
  Administered 2016-05-11 – 2016-05-14 (×6): 12.5 mg via ORAL
  Filled 2016-05-11: qty 2
  Filled 2016-05-11 (×2): qty 1
  Filled 2016-05-11 (×3): qty 2

## 2016-05-11 MED ORDER — LOSARTAN POTASSIUM 50 MG PO TABS
50.0000 mg | ORAL_TABLET | Freq: Two times a day (BID) | ORAL | Status: DC
Start: 2016-05-11 — End: 2016-05-14
  Administered 2016-05-11 – 2016-05-14 (×6): 50 mg via ORAL
  Filled 2016-05-11 (×6): qty 1

## 2016-05-11 MED ORDER — PANTOPRAZOLE SODIUM 40 MG PO TBEC
40.0000 mg | DELAYED_RELEASE_TABLET | Freq: Every day | ORAL | Status: DC
  Administered 2016-05-12 – 2016-05-14 (×3): 40 mg via ORAL
  Filled 2016-05-11 (×3): qty 1

## 2016-05-11 MED ORDER — ACETAMINOPHEN 650 MG RE SUPP: 650.0000 mg | Freq: Four times a day (QID) | RECTAL | Status: DC | PRN

## 2016-05-11 MED ORDER — ADULT MULTIVITAMIN W/MINERALS CH
1.0000 | ORAL_TABLET | Freq: Every day | ORAL | Status: DC
  Administered 2016-05-11 – 2016-05-14 (×4): 1 via ORAL
  Filled 2016-05-11 (×4): qty 1

## 2016-05-11 MED ORDER — LATANOPROST 0.005 % OP SOLN
1.0000 [drp] | Freq: Every day | OPHTHALMIC | Status: DC
Start: 2016-05-11 — End: 2016-05-14
  Administered 2016-05-11 – 2016-05-13 (×2): 1 [drp] via OPHTHALMIC
  Filled 2016-05-11: qty 2.5

## 2016-05-11 MED ORDER — SODIUM CHLORIDE 0.9 % IV BOLUS (SEPSIS)
500.0000 mL | Freq: Once | INTRAVENOUS | Status: AC
  Administered 2016-05-11: 500 mL via INTRAVENOUS

## 2016-05-11 MED ORDER — DOCUSATE SODIUM 100 MG PO CAPS
100.0000 mg | ORAL_CAPSULE | Freq: Two times a day (BID) | ORAL | Status: DC
  Administered 2016-05-11 – 2016-05-14 (×6): 100 mg via ORAL
  Filled 2016-05-11 (×6): qty 1

## 2016-05-11 MED ORDER — ACETAMINOPHEN 325 MG PO TABS
650.0000 mg | ORAL_TABLET | Freq: Four times a day (QID) | ORAL | Status: DC | PRN
  Administered 2016-05-11 – 2016-05-13 (×2): 650 mg via ORAL
  Filled 2016-05-11 (×2): qty 2

## 2016-05-11 MED ORDER — HEPARIN SODIUM (PORCINE) 5000 UNIT/ML IJ SOLN: 5000.0000 [IU] | Freq: Three times a day (TID) | INTRAMUSCULAR | Status: DC

## 2016-05-11 MED ORDER — CEFTRIAXONE SODIUM-DEXTROSE 1-3.74 GM-% IV SOLR
1.0000 g | INTRAVENOUS | Status: DC
  Administered 2016-05-12 – 2016-05-13 (×2): 1 g via INTRAVENOUS
  Filled 2016-05-11 (×2): qty 50

## 2016-05-11 MED ORDER — BISACODYL 10 MG RE SUPP: 10.0000 mg | Freq: Every day | RECTAL | Status: DC | PRN

## 2016-05-11 MED ORDER — SODIUM CHLORIDE 0.9 % IV SOLN
INTRAVENOUS | Status: DC
  Administered 2016-05-11 – 2016-05-13 (×4): via INTRAVENOUS

## 2016-05-11 MED ORDER — CEFTRIAXONE SODIUM-DEXTROSE 1-3.74 GM-% IV SOLR
1.0000 g | Freq: Once | INTRAVENOUS | Status: AC
  Administered 2016-05-11: 18:00:00 1 g via INTRAVENOUS
  Filled 2016-05-11: qty 50

## 2016-05-11 MED ORDER — HYDRALAZINE HCL 10 MG PO TABS
10.0000 mg | ORAL_TABLET | Freq: Two times a day (BID) | ORAL | Status: DC
  Administered 2016-05-11 – 2016-05-14 (×6): 10 mg via ORAL
  Filled 2016-05-11 (×6): qty 1

## 2016-05-11 MED ORDER — HYDROCODONE-ACETAMINOPHEN 5-325 MG PO TABS: 1.0000 | ORAL_TABLET | Freq: Four times a day (QID) | ORAL | Status: DC | PRN

## 2016-05-11 MED ORDER — ONDANSETRON HCL 4 MG PO TABS: 4.0000 mg | ORAL_TABLET | Freq: Four times a day (QID) | ORAL | Status: DC | PRN

## 2016-05-11 NOTE — ED Notes (Signed)
Pt transported to room 110 

## 2016-05-11 NOTE — H&P (Signed)
History and Physical    [name] [id number] DOB: 06-Oct-1922 DOA: 05/11/2016  Referring physician: Dr. Burlene Arnt PCP: Tommi Rumps, MD  Specialists: Dr. Pilar Jarvis  Chief Complaint: fever, confusion, and weakness  HPI: [name] is a 82 y.o. female has a past medical history significant for HTN, CAD, SSS s/p pacer and hx of recurrent UTI's with chronic indwelling Foley now with fever, confusion, and weakness. Unable to ambulate. Was brought to ER where she was noted to be febrile with leukocytosis. UA grossly abnormal. She is now admitted. Lives with family who are unable to care for her at this time due to severe weakness and inability to ambulate.  Review of Systems: The patient denies anorexia, weight loss,, vision loss, decreased hearing, hoarseness, chest pain, syncope, dyspnea on exertion, peripheral edema, balance deficits, hemoptysis, abdominal pain, melena, hematochezia, severe indigestion/heartburn, hematuria, incontinence, genital sores, suspicious skin lesions, transient blindness, depression, unusual weight change, abnormal bleeding, enlarged lymph nodes, angioedema, and breast masses.   Past Medical History:  Diagnosis Date  . Acquired complete AV block ([place]) 06/22/2012   Overview:  While on atenolol.  Again on diltiazem. Pacemaker placed 06/2012.   Marland Kitchen Anemia   . Arrhythmia   . Arthritis   . Atrial fibrillation ([place])   . BP (high blood pressure) 03/03/2015  . Complete atrioventricular block ([place]) 06/22/2012   Overview:  While on atenolol.  Again on diltiazem. Pacemaker placed 06/2012.   . Essential hypertension 03/01/2015  . HTN (hypertension)   . Hyperlipidemia   . Ischemic colitis ([place])   . Mobitz type 2 second degree heart block   . Pacemaker   . Stroke ([place])   . Supraventricular tachycardia ([place]) 06/22/2012   Overview:  Short bursts at rates up to 150bpm.   . Syncope   . Thrombocytopenia ([place])   . UTI (lower urinary tract infection) 01/12/2015  . Viral upper  respiratory infection 06/14/2015   Past Surgical History:  Procedure Laterality Date  . CHOLECYSTECTOMY    . Left Hip Replacement     . PACEMAKER PLACEMENT     Social History:  reports that she has never smoked. She has never used smokeless tobacco. She reports that she drinks about 0.6 oz of alcohol per week . She reports that she does not use drugs.  Allergies  Allergen Reactions  . Atenolol Other (See Comments)    Reaction:  Complete Heart Block  . Penicillins Other (See Comments)    rash  . Dilaudid [Hydromorphone Hcl] Rash  . Hydromorphone Rash    Family History  Problem Relation Age of Onset  . Stroke Mother   . Alcoholism Father   . Hypertension Sister   . Prostate cancer Neg Hx   . Bladder Cancer Neg Hx   . Kidney cancer Neg Hx     Prior to Admission medications   Medication Sig Start Date End Date Taking? Authorizing Provider  acetaminophen (TYLENOL) 325 MG tablet Take by mouth.    Historical Provider, MD  aspirin EC 81 MG tablet Take 81 mg by mouth daily.    Historical Provider, MD  carvedilol (COREG) 12.5 MG tablet Take 12.5 mg by mouth 2 (two) times daily.    Historical Provider, MD  furosemide (LASIX) 20 MG tablet Take 10 mg by mouth daily as needed for edema.     Historical Provider, MD  hydrALAZINE (APRESOLINE) 10 MG tablet TAKE 1 TABLET BY MOUTH TWICE DAILY 03/13/16   Leone Haven, MD  HYDROcodone-acetaminophen (NORCO/VICODIN) 5-325 MG  per tablet Take 1 tablet by mouth 4 (four) times daily as needed for moderate pain.    Historical Provider, MD  latanoprost (XALATAN) 0.005 % ophthalmic solution Place 1 drop into both eyes at bedtime.    Historical Provider, MD  losartan (COZAAR) 100 MG tablet Take 50 mg by mouth 2 (two) times daily.    Historical Provider, MD  Multiple Vitamin (MULTIVITAMIN WITH MINERALS) TABS tablet Take 1 tablet by mouth daily.    Historical Provider, MD  Multiple Vitamins-Minerals (PRESERVISION AREDS 2) CAPS Take 2 capsules by mouth  daily. Reported on 06/22/2015    Historical Provider, MD  Omega-3 Fatty Acids (FISH OIL) 1000 MG CAPS Take 2,000 mg by mouth daily.     Historical Provider, MD   Physical Exam: Vitals:   05/11/16 1323  BP: (!) 147/67  Pulse: 93  Resp: (!) 23  Temp: 99.8 F (37.7 C)  TempSrc: Oral  SpO2: 96%  Weight: 49.9 kg (110 lb)  Height: 5\' 2"  (1.575 m)     General:  No apparent distress, WDWN, [place]/AT  Eyes: PERRL, EOMI, no scleral icterus, conjunctiva clear  ENT: moist oropharynx without exudate, TM's benign, dentition fair  Neck: supple, no lymphadenopathy. No bruits or thyromegaly  Cardiovascular: regular rate without MRG; 2+ peripheral pulses, no JVD, trace peripheral edema  Respiratory: CTA biL, good air movement without wheezing, rhonchi or crackled. Respiratory effort normal  Abdomen: soft, non tender to palpation, positive bowel sounds, no guarding, no rebound  Skin: no rashes or lesions  Musculoskeletal: normal bulk and tone, no joint swelling  Psychiatric: normal mood and affect, A&OX 2(unsure of date)  Neurologic: CN 2-12 grossly intact, Motor strength 4+/5 in all 4 groups with symmetric DTR's and non-focal sensory exam  Labs on Admission:  Basic Metabolic Panel:  Recent Labs Lab 05/07/16 1254 05/11/16 1305  NA 129* 125*  K 4.2 4.0  CL 98* 94*  CO2 22 23  GLUCOSE 114* 125*  BUN 15 12  CREATININE 1.14* 1.05*  CALCIUM 9.1 9.1   Liver Function Tests:  Recent Labs Lab 05/11/16 1305  AST 19  ALT 11*  ALKPHOS 61  BILITOT 1.1  PROT 7.2  ALBUMIN 3.4*   No results for input(s): LIPASE, AMYLASE in the last 168 hours. No results for input(s): AMMONIA in the last 168 hours. CBC:  Recent Labs Lab 05/07/16 1254 05/11/16 1305  WBC 9.0 13.1*  NEUTROABS  --  7.4*  HGB 11.3* 11.3*  HCT 33.1* 33.4*  MCV 87.4 85.9  PLT 66* 39*   Cardiac Enzymes: No results for input(s): CKTOTAL, CKMB, CKMBINDEX, TROPONINI in the last 168 hours.  BNP (last 3 results) No  results for input(s): BNP in the last 8760 hours.  ProBNP (last 3 results) No results for input(s): PROBNP in the last 8760 hours.  CBG:  Recent Labs Lab 05/07/16 1308  GLUCAP 113*    Radiological Exams on Admission: Dg Chest 2 View  Result Date: 05/11/2016 CLINICAL DATA:  Cough, SOB non smoker EXAM: CHEST  2 VIEW COMPARISON:  01/31/2015 FINDINGS: Left-sided transvenous pacemaker leads to the right atrium and right ventricle. The heart is enlarged. There are no focal consolidations or pleural effusions. Mildly prominent interstitial marking are consistent with mild interstitial pulmonary edema. IMPRESSION: Cardiomegaly and mild interstitial edema. Electronically Signed   By: Nolon Nations M.D.   On: 05/11/2016 14:07    EKG: Independently reviewed.  Assessment/Plan Principal Problem:   UTI (urinary tract infection) Active Problems:   Urinary  retention   Encephalopathy acute   Generalized weakness   Will pull Foley. Consult Urology. Blood cultures sent. Begin IV fluids and IV ABX. Consult PT and CM. Continue outpatient meds. Repeat labs in AM  Diet: heart healthy Fluids: NS@100  DVT Prophylaxis: SQ Heparin  Code Status: full  Family Communication: YES  Disposition Plan: HOME  Time spent: 55 min

## 2016-05-11 NOTE — ED Notes (Signed)
Report called to Maddy, RN.

## 2016-05-11 NOTE — ED Provider Notes (Signed)
[facility] Emergency Department Provider Note  ____________________________________________   I have reviewed the triage vital signs and the nursing notes.   HISTORY  Chief Complaint Weakness; Fall; and Altered Mental Status    HPI [name] is a 82 y.o. female who presents to the complaining of feeling weak and a little confused. Patient isn't doing fully, she had her Foley changed on Tuesday. After that she has some cramping. That went away, and yesterday, things were "okay", then this morning at 3 AM She got up to go change her Foley bag, she has a Foley for chronic UTIs apparently, and she felt her cell to be weak and woozy. She states that she sank to her knees. She did not pass out. She was unable to get up however he crawled to her room where she called for family. She did not have a touch of time. On the ground however she has been weak and a little bit confused going to family since this time. They feel this is exactly how she normally acts and she has UTI. Patient recalls falling, she does not believe she hit her head. Positive fever of 101 at home. Urine appears different and the Foley, cloudy     Past Medical History:  Diagnosis Date  . Acquired complete AV block ([place]) 06/22/2012   Overview:  While on atenolol.  Again on diltiazem. Pacemaker placed 06/2012.   Marland Kitchen Anemia   . Arrhythmia   . Arthritis   . Atrial fibrillation ([place])   . BP (high blood pressure) 03/03/2015  . Complete atrioventricular block ([place]) 06/22/2012   Overview:  While on atenolol.  Again on diltiazem. Pacemaker placed 06/2012.   . Essential hypertension 03/01/2015  . HTN (hypertension)   . Hyperlipidemia   . Ischemic colitis ([place])   . Mobitz type 2 second degree heart block   . Pacemaker   . Stroke ([place])   . Supraventricular tachycardia ([place]) 06/22/2012   Overview:  Short bursts at rates up to 150bpm.   . Syncope   . Thrombocytopenia ([place])   . UTI (lower urinary tract  infection) 01/12/2015  . Viral upper respiratory infection 06/14/2015    Patient Active Problem List   Diagnosis Date Noted  . Chronic systolic congestive heart failure ([place]) 02/15/2016  . Hyponatremia 02/15/2016  . Viral upper respiratory infection 06/14/2015  . Status post left hip replacement 05/22/2015  . Anemia 05/01/2015  . Right knee injury 04/25/2015  . Low back pain 04/25/2015  . Ischemic colitis ([place]) 04/20/2015  . Bad odor of urine 03/17/2015  . Stage II pressure ulcer of left buttock 03/13/2015  . Artificial cardiac pacemaker 03/03/2015  . HLD (hyperlipidemia) 03/03/2015  . Colitis, ischemic ([place]) 03/03/2015  . Candidal intertrigo, inguinal 03/01/2015  . Essential hypertension 03/01/2015  . Monocytosis 02/09/2015  . Thrombocytopenia ([place]) 02/09/2015  . B12 deficiency 02/09/2015  . Urinary retention 02/09/2015  . Pressure ulcer of left coccygeal region, stage 2 02/09/2015  . CMML (chronic myelomonocytic leukemia) ([place]) 02/02/2015  . Bacteremia 01/12/2015  . UTI (lower urinary tract infection) 01/12/2015  . Acquired complete AV block ([place]) 06/22/2012  . Supraventricular tachycardia ([place]) 06/22/2012  . Complete atrioventricular block ([place]) 06/22/2012    Past Surgical History:  Procedure Laterality Date  . CHOLECYSTECTOMY    . Left Hip Replacement     . PACEMAKER PLACEMENT      Prior to Admission medications   Medication Sig Start Date End Date Taking? Authorizing Provider  acetaminophen (TYLENOL)  325 MG tablet Take by mouth.    Historical Provider, MD  aspirin EC 81 MG tablet Take 81 mg by mouth daily.    Historical Provider, MD  carvedilol (COREG) 12.5 MG tablet Take 12.5 mg by mouth 2 (two) times daily.    Historical Provider, MD  furosemide (LASIX) 20 MG tablet Take 10 mg by mouth daily as needed for edema.     Historical Provider, MD  hydrALAZINE (APRESOLINE) 10 MG tablet TAKE 1 TABLET BY MOUTH TWICE DAILY 03/13/16   Leone Haven, MD   HYDROcodone-acetaminophen (NORCO/VICODIN) 5-325 MG per tablet Take 1 tablet by mouth 4 (four) times daily as needed for moderate pain.    Historical Provider, MD  latanoprost (XALATAN) 0.005 % ophthalmic solution Place 1 drop into both eyes at bedtime.    Historical Provider, MD  losartan (COZAAR) 100 MG tablet Take 50 mg by mouth 2 (two) times daily.    Historical Provider, MD  Multiple Vitamin (MULTIVITAMIN WITH MINERALS) TABS tablet Take 1 tablet by mouth daily.    Historical Provider, MD  Multiple Vitamins-Minerals (PRESERVISION AREDS 2) CAPS Take 2 capsules by mouth daily. Reported on 06/22/2015    Historical Provider, MD  Omega-3 Fatty Acids (FISH OIL) 1000 MG CAPS Take 2,000 mg by mouth daily.     Historical Provider, MD    Allergies Atenolol; Penicillins; Dilaudid [hydromorphone hcl]; and Hydromorphone  Family History  Problem Relation Age of Onset  . Stroke Mother   . Alcoholism Father   . Hypertension Sister   . Prostate cancer Neg Hx   . Bladder Cancer Neg Hx   . Kidney cancer Neg Hx     Social History Social History  Substance Use Topics  . Smoking status: Never Smoker  . Smokeless tobacco: Never Used  . Alcohol use 0.6 oz/week    1 Glasses of wine per week     Comment: Occasional     Review of Systems Constitutional: No fever/chills Eyes: No visual changes. ENT: No sore throat. No stiff neck no neck pain Cardiovascular: Denies chest pain. Respiratory: Denies shortness of breath. Gastrointestinal:   no vomiting.  No diarrhea.  No constipation. Genitourinary: Negative for dysuria. Musculoskeletal: Negative lower extremity swelling Skin: Negative for rash. Neurological: Negative for severe headaches, focal weakness or numbness. 10-point ROS otherwise negative.  ____________________________________________   PHYSICAL EXAM:  VITAL SIGNS: ED Triage Vitals  Enc Vitals Group     BP 05/11/16 1323 (!) 147/67     Pulse Rate 05/11/16 1323 93     Resp 05/11/16  1323 (!) 23     Temp 05/11/16 1323 99.8 F (37.7 C)     Temp Source 05/11/16 1323 Oral     SpO2 05/11/16 1323 96 %     Weight 05/11/16 1323 110 lb (49.9 kg)     Height 05/11/16 1323 5\' 2"  (1.575 m)     Head Circumference --      Peak Flow --      Pain Score 05/11/16 1324 0     Pain Loc --      Pain Edu? --      Excl. in [place]? --     Constitutional: Alert and oriented. Well appearing and in no acute distress. Eyes: Conjunctivae are normal. PERRL. EOMI. Head: Atraumatic. Nose: No congestion/rhinnorhea. Mouth/Throat: Mucous membranes are moist.  Oropharynx non-erythematous. Neck: No stridor.   Nontender with no meningismus Cardiovascular: Normal rate, regular rhythm. Grossly normal heart sounds.  Good peripheral circulation. Respiratory: Normal  respiratory effort.  No retractions. Lungs CTAB. Abdominal: Soft and nontender. No distention. No guarding no rebound Back:  There is no focal tenderness or step off.  there is no midline tenderness there are no lesions noted. there is no CVA tenderness Musculoskeletal: No lower extremity tenderness, no upper extremity tenderness. No joint effusions, no DVT signs strong distal pulses no edema Neurologic:  Normal speech and language. No gross focal neurologic deficits are appreciated.  Skin:  Skin is warm, dry and intact. No rash noted. Psychiatric: Mood and affect are normal. Speech and behavior are normal.  ____________________________________________   LABS (all labs ordered are listed, but only abnormal results are displayed)  Labs Reviewed  COMPREHENSIVE METABOLIC PANEL - Abnormal; Notable for the following:       Result Value   Sodium 125 (*)    Chloride 94 (*)    Glucose, Bld 125 (*)    Creatinine, Ser 1.05 (*)    Albumin 3.4 (*)    ALT 11 (*)    GFR calc non Af Amer 45 (*)    GFR calc Af Amer 52 (*)    All other components within normal limits  CBC WITH DIFFERENTIAL/PLATELET - Abnormal; Notable for the following:    WBC 13.1  (*)    Hemoglobin 11.3 (*)    HCT 33.4 (*)    RDW 14.9 (*)    Platelets 39 (*)    Neutro Abs 7.4 (*)    Monocytes Absolute 4.5 (*)    All other components within normal limits  URINALYSIS, COMPLETE (UACMP) WITH MICROSCOPIC - Abnormal; Notable for the following:    APPearance CLOUDY (*)    Hgb urine dipstick MODERATE (*)    Nitrite POSITIVE (*)    Leukocytes, UA LARGE (*)    Squamous Epithelial / LPF 0-5 (*)    Bacteria, UA MANY (*)    All other components within normal limits  URINE CULTURE  CULTURE, BLOOD (ROUTINE X 2)  CULTURE, BLOOD (ROUTINE X 2)  LACTIC ACID, PLASMA  LACTIC ACID, PLASMA  INFLUENZA PANEL BY PCR (TYPE A & B, H1N1)   ____________________________________________  EKG  I personally interpreted any EKGs ordered by me or triage AV paced rhythm rate 92 ____________________________________________  RADIOLOGY  I reviewed any imaging ordered by me or triage that were performed during my shift and, if possible, patient and/or family made aware of any abnormal findings. ____________________________________________   PROCEDURES  Procedure(s) performed: None  Procedures  Critical Care performed: None  ____________________________________________   INITIAL IMPRESSION / ASSESSMENT AND PLAN / ED COURSE  Pertinent labs & imaging results that were available during my care of the patient were reviewed by me and considered in my medical decision making (see chart for details).  Patient with urinary tract infection, generalized weakness, recurrent falls and fever. We will give her Rocephin as she is not allergic to cephalosporins and there is a low cross-reactivity to cephalosporins from penicillin. Her prior cultures do suggested she is sensitive to this. We will change the Foley. We will admit to the hospital.  Clinical Course    ____________________________________________   FINAL CLINICAL IMPRESSION(S) / ED DIAGNOSES  Final diagnoses:  None       This chart was dictated using voice recognition software.  Despite best efforts to proofread,  errors can occur which can change meaning.       Schuyler Amor, MD 05/11/16 [phone]

## 2016-05-11 NOTE — Consult Note (Signed)
Urology Consult  Referring physician: Leonie Douglas. [place], MD Reason for referral: Chronic Urinary Retention with Recurrent UTI's  Chief Complaint: "Weakness; Fall; and Altered Mental Status," per ER note; "Fever, Confusion, and Weakness," per Admission H&P (Pt is a difficult historian due to being severely hard of hearing and denies any discomfort - pleasant, cooperative)  History of Present Illness: 82 y.o. WF who was brought to the [facility] ER by family following a near syncopal episode while attempting to manipulate her foley bag (pt with h/o recurrent urinary retention with associated recurrent UTI's since 01/2015). No head trauma with the fall, per pt.  She was unable to get back up due to weakness, but was able to crawl for help from family. Per the family, the pt has been a little confused, similar to when she has had UTI's in the past. Family reports fever to 71 F at home with cloudy urine.  In the ER, T 99.1F, P 93, RR 23, BP 147/67, O2 Sat 96%. Labs with leukocytosis (WBC 13.1k), stable chronic anemia, worsening thrombocytopenia, mild worsening of chronic hyponatremia with hypochloremia, stable CKD 3 (Cr 1.05, eGFR 45). Lactic Acid nL (0.9). UA with cloudy yellow urine, Nitrite positive, tntc wbc, 6-30 rbc, many bacteria (foley - changed in the ER; C&S sent, BC x2 sent)  Given the pt's altered mentation/hyponatremia, leukocytosis, tachypnea, tachycardia, and dehydration with likely recurrent UTI, in the context of her advanced age, the patient was admitted to the [facility] for IV Abx's (Rocephin), IV Hydration.   Foley catheter removed at 5pm. Pt would spike a fever to 101.1F at 7:27pm  Prior GU History:  01/10/2015 - 01/12/2015 [facility] Admission (E. Coli Lower UTI/Bacteremia/Sepsis) -pt was rx'd with IV Rocephin x 48hrs, then Septra DS x 10 days -pt developed fever, dysuria, freq/nocturia, and back pain 8 days after completing the course of Septra  01/31/2015 - 02/07/2015 [place]  Admission (E. Coli UTI/Sepsis) -ID Consult (Dr. Ola Spurr): pt with 3 UTI's over the prior 6 weeks prior to admission - Stone CT recommended -Stone CT: left hydro (no stone), moderate bladder distention, ?new T12 compression fx (c/w 01/13/2013) -02/01/2015 Pt voided 473m with BS PVR 026m-02/02/2015 Urology Consultation (Dr. PaNicolette Bang -foley placed   -02/05/2015 RUS: stable, persistent moderate left hydro -02/07/2015 Foley removed, per pt request, at discharge  [facility] -Bladder Scan 77592m pt voided, then foley placed with 550m92mtained w/immediate relief of SP pain -UC&S neg  03/03/2015 Urology F/U (Dr. BudzPilar Jarvisonthly foley changes instituted  03/17/2015 Malodorous Urine - UC&S: Klebsiella - rx'd w/Omnicef  04/06/2015 Urology F/U (Dr. BudzPilar Jarvisencouraged treating only symptomatic UTI's (not just for malodor)  04/20/2015 Fever to 101F with weakness/lethargy (E. Coli) -rx'd with Bactrim DS bid x 1 week  05/25/2015 Urology F/U (Dr. BudzPilar Jarvisdoing well without recurrent UTI's  11/16/2015 Dr. SonnCaryl Bisontinues well without recurrent UTI's  02/01/2016 Urology F/U (Dr. BudzPilar Jarviscontinues well without recurrent UTI's -Cysto at 5 yrs of continuous indwelling foley recommended -continue monthly foley changes  05/07/2016 Foley Change -[facility] ER: c/o leg cramps   Past Medical History:  Diagnosis Date  . Acquired complete AV block (HCC)[place]20/2014   Overview:  While on atenolol.  Again on diltiazem. Pacemaker placed 06/2012.   . AnMarland Kitchenmia   . Arrhythmia   . Arthritis   . Atrial fibrillation (HCC)[place]. BP (high blood pressure) 03/03/2015  . Complete atrioventricular block (HCC)[place]20/2014   Overview:  While on atenolol.  Again on diltiazem. Pacemaker placed 06/2012.   . Essential hypertension 03/01/2015  . HTN (hypertension)   . Hyperlipidemia   . Ischemic colitis ([place])   . Mobitz type 2 second degree heart block   . Pacemaker   . Stroke ([place])    . Supraventricular tachycardia ([place]) 06/22/2012   Overview:  Short bursts at rates up to 150bpm.   . Syncope   . Thrombocytopenia ([place])   . UTI (lower urinary tract infection) 01/12/2015  . Viral upper respiratory infection 06/14/2015   Past Surgical History:  Procedure Laterality Date  . CHOLECYSTECTOMY    . Left Hip Replacement     . PACEMAKER PLACEMENT      Medications:  I have reviewed the patient's current medications. Prior to Admission:  Prescriptions Prior to Admission  Medication Sig Dispense Refill Last Dose  . acetaminophen (TYLENOL) 325 MG tablet Take by mouth.   Taking  . aspirin EC 81 MG tablet Take 81 mg by mouth daily.   Taking  . carvedilol (COREG) 12.5 MG tablet Take 12.5 mg by mouth 2 (two) times daily.   Taking  . furosemide (LASIX) 20 MG tablet Take 10 mg by mouth daily as needed for edema.    Taking  . hydrALAZINE (APRESOLINE) 10 MG tablet TAKE 1 TABLET BY MOUTH TWICE DAILY 60 tablet 2 Taking  . HYDROcodone-acetaminophen (NORCO/VICODIN) 5-325 MG per tablet Take 1 tablet by mouth 4 (four) times daily as needed for moderate pain.   Not Taking  . latanoprost (XALATAN) 0.005 % ophthalmic solution Place 1 drop into both eyes at bedtime.   Taking  . losartan (COZAAR) 100 MG tablet Take 50 mg by mouth 2 (two) times daily.   Taking  . Multiple Vitamin (MULTIVITAMIN WITH MINERALS) TABS tablet Take 1 tablet by mouth daily.   Taking  . Multiple Vitamins-Minerals (PRESERVISION AREDS 2) CAPS Take 2 capsules by mouth daily. Reported on 06/22/2015   Taking  . Omega-3 Fatty Acids (FISH OIL) 1000 MG CAPS Take 2,000 mg by mouth daily.    Taking   Scheduled: . aspirin EC  81 mg Oral Daily  . carvedilol  12.5 mg Oral BID  . cefTRIAXone  1 g Intravenous Q24H  . docusate sodium  100 mg Oral BID  . hydrALAZINE  10 mg Oral BID  . latanoprost  1 drop Both Eyes QHS  . losartan  50 mg Oral BID  . multivitamin with minerals  1 tablet Oral Daily  . multivitamin-lutein  2 capsule Oral  Daily  . pantoprazole  40 mg Oral Daily   Continuous: . sodium chloride 100 mL/hr at 05/11/16 1731   ZLD:[id number] **OR** acetaminophen, bisacodyl, HYDROcodone-acetaminophen, ondansetron **OR** ondansetron (ZOFRAN) IV Allergies:  Allergies  Allergen Reactions  . Atenolol Other (See Comments)    Reaction:  Complete Heart Block  . Penicillins Other (See Comments)    rash  . Dilaudid [Hydromorphone Hcl] Rash  . Hydromorphone Rash    Family History  Problem Relation Age of Onset  . Stroke Mother   . Alcoholism Father   . Hypertension Sister   . Prostate cancer Neg Hx   . Bladder Cancer Neg Hx   . Kidney cancer Neg Hx    Social History:  reports that she has never smoked. She has never used smokeless tobacco. She reports that she drinks about 0.6 oz of alcohol per week . She reports that she does not use drugs.  ROS per the detailed H&P by  Dr. Doy Hutching 05/11/2016  Physical Exam:  Vital signs in last 24 hours: Temp:  [99.8 F (37.7 C)-101.8 F (38.8 C)] 100 F (37.8 C) (12/09 2042) Pulse Rate:  [85-93] 93 (12/09 2042) Resp:  [15-25] 20 (12/09 2042) BP: (122-147)/(56-68) 122/58 (12/09 2042) SpO2:  [93 %-97 %] 93 % (12/09 2042) Weight:  [49.9 kg (110 lb)-54.9 kg (121 lb 1.6 oz)] 54.9 kg (121 lb 1.6 oz) (12/09 1708) Physical Exam  Nursing note and vitals reviewed. Constitutional: She appears well-developed and well-nourished. No distress.  HENT:  Head: Normocephalic and atraumatic.  Nose: Nose normal.  Mouth/Throat: Oropharynx is clear and moist.  Eyes: Conjunctivae and EOM are normal. No scleral icterus.  Neck: Normal range of motion. Neck supple. No JVD present. No tracheal deviation present. No thyromegaly present.  Cardiovascular: Normal rate, regular rhythm, normal heart sounds and intact distal pulses.  Exam reveals no gallop and no friction rub.   No murmur heard. Respiratory: Effort normal and breath sounds normal. No stridor. No respiratory distress. She has no  wheezes. She has no rales. She exhibits no tenderness.  GI: Soft. Bowel sounds are normal. She exhibits no distension and no mass. There is no tenderness. There is no rebound and no guarding.  Musculoskeletal: Normal range of motion. She exhibits edema. She exhibits no tenderness.  Lymphadenopathy:    She has no cervical adenopathy.  Neurological: She is alert.  Skin: Skin is warm and dry. No rash noted. She is not diaphoretic. There is erythema.  Psychiatric: She has a normal mood and affect. Her behavior is normal.  RLE erythema with mild edema - NT  Laboratory Data:  Results for orders placed or performed during the hospital encounter of 05/11/16 (from the past 72 hour(s))  Comprehensive metabolic panel     Status: Abnormal   Collection Time: 05/11/16  1:05 PM  Result Value Ref Range   Sodium 125 (L) 135 - 145 mmol/L   Potassium 4.0 3.5 - 5.1 mmol/L   Chloride 94 (L) 101 - 111 mmol/L   CO2 23 22 - 32 mmol/L   Glucose, Bld 125 (H) 65 - 99 mg/dL   BUN 12 6 - 20 mg/dL   Creatinine, Ser 1.05 (H) 0.44 - 1.00 mg/dL   Calcium 9.1 8.9 - 10.3 mg/dL   Total Protein 7.2 6.5 - 8.1 g/dL   Albumin 3.4 (L) 3.5 - 5.0 g/dL   AST 19 15 - 41 U/L   ALT 11 (L) 14 - 54 U/L   Alkaline Phosphatase 61 38 - 126 U/L   Total Bilirubin 1.1 0.3 - 1.2 mg/dL   GFR calc non Af Amer 45 (L) >60 mL/min   GFR calc Af Amer 52 (L) >60 mL/min    Comment: (NOTE) The eGFR has been calculated using the CKD EPI equation. This calculation has not been validated in all clinical situations. eGFR's persistently <60 mL/min signify possible Chronic Kidney Disease.    Anion gap 8 5 - 15  Lactic acid, plasma     Status: None   Collection Time: 05/11/16  1:05 PM  Result Value Ref Range   Lactic Acid, Venous 0.9 0.5 - 1.9 mmol/L  CBC with Differential     Status: Abnormal   Collection Time: 05/11/16  1:05 PM  Result Value Ref Range   WBC 13.1 (H) 3.6 - 11.0 K/uL   RBC 3.89 3.80 - 5.20 MIL/uL   Hemoglobin 11.3 (L) 12.0 -  16.0 g/dL   HCT 33.4 (L) 35.0 -  47.0 %   MCV 85.9 80.0 - 100.0 fL   MCH 29.2 26.0 - 34.0 pg   MCHC 33.9 32.0 - 36.0 g/dL   RDW 14.9 (H) 11.5 - 14.5 %   Platelets 39 (L) 150 - 440 K/uL   Neutrophils Relative % 57 %   Lymphocytes Relative 9 %   Monocytes Relative 34 %   Eosinophils Relative 0 %   Basophils Relative 0 %   Neutro Abs 7.4 (H) 1.4 - 6.5 K/uL   Lymphs Abs 1.2 1.0 - 3.6 K/uL   Monocytes Absolute 4.5 (H) 0.2 - 0.9 K/uL   Eosinophils Absolute 0.0 0 - 0.7 K/uL   Basophils Absolute 0.0 0 - 0.1 K/uL   RBC Morphology MIXED RBC POPULATION   Urinalysis, Complete w Microscopic     Status: Abnormal   Collection Time: 05/11/16  1:47 PM  Result Value Ref Range   Color, Urine YELLOW YELLOW   APPearance CLOUDY (A) CLEAR   Specific Gravity, Urine 1.006 1.005 - 1.030   pH 6.0 5.0 - 8.0   Glucose, UA NEGATIVE NEGATIVE mg/dL   Hgb urine dipstick MODERATE (A) NEGATIVE   Bilirubin Urine NEGATIVE NEGATIVE   Ketones, ur NEGATIVE NEGATIVE mg/dL   Protein, ur NEGATIVE NEGATIVE mg/dL   Nitrite POSITIVE (A) NEGATIVE   Leukocytes, UA LARGE (A) NEGATIVE   Squamous Epithelial / LPF 0-5 (A) NONE SEEN   WBC, UA TOO NUMEROUS TO COUNT 0 - 5 WBC/hpf   RBC / HPF 6-30 0 - 5 RBC/hpf   Bacteria, UA MANY (A) NONE SEEN   Mucous PRESENT   Influenza panel by PCR (type A & B, H1N1)     Status: None   Collection Time: 05/11/16  2:16 PM  Result Value Ref Range   Influenza A By PCR NEGATIVE NEGATIVE   Influenza B By PCR NEGATIVE NEGATIVE    Comment: (NOTE) The Xpert Xpress Flu assay is intended as an aid in the diagnosis of  influenza and should not be used as a sole basis for treatment.  This  assay is FDA approved for nasopharyngeal swab specimens only. Nasal  washings and aspirates are unacceptable for Xpert Xpress Flu testing.    No results found for this or any previous visit (from the past 240 hour(s)). Creatinine:  Recent Labs  05/07/16 1254 05/11/16 1305  CREATININE 1.14* 1.05*    Baseline Creatinine: 0.91 - 1.03  Impression/Assessment:   1. UTI with early sepsis (Fever to 101.30F, Leukocytosis (>12k), altered mentation, RR >22, P>90, mild AKI, worsening chronic thrombopcytopenia) -has been over one year since the last UTI with the current urologic management (chronic foley with q4week changes) -last foley change 05/07/2016, then was changed in the ER this afternoon, then removed at 5pm per Hospital Medicine  2. Chronic Urinary Retention of Uncertain Etiology -managed with a chronic foley following a failed voiding trial 02/07/2015 -foley removed at 5pm by [place], with 110m on bladder scan at 11pm (pt has not voided)  3. 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  Recommend:   1. Consider Meropenem if unimproved on Rocephin.  2. Closely monitor voiding efficiency, since the foley has been removed, as it will be difficult to clear a UTI if residual urine is present (I&O cath if no void by 8hrs = 1am, or if the BS PVR is >150) -check BS q6hrs, consider I&O q6hrs vs replacing the indwelling foley with PVR >1511mor if the pt is not  improving clinically (would have a low threshold for replacing the foley).  Dr. Junious Silk will be covering Urology beginning at 7am, 05/12/2016.  Time spent performing extensive chart review, personal review of imaging, and examining the pt: >120 minutes.  Darcella Cheshire 05/11/2016, 10:53 PM

## 2016-05-11 NOTE — ED Notes (Signed)
Pt's daughter to bedside at this time. Per pt's daughter pt fell at approx 3 am this morning, pt states she was unable to stand, pt's daughter reports having to call the fire department to get patient off the floor. Pt's daughter reports increasing confusion at this time. States the assist the floor previously documented in the triage note was from a prior encounter and that patient fell to the floor this morning. Pt continues to deny any injury or pain.

## 2016-05-11 NOTE — ED Triage Notes (Signed)
Pt presents to ED via [facility] from home. EMS reports generalized weakness since 12/5 that has not resolved. Per EMS pt was using the bathroom with caregiver when "her knees became weak", EMS reports that patient was assisted to the floor, denies injury, and denies any pain. On assessment pt is alert and oriented to self, place, and situation, pt is disoriented to time. Pt presents with a pacemaker to L chest but is unable to state why she has the pacemaker.

## 2016-05-11 NOTE — ED Notes (Signed)
Pt

## 2016-05-11 NOTE — Progress Notes (Signed)
This RN noticed pt's right leg was red, warm, and edematous. Writer notified MD, pt sent to ultrasound to rule out DVT. Ammie Dalton, RN

## 2016-05-12 LAB — COMPREHENSIVE METABOLIC PANEL
ALK PHOS: 54 U/L (ref 38–126)
ALT: 10 U/L — AB (ref 14–54)
AST: 16 U/L (ref 15–41)
Albumin: 2.8 g/dL — ABNORMAL LOW (ref 3.5–5.0)
Anion gap: 5 (ref 5–15)
BILIRUBIN TOTAL: 0.9 mg/dL (ref 0.3–1.2)
BUN: 10 mg/dL (ref 6–20)
CALCIUM: 8.4 mg/dL — AB (ref 8.9–10.3)
CO2: 22 mmol/L (ref 22–32)
CREATININE: 0.86 mg/dL (ref 0.44–1.00)
Chloride: 102 mmol/L (ref 101–111)
GFR calc Af Amer: >60 mL/min (ref >60)
GFR, EST NON AFRICAN AMERICAN: 57 mL/min — AB (ref >60)
Glucose, Bld: 114 mg/dL — ABNORMAL HIGH (ref 65–99)
POTASSIUM: 4 mmol/L (ref 3.5–5.1)
Sodium: 129 mmol/L — ABNORMAL LOW (ref 135–145)
TOTAL PROTEIN: 6.2 g/dL — AB (ref 6.5–8.1)

## 2016-05-12 LAB — CBC
HEMATOCRIT: 27.8 % — AB (ref 35.0–47.0)
Hemoglobin: 9.6 g/dL — ABNORMAL LOW (ref 12.0–16.0)
MCH: 30 pg (ref 26.0–34.0)
MCHC: 34.5 g/dL (ref 32.0–36.0)
MCV: 87 fL (ref 80.0–100.0)
PLATELETS: 37 10*3/uL — AB (ref 150–440)
RBC: 3.2 MIL/uL — ABNORMAL LOW (ref 3.80–5.20)
RDW: 14.7 % — AB (ref 11.5–14.5)
WBC: 11.4 10*3/uL — ABNORMAL HIGH (ref 3.6–11.0)

## 2016-05-12 NOTE — Progress Notes (Signed)
0055- Pt brief is dry, no spontaneous voiding. Bladder scan at 281 cc.  Straight cath with 275 cc yellow urine with small amount of sediment, no odor. Pt tolerated well. Dorna Bloom RN

## 2016-05-12 NOTE — Consult Note (Signed)
Case discussed with hospitalist, full consult to follow.  Patient with a history of CMML followed in clinic by Dr. Mike Gip. Her platelet count is significantly lower than her baseline, but do not recommend transfusion at this time unless patient has bleeding or requires a procedure.  Patient also found to have DVT. Would hold anticoagulation given her thrombocytopenia and consult vascular surgery for consideration of filter. If her platelet count returns to her baseline and is greater than 50,000, can then consider adding anticoagulation.

## 2016-05-12 NOTE — Care Management Important Message (Signed)
Important Message  Patient Details  Name: [name] MRN: [id number] Date of Birth: 01/12/1923   Medicare Important Message Given:  Yes    Daphyne Miguez A, RN 05/12/2016, 9:58 AM

## 2016-05-12 NOTE — Plan of Care (Signed)
Problem: Physical Regulation: Goal: Will remain free from infection Outcome: Progressing Pt receiving IV antibiotics  Problem: Tissue Perfusion: Goal: Risk factors for ineffective tissue perfusion will decrease Outcome: Not Progressing Pt has non-occlusive thrombus is right lower extremity.

## 2016-05-12 NOTE — Plan of Care (Signed)
Problem: Physical Regulation: Goal: Ability to maintain clinical measurements within normal limits will improve Outcome: Not Progressing Temp 101.8 at 1930.    Problem: Nutrition: Goal: Adequate nutrition will be maintained Outcome: Not Progressing Pt had bites of dinner

## 2016-05-12 NOTE — Consult Note (Signed)
Reason for Consult: Right POP V.- CFV DVT Referring Physician: Dr. Yevette Edwards Munford is an 82 y.o. female.  HPI: Patient with CMML, platelets of 39, admitted with UTI/sepsis. Found to have right Popliteal vein- Common Femoral vein DVT. Now requiring IVC filter placement.  Past Medical History:  Diagnosis Date  . Acquired complete AV block ([place]) 06/22/2012   Overview:  While on atenolol.  Again on diltiazem. Pacemaker placed 06/2012.   Marland Kitchen Anemia   . Arrhythmia   . Arthritis   . Atrial fibrillation ([place])   . BP (high blood pressure) 03/03/2015  . Complete atrioventricular block ([place]) 06/22/2012   Overview:  While on atenolol.  Again on diltiazem. Pacemaker placed 06/2012.   . Essential hypertension 03/01/2015  . HTN (hypertension)   . Hyperlipidemia   . Ischemic colitis ([place])   . Mobitz type 2 second degree heart block   . Pacemaker   . Stroke ([place])   . Supraventricular tachycardia ([place]) 06/22/2012   Overview:  Short bursts at rates up to 150bpm.   . Syncope   . Thrombocytopenia ([place])   . UTI (lower urinary tract infection) 01/12/2015  . Viral upper respiratory infection 06/14/2015    Past Surgical History:  Procedure Laterality Date  . CHOLECYSTECTOMY    . Left Hip Replacement     . PACEMAKER PLACEMENT      Family History  Problem Relation Age of Onset  . Stroke Mother   . Alcoholism Father   . Hypertension Sister   . Prostate cancer Neg Hx   . Bladder Cancer Neg Hx   . Kidney cancer Neg Hx     Social History:  reports that she has never smoked. She has never used smokeless tobacco. She reports that she drinks about 0.6 oz of alcohol per week . She reports that she does not use drugs.  Allergies:  Allergies  Allergen Reactions  . Atenolol Other (See Comments)    Reaction:  Complete Heart Block  . Penicillins Other (See Comments)    rash  . Dilaudid [Hydromorphone Hcl] Rash  . Hydromorphone Rash    Medications: I have reviewed the patient's current  medications.  Results for orders placed or performed during the hospital encounter of 05/11/16 (from the past 48 hour(s))  Comprehensive metabolic panel     Status: Abnormal   Collection Time: 05/11/16  1:05 PM  Result Value Ref Range   Sodium 125 (L) 135 - 145 mmol/L   Potassium 4.0 3.5 - 5.1 mmol/L   Chloride 94 (L) 101 - 111 mmol/L   CO2 23 22 - 32 mmol/L   Glucose, Bld 125 (H) 65 - 99 mg/dL   BUN 12 6 - 20 mg/dL   Creatinine, Ser 1.05 (H) 0.44 - 1.00 mg/dL   Calcium 9.1 8.9 - 10.3 mg/dL   Total Protein 7.2 6.5 - 8.1 g/dL   Albumin 3.4 (L) 3.5 - 5.0 g/dL   AST 19 15 - 41 U/L   ALT 11 (L) 14 - 54 U/L   Alkaline Phosphatase 61 38 - 126 U/L   Total Bilirubin 1.1 0.3 - 1.2 mg/dL   GFR calc non Af Amer 45 (L) >60 mL/min   GFR calc Af Amer 52 (L) >60 mL/min    Comment: (NOTE) The eGFR has been calculated using the CKD EPI equation. This calculation has not been validated in all clinical situations. eGFR's persistently <60 mL/min signify possible Chronic Kidney Disease.    Anion gap 8 5 - 15  Lactic acid, plasma     Status: None   Collection Time: 05/11/16  1:05 PM  Result Value Ref Range   Lactic Acid, Venous 0.9 0.5 - 1.9 mmol/L  CBC with Differential     Status: Abnormal   Collection Time: 05/11/16  1:05 PM  Result Value Ref Range   WBC 13.1 (H) 3.6 - 11.0 K/uL   RBC 3.89 3.80 - 5.20 MIL/uL   Hemoglobin 11.3 (L) 12.0 - 16.0 g/dL   HCT 33.4 (L) 35.0 - 47.0 %   MCV 85.9 80.0 - 100.0 fL   MCH 29.2 26.0 - 34.0 pg   MCHC 33.9 32.0 - 36.0 g/dL   RDW 14.9 (H) 11.5 - 14.5 %   Platelets 39 (L) 150 - 440 K/uL   Neutrophils Relative % 57 %   Lymphocytes Relative 9 %   Monocytes Relative 34 %   Eosinophils Relative 0 %   Basophils Relative 0 %   Neutro Abs 7.4 (H) 1.4 - 6.5 K/uL   Lymphs Abs 1.2 1.0 - 3.6 K/uL   Monocytes Absolute 4.5 (H) 0.2 - 0.9 K/uL   Eosinophils Absolute 0.0 0 - 0.7 K/uL   Basophils Absolute 0.0 0 - 0.1 K/uL   RBC Morphology MIXED RBC POPULATION    Culture, blood (routine x 2)     Status: None (Preliminary result)   Collection Time: 05/11/16  1:47 PM  Result Value Ref Range   Specimen Description BLOOD RIGHT FOREARM    Special Requests      BOTTLES DRAWN AEROBIC AND ANAEROBIC  AER 10CC ANA 11CC   Culture NO GROWTH < 24 HOURS    Report Status PENDING   Urinalysis, Complete w Microscopic     Status: Abnormal   Collection Time: 05/11/16  1:47 PM  Result Value Ref Range   Color, Urine YELLOW YELLOW   APPearance CLOUDY (A) CLEAR   Specific Gravity, Urine 1.006 1.005 - 1.030   pH 6.0 5.0 - 8.0   Glucose, UA NEGATIVE NEGATIVE mg/dL   Hgb urine dipstick MODERATE (A) NEGATIVE   Bilirubin Urine NEGATIVE NEGATIVE   Ketones, ur NEGATIVE NEGATIVE mg/dL   Protein, ur NEGATIVE NEGATIVE mg/dL   Nitrite POSITIVE (A) NEGATIVE   Leukocytes, UA LARGE (A) NEGATIVE   Squamous Epithelial / LPF 0-5 (A) NONE SEEN   WBC, UA TOO NUMEROUS TO COUNT 0 - 5 WBC/hpf   RBC / HPF 6-30 0 - 5 RBC/hpf   Bacteria, UA MANY (A) NONE SEEN   Mucous PRESENT   Culture, blood (routine x 2)     Status: None (Preliminary result)   Collection Time: 05/11/16  1:48 PM  Result Value Ref Range   Specimen Description BLOOD LEFT ANTECUBITAL    Special Requests      BOTTLES DRAWN AEROBIC AND ANAEROBIC  AER 6CC ANA 11CC   Culture NO GROWTH < 24 HOURS    Report Status PENDING   Influenza panel by PCR (type A & B, H1N1)     Status: None   Collection Time: 05/11/16  2:16 PM  Result Value Ref Range   Influenza A By PCR NEGATIVE NEGATIVE   Influenza B By PCR NEGATIVE NEGATIVE    Comment: (NOTE) The Xpert Xpress Flu assay is intended as an aid in the diagnosis of  influenza and should not be used as a sole basis for treatment.  This  assay is FDA approved for nasopharyngeal swab specimens only. Nasal  washings and aspirates are unacceptable for Xpert  Xpress Flu testing.   Comprehensive metabolic panel     Status: Abnormal   Collection Time: 05/12/16  5:51 AM  Result Value  Ref Range   Sodium 129 (L) 135 - 145 mmol/L   Potassium 4.0 3.5 - 5.1 mmol/L   Chloride 102 101 - 111 mmol/L   CO2 22 22 - 32 mmol/L   Glucose, Bld 114 (H) 65 - 99 mg/dL   BUN 10 6 - 20 mg/dL   Creatinine, Ser 0.86 0.44 - 1.00 mg/dL   Calcium 8.4 (L) 8.9 - 10.3 mg/dL   Total Protein 6.2 (L) 6.5 - 8.1 g/dL   Albumin 2.8 (L) 3.5 - 5.0 g/dL   AST 16 15 - 41 U/L   ALT 10 (L) 14 - 54 U/L   Alkaline Phosphatase 54 38 - 126 U/L   Total Bilirubin 0.9 0.3 - 1.2 mg/dL   GFR calc non Af Amer 57 (L) >60 mL/min   GFR calc Af Amer >60 >60 mL/min    Comment: (NOTE) The eGFR has been calculated using the CKD EPI equation. This calculation has not been validated in all clinical situations. eGFR's persistently <60 mL/min signify possible Chronic Kidney Disease.    Anion gap 5 5 - 15  CBC     Status: Abnormal   Collection Time: 05/12/16  5:51 AM  Result Value Ref Range   WBC 11.4 (H) 3.6 - 11.0 K/uL   RBC 3.20 (L) 3.80 - 5.20 MIL/uL   Hemoglobin 9.6 (L) 12.0 - 16.0 g/dL   HCT 27.8 (L) 35.0 - 47.0 %   MCV 87.0 80.0 - 100.0 fL   MCH 30.0 26.0 - 34.0 pg   MCHC 34.5 32.0 - 36.0 g/dL   RDW 14.7 (H) 11.5 - 14.5 %   Platelets 37 (L) 150 - 440 K/uL    Dg Chest 2 View  Result Date: 05/11/2016 CLINICAL DATA:  Cough, SOB non smoker EXAM: CHEST  2 VIEW COMPARISON:  01/31/2015 FINDINGS: Left-sided transvenous pacemaker leads to the right atrium and right ventricle. The heart is enlarged. There are no focal consolidations or pleural effusions. Mildly prominent interstitial marking are consistent with mild interstitial pulmonary edema. IMPRESSION: Cardiomegaly and mild interstitial edema. Electronically Signed   By: Nolon Nations M.D.   On: 05/11/2016 14:07   US Venous Img Lower Unilateral Right  Result Date: 05/11/2016 CLINICAL DATA:  Right lower extremity swelling EXAM: Right LOWER EXTREMITY VENOUS DOPPLER ULTRASOUND TECHNIQUE: Gray-scale sonography with graded compression, as well as color Doppler  and duplex ultrasound were performed to evaluate the lower extremity deep venous systems from the level of the common femoral vein and including the common femoral, femoral, profunda femoral, popliteal and calf veins including the posterior tibial, peroneal and gastrocnemius veins when visible. The superficial great saphenous vein was also interrogated. Spectral Doppler was utilized to evaluate flow at rest and with distal augmentation maneuvers in the common femoral, femoral and popliteal veins. COMPARISON:  None. FINDINGS: Contralateral Common Femoral Vein: Respiratory phasicity is normal and symmetric with the symptomatic side. No evidence of thrombus. Normal compressibility. Common Femoral Vein: Nonocclusive thrombus is noted Saphenofemoral Junction: Nonocclusive thrombus is noted. Profunda Femoral Vein: Nonocclusive thrombus is noted. Femoral Vein: Nonocclusive thrombus is noted. Popliteal Vein: Nonocclusive thrombus is noted. Calf Veins: No evidence of thrombus. Normal compressibility and flow on color Doppler imaging. Superficial Great Saphenous Vein: No evidence of thrombus. Normal compressibility and flow on color Doppler imaging. Venous Reflux:  None. Other Findings:  None. IMPRESSION:  Nonocclusive thrombus is noted within venous structures of the right leg extending from the popliteal vein to the level of the common femoral vein. Electronically Signed   By: Inez Catalina M.D.   On: 05/11/2016 20:21    Review of Systems  Constitutional: Positive for chills, fever and malaise/fatigue. Negative for diaphoresis and weight loss.  HENT: Negative.   Eyes: Negative.   Respiratory: Negative.   Cardiovascular: Positive for leg swelling. Negative for chest pain and palpitations.  Gastrointestinal: Negative.   Genitourinary: Positive for dysuria.  Musculoskeletal: Negative.   Skin: Negative.   Neurological: Positive for weakness.  Psychiatric/Behavioral: Negative.    Blood pressure 134/61, pulse 92,  temperature 98.3 F (36.8 C), temperature source Oral, resp. rate 18, height '5\' 3"'$  (1.6 m), weight 56.7 kg (125 lb 1 oz), SpO2 96 %. Physical Exam  Constitutional: She is oriented to person, place, and time. She appears well-developed and well-nourished. No distress.  HENT:  Head: Normocephalic.  Neck: Normal range of motion. No JVD present.  Cardiovascular: Normal rate, regular rhythm, normal heart sounds and intact distal pulses.   No murmur heard. Respiratory: Effort normal and breath sounds normal.  GI: Soft. She exhibits no distension. There is no tenderness.  Musculoskeletal: She exhibits edema.  Right lower extremity edema, erythema, non tender  Neurological: She is alert and oriented to person, place, and time.  Skin: Skin is warm. There is erythema.    Assessment/Plan: Extensive right lower extremity DVT- CFV- POP V.  Recommend IVC Filter placement as patient cannot be anticoagulated secondary to CMML and Platelets of 39.  I discussed the findings of DVT with the patient and the recommended treatment of IVC filter placement. She asked that I discuss this further with her daughter Tomi Bamberger. I had an extensive discussion with Tomi Bamberger including risks, benefits and alternatives to IVC filter placement. I emphasized the risk of PE without intervention. She stated she wanted to think about it and discuss it further with her sister and mother.  Once patient and family decide we will proceed with IVC filter placement.  Miamarie Moll A 05/12/2016, 1:30 PM

## 2016-05-12 NOTE — Progress Notes (Signed)
PT Cancellation Note  Patient Details Name: [name] MRN: [id number] DOB: 11/16/1922   Cancelled Treatment:    Reason Eval/Treat Not Completed: Medical issues which prohibited therapy. Upon chart review, PT spoke with Maddy (RN) prior to commencing evaluation. Noted that patient has R LE DVT and has not yet begun anti-coagulant therapy. Will check back later if time permits.   Dorice Lamas, PT, DPT 05/12/2016, 10:41 AM

## 2016-05-12 NOTE — Progress Notes (Signed)
[facility] [place] NAME: [name]    MR#:  [id number]  DATE OF BIRTH:  06/27/1922  SUBJECTIVE:  CHIEF COMPLAINT:   Chief Complaint  Patient presents with  . Weakness  . Fall  . Altered Mental Status  Chronic Foley for urinary retention and recurrent UTI in the past. Brought with altered mental status and UTI. Foley removed in ER. She is having urinary retention and needing in and out catheterization every 6 hour as per the night nurse. Also had complain of swelling on her right leg, Doppler study is positive for nonocclusive DVT. Platelet count is 37,000, no active bleeding. Patient has hearing deficit but she offers no complaint.  REVIEW OF SYSTEMS:  CONSTITUTIONAL: No fever, fatigue or weakness.  EYES: No blurred or double vision.  EARS, NOSE, AND THROAT: No tinnitus or ear pain.  RESPIRATORY: No cough, shortness of breath, wheezing or hemoptysis.  CARDIOVASCULAR: No chest pain, orthopnea, edema.  GASTROINTESTINAL: No nausea, vomiting, diarrhea or abdominal pain.  GENITOURINARY: No dysuria, hematuria.  ENDOCRINE: No polyuria, nocturia,  HEMATOLOGY: No anemia, easy bruising or bleeding SKIN: No rash or lesion. MUSCULOSKELETAL: No joint pain or arthritis.   NEUROLOGIC: No tingling, numbness, weakness.  PSYCHIATRY: No anxiety or depression.   ROS  DRUG ALLERGIES:   Allergies  Allergen Reactions  . Atenolol Other (See Comments)    Reaction:  Complete Heart Block  . Penicillins Other (See Comments)    rash  . Dilaudid [Hydromorphone Hcl] Rash  . Hydromorphone Rash    VITALS:  Blood pressure (!) 121/46, pulse 87, temperature 99.5 F (37.5 C), temperature source Oral, resp. rate 18, height 5\' 3"  (1.6 m), weight 56.7 kg (125 lb 1 oz), SpO2 98 %.  PHYSICAL EXAMINATION:  GENERAL:  82 y.o.-year-old patient lying in the bed with no acute distress.  EYES: Pupils equal, round, reactive to light and accommodation. No scleral  icterus. Extraocular muscles intact.  HEENT: Head atraumatic, normocephalic. Oropharynx and nasopharynx clear.  NECK:  Supple, no jugular venous distention. No thyroid enlargement, no tenderness.  LUNGS: Normal breath sounds bilaterally, no wheezing, rales,rhonchi or crepitation. No use of accessory muscles of respiration.  CARDIOVASCULAR: S1, S2 normal. No murmurs, rubs, or gallops.  ABDOMEN: Soft, nontender, nondistended. Bowel sounds present. No organomegaly or mass.  EXTREMITIES: No pedal edema, cyanosis, or clubbing.  NEUROLOGIC: Cranial nerves II through XII are intact. Muscle strength 4/5 in upper extremities, 2 out of 5 in lower extremities. Sensation intact. Gait not checked.  PSYCHIATRIC: The patient is alert and oriented x 2.  SKIN: No obvious rash, lesion, or ulcer.   Physical Exam LABORATORY PANEL:   CBC  Recent Labs Lab 05/12/16 0551  WBC 11.4*  HGB 9.6*  HCT 27.8*  PLT 37*   ------------------------------------------------------------------------------------------------------------------  Chemistries   Recent Labs Lab 05/12/16 0551  NA 129*  K 4.0  CL 102  CO2 22  GLUCOSE 114*  BUN 10  CREATININE 0.86  CALCIUM 8.4*  AST 16  ALT 10*  ALKPHOS 54  BILITOT 0.9   ------------------------------------------------------------------------------------------------------------------  Cardiac Enzymes No results for input(s): TROPONINI in the last 168 hours. ------------------------------------------------------------------------------------------------------------------  RADIOLOGY:  Dg Chest 2 View  Result Date: 05/11/2016 CLINICAL DATA:  Cough, SOB non smoker EXAM: CHEST  2 VIEW COMPARISON:  01/31/2015 FINDINGS: Left-sided transvenous pacemaker leads to the right atrium and right ventricle. The heart is enlarged. There are no focal consolidations or pleural effusions. Mildly prominent interstitial marking are consistent  with mild interstitial pulmonary edema.  IMPRESSION: Cardiomegaly and mild interstitial edema. Electronically Signed   By: Nolon Nations M.D.   On: 05/11/2016 14:07   US Venous Img Lower Unilateral Right  Result Date: 05/11/2016 CLINICAL DATA:  Right lower extremity swelling EXAM: Right LOWER EXTREMITY VENOUS DOPPLER ULTRASOUND TECHNIQUE: Gray-scale sonography with graded compression, as well as color Doppler and duplex ultrasound were performed to evaluate the lower extremity deep venous systems from the level of the common femoral vein and including the common femoral, femoral, profunda femoral, popliteal and calf veins including the posterior tibial, peroneal and gastrocnemius veins when visible. The superficial great saphenous vein was also interrogated. Spectral Doppler was utilized to evaluate flow at rest and with distal augmentation maneuvers in the common femoral, femoral and popliteal veins. COMPARISON:  None. FINDINGS: Contralateral Common Femoral Vein: Respiratory phasicity is normal and symmetric with the symptomatic side. No evidence of thrombus. Normal compressibility. Common Femoral Vein: Nonocclusive thrombus is noted Saphenofemoral Junction: Nonocclusive thrombus is noted. Profunda Femoral Vein: Nonocclusive thrombus is noted. Femoral Vein: Nonocclusive thrombus is noted. Popliteal Vein: Nonocclusive thrombus is noted. Calf Veins: No evidence of thrombus. Normal compressibility and flow on color Doppler imaging. Superficial Great Saphenous Vein: No evidence of thrombus. Normal compressibility and flow on color Doppler imaging. Venous Reflux:  None. Other Findings:  None. IMPRESSION: Nonocclusive thrombus is noted within venous structures of the right leg extending from the popliteal vein to the level of the common femoral vein. Electronically Signed   By: Inez Catalina M.D.   On: 05/11/2016 20:21    ASSESSMENT AND PLAN:   Principal Problem:   UTI (urinary tract infection) Active Problems:   Urinary retention    Encephalopathy acute   Generalized weakness  * Altered mental status secondary to metabolic encephalopathy secondary to urinary tract infection   Improved now.  * Urinary tract infection   Follow urine culture, IV ceftriaxone for now.  * Chronic urinary retention, chronic Foley catheter.   Catheter is removed by ER physician. Urology saw the patient. Patient still have complain of retention and requiring in and out catheter in hospital, we may consider replacing the catheter before discharge.  * Nonocclusive deep vein thrombosis   She is not a candidate for chemical anticoagulation because of low platelet count, called oncology and vascular consult. She may require filter.  * History of leukemia, thrombocytopenia and anemia   As per patient's daughter she is not on any chemotherapy but just on the regular checkup in maintenance at [facility].   Called oncology consult is patient's platelets are low with acute DVT.    All the records are reviewed and case discussed with Care Management/Social Workerr. Management plans discussed with the patient, family and they are in agreement.  CODE STATUS: Full  TOTAL TIME TAKING CARE OF THIS PATIENT: 40 minutes.  Case discussed with patient's daughter was present in the room and with oncologist and vascular surgeon on phone.   POSSIBLE D/C IN 2-3 DAYS, DEPENDING ON CLINICAL CONDITION.   Vaughan Basta M.D on 05/12/2016   Between 7am to 6pm - Pager - [phone]  After 6pm go to [email or web address] - password [email or web address] [facility]  Office  [phone]  CC: Primary care physician; Tommi Rumps, MD  Note: This dictation was prepared with Dragon dictation along with smaller phrase technology. Any transcriptional errors that result from this process are unintentional.

## 2016-05-13 ENCOUNTER — Encounter
Admission: EM | Disposition: A | Discharge status: Skilled Nursing Facility | Payer: Self-pay | Source: Home / Self Care | Attending: Internal Medicine

## 2016-05-13 DIAGNOSIS — N390 Urinary tract infection, site not specified: Secondary | ICD-10-CM

## 2016-05-13 DIAGNOSIS — I82431 Acute embolism and thrombosis of right popliteal vein: Secondary | ICD-10-CM

## 2016-05-13 DIAGNOSIS — D696 Thrombocytopenia, unspecified: Secondary | ICD-10-CM

## 2016-05-13 DIAGNOSIS — I82401 Acute embolism and thrombosis of unspecified deep veins of right lower extremity: Secondary | ICD-10-CM

## 2016-05-13 DIAGNOSIS — D72821 Monocytosis (symptomatic): Secondary | ICD-10-CM

## 2016-05-13 DIAGNOSIS — M1990 Unspecified osteoarthritis, unspecified site: Secondary | ICD-10-CM

## 2016-05-13 DIAGNOSIS — Z79899 Other long term (current) drug therapy: Secondary | ICD-10-CM

## 2016-05-13 DIAGNOSIS — Z87820 Personal history of traumatic brain injury: Secondary | ICD-10-CM

## 2016-05-13 DIAGNOSIS — E785 Hyperlipidemia, unspecified: Secondary | ICD-10-CM

## 2016-05-13 DIAGNOSIS — R338 Other retention of urine: Secondary | ICD-10-CM

## 2016-05-13 DIAGNOSIS — Z950 Presence of cardiac pacemaker: Secondary | ICD-10-CM

## 2016-05-13 DIAGNOSIS — I10 Essential (primary) hypertension: Secondary | ICD-10-CM

## 2016-05-13 DIAGNOSIS — T83511A Infection and inflammatory reaction due to indwelling urethral catheter, initial encounter: Principal | ICD-10-CM

## 2016-05-13 HISTORY — PX: PERIPHERAL VASCULAR CATHETERIZATION: SHX172C

## 2016-05-13 LAB — CBC
HEMATOCRIT: 27.8 % — AB (ref 35.0–47.0)
HEMOGLOBIN: 9.5 g/dL — AB (ref 12.0–16.0)
MCH: 29.8 pg (ref 26.0–34.0)
MCHC: 34.2 g/dL (ref 32.0–36.0)
MCV: 87.2 fL (ref 80.0–100.0)
Platelets: 37 10*3/uL — ABNORMAL LOW (ref 150–440)
RBC: 3.19 MIL/uL — ABNORMAL LOW (ref 3.80–5.20)
RDW: 14.7 % — ABNORMAL HIGH (ref 11.5–14.5)
WBC: 11.9 10*3/uL — ABNORMAL HIGH (ref 3.6–11.0)

## 2016-05-13 SURGERY — IVC FILTER INSERTION
Anesthesia: Moderate Sedation

## 2016-05-13 MED ORDER — MIDAZOLAM HCL 2 MG/2ML IJ SOLN
INTRAMUSCULAR | Status: DC | PRN
  Administered 2016-05-13: 1 mg via INTRAVENOUS

## 2016-05-13 MED ORDER — FENTANYL CITRATE (PF) 100 MCG/2ML IJ SOLN
INTRAMUSCULAR | Status: AC
  Filled 2016-05-13: qty 2

## 2016-05-13 MED ORDER — LIDOCAINE-EPINEPHRINE 1 %-1:100000 IJ SOLN
INTRAMUSCULAR | Status: AC
Start: 2016-05-13 — End: 2016-05-13
  Filled 2016-05-13: qty 1

## 2016-05-13 MED ORDER — IOPAMIDOL (ISOVUE-300) INJECTION 61%
INTRAVENOUS | Status: DC | PRN
  Administered 2016-05-13: 20 mL via INTRAVENOUS

## 2016-05-13 MED ORDER — MIDAZOLAM HCL 2 MG/2ML IJ SOLN
INTRAMUSCULAR | Status: AC
  Filled 2016-05-13: qty 2

## 2016-05-13 MED ORDER — HEPARIN (PORCINE) IN NACL 2-0.9 UNIT/ML-% IJ SOLN
INTRAMUSCULAR | Status: AC
  Filled 2016-05-13: qty 500

## 2016-05-13 MED ORDER — FENTANYL CITRATE (PF) 100 MCG/2ML IJ SOLN
INTRAMUSCULAR | Status: DC | PRN
  Administered 2016-05-13: 25 ug via INTRAVENOUS

## 2016-05-13 MED ORDER — CLINDAMYCIN PHOSPHATE 300 MG/50ML IV SOLN
INTRAVENOUS | Status: AC
  Administered 2016-05-13: 11:00:00
  Filled 2016-05-13: qty 50

## 2016-05-13 SURGICAL SUPPLY — 7 items
CATH TORCON 5FR 0.38 (CATHETERS) ×3 IMPLANT
DEVICE TORQUE (MISCELLANEOUS) ×3 IMPLANT
FILTER VC CELECT-FEMORAL (Filter) ×3 IMPLANT
GLIDEWIRE STIFF .35X180X3 HYDR (WIRE) ×3 IMPLANT
PACK ANGIOGRAPHY (CUSTOM PROCEDURE TRAY) ×3 IMPLANT
SHEATH BRITE TIP 5FRX11 (SHEATH) ×3 IMPLANT
WIRE J 3MM .035X145CM (WIRE) ×3 IMPLANT

## 2016-05-13 NOTE — Op Note (Signed)
[facility]   OPERATIVE NOTE    PRE-OPERATIVE DIAGNOSIS: RLE DVT and severe thrombocytopenia precluding anticoagulation  POST-OPERATIVE DIAGNOSIS: same as above  PROCEDURE: 1.   Ultrasound guidance for vascular access to the left vein 2.   Catheter placement into the inferior vena cava 3.   Inferior venacavogram 4.   Placement of a cook celect IVC filter  SURGEON: Leotis Pain, MD  ASSISTANT(S): None  ANESTHESIA: local with Moderate Conscious Sedation for approximately 15 minutes using 1 mg of Versed and 25 mcg of Fentanyl  ESTIMATED BLOOD LOSS: minimal  CONTRAST: 30 cc  FLUORO TIME: 3.1 minutes  FINDING(S): 1.  Patent IVC, patent left iliac and femoral veins, but vessels were markedly tortuous  SPECIMEN(S):  none  INDICATIONS:   [name] is a 82 y.o. female who presents with RLE DVT and a platelet count of around 30,000.  Inferior vena cava filter is indicated for this reason.  Risks and benefits including filter thrombosis, migration, fracture, bleeding, and infection were all discussed.  We discussed that all IVC filters that we place can be removed if desired from the patient once the need for the filter has passed.    DESCRIPTION: After obtaining full informed written consent, the patient was brought back to the vascular suite. The skin was sterilely prepped and draped in a sterile surgical field was created. Moderate conscious sedation was administered during a face to face encounter with the patient throughout the procedure with my supervision of the RN administering medicines and monitoring the patient's vital signs, pulse oximetry, telemetry and mental status throughout from the start of the procedure until the patient was taken to the recovery room. The left femoral vein was accessed under direct ultrasound guidance without difficulty with a Seldinger needle and a J-wire was then placed. The wire would not easily cross, so I placed a 5 French  sheath and imaging was performed from the left femoral vein up through the inferior vena cava. There were no obvious stenoses or thromboses in the left femoral vein, iliac vein, or inferior vena cava but the vessels were quite tortuous. With a Glidewire and a Kumpe catheter and was able to then easily advanced into the inferior vena cava. After skin nick and dilatation, the delivery sheath was placed into the inferior vena cava and an inferior venacavogram was performed. This demonstrated a patent IVC with the level of the renal veins at L2.  The filter was then deployed into the inferior vena cava at the level of the L2-L3 interspace just below the renal veins. The delivery sheath was then removed. Pressure was held. Sterile dressings were placed. The patient tolerated the procedure well and was taken to the recovery room in stable condition.  COMPLICATIONS: None  CONDITION: Stable  Leotis Pain  05/13/2016, 11:32 AM   This note was created with Dragon Medical transcription system. Any errors in dictation are purely unintentional.

## 2016-05-13 NOTE — Progress Notes (Signed)
No visible bleeding nor hematoma at right groin site, report called to [facility] RN on 1c with plan reveiwed.

## 2016-05-13 NOTE — H&P (Signed)
[facility] History & Physical Update  The patient was interviewed and re-examined.  The patient's previous History and Physical has been reviewed and is unchanged.  There is no change in the plan of care. We plan to proceed with the scheduled procedure.  Leotis Pain, MD  05/13/2016, 10:44 AM

## 2016-05-13 NOTE — Progress Notes (Signed)
PT Cancellation Note  Patient Details Name: [name] MRN: [id number] DOB: Nov 09, 1922   Cancelled Treatment:    Reason Eval/Treat Not Completed: Medical issues which prohibited therapy. Pt with positive R LE DVT and is scheduled for IVC filter this date as she is not a candidate for chemical anticoagulation. Will complete orders at this time, need new orders for resumption of PT services post procedure.   Maurita Havener 05/13/2016, 9:14 AM Greggory Stallion, PT, DPT [phone]

## 2016-05-13 NOTE — Progress Notes (Signed)
  Pt feeling better. Known to our office with h/o urinary retention managed with a  Foley and occasional UTI. Now on CIC.   O: Vitals:   05/13/16 1200 05/13/16 1216  BP: (!) 102/47 (!) 113/45  Pulse: 67 73  Resp: (!) 26 20  Temp:  98.3 F (36.8 C)    Intake/Output Summary (Last 24 hours) at 05/13/16 1645 Last data filed at 05/13/16 1500  Gross per 24 hour  Intake          2778.33 ml  Output             1575 ml  Net          1203.33 ml    NAD A&Ox3 Abd - soft, NT   A/P - -UTI - on rocephin per primary team. Urine Cx reincubated for better growth. She's been on abx a few days and her bladder will never be "sterile" with a foley or CIC.    -retention - continue CIC, OK to replace foley catheter prior to d/c.   Will sign OFF, but please page GU with any questions or concerns.

## 2016-05-13 NOTE — Progress Notes (Signed)
Pt clinically stable post ivc filter placement. Dr Lucky Cowboy out to speak with patient and family with questions answered. Paced rhythm. Daughters at bedside. Right groin without bleeding nor hematoma.

## 2016-05-13 NOTE — Progress Notes (Signed)
[place] [place] NAME: [name]    MR#:  [id number]  DATE OF BIRTH:  1922/08/29  SUBJECTIVE:  CHIEF COMPLAINT:   Chief Complaint  Patient presents with  . Weakness  . Fall  . Altered Mental Status  Chronic Foley for urinary retention and recurrent UTI in the past. Brought with altered mental status and UTI. Foley removed in ER. She is having urinary retention and needing in and out catheterization every 6 hour as per the night nurse. Also had complain of swelling on her right leg, Doppler study is positive for nonocclusive DVT. Platelet count is 37,000, no active bleeding. Patient has hearing deficit but she offers no complaint.  sent today for IVC filter placement.  REVIEW OF SYSTEMS:  CONSTITUTIONAL: No fever, fatigue or weakness.  EYES: No blurred or double vision.  EARS, NOSE, AND THROAT: No tinnitus or ear pain.  RESPIRATORY: No cough, shortness of breath, wheezing or hemoptysis.  CARDIOVASCULAR: No chest pain, orthopnea, edema.  GASTROINTESTINAL: No nausea, vomiting, diarrhea or abdominal pain.  GENITOURINARY: No dysuria, hematuria.  ENDOCRINE: No polyuria, nocturia,  HEMATOLOGY: No anemia, easy bruising or bleeding SKIN: No rash or lesion. MUSCULOSKELETAL: No joint pain or arthritis.   NEUROLOGIC: No tingling, numbness, weakness.  PSYCHIATRY: No anxiety or depression.   ROS  DRUG ALLERGIES:   Allergies  Allergen Reactions  . Atenolol Other (See Comments)    Reaction:  Complete Heart Block  . Penicillins Other (See Comments)    rash  . Dilaudid [Hydromorphone Hcl] Rash  . Hydromorphone Rash    VITALS:  Blood pressure (!) 113/45, pulse 73, temperature 98.3 F (36.8 C), temperature source Oral, resp. rate 20, height 5\' 3"  (1.6 m), weight 55.7 kg (122 lb 14.4 oz), SpO2 95 %.  PHYSICAL EXAMINATION:  GENERAL:  82 y.o.-year-old patient lying in the bed with no acute distress.  EYES: Pupils equal, round, reactive to  light and accommodation. No scleral icterus. Extraocular muscles intact.  HEENT: Head atraumatic, normocephalic. Oropharynx and nasopharynx clear.  NECK:  Supple, no jugular venous distention. No thyroid enlargement, no tenderness.  LUNGS: Normal breath sounds bilaterally, no wheezing, rales,rhonchi or crepitation. No use of accessory muscles of respiration.  CARDIOVASCULAR: S1, S2 normal. No murmurs, rubs, or gallops.  ABDOMEN: Soft, nontender, nondistended. Bowel sounds present. No organomegaly or mass.  EXTREMITIES: No pedal edema, cyanosis, or clubbing.  NEUROLOGIC: Cranial nerves II through XII are intact. Muscle strength 4/5 in upper extremities, 2 out of 5 in lower extremities. Sensation intact. Gait not checked.  PSYCHIATRIC: The patient is alert and oriented x 2.  SKIN: No obvious rash, lesion, or ulcer.   Physical Exam LABORATORY PANEL:   CBC  Recent Labs Lab 05/13/16 0420  WBC 11.9*  HGB 9.5*  HCT 27.8*  PLT 37*   ------------------------------------------------------------------------------------------------------------------  Chemistries   Recent Labs Lab 05/12/16 0551  NA 129*  K 4.0  CL 102  CO2 22  GLUCOSE 114*  BUN 10  CREATININE 0.86  CALCIUM 8.4*  AST 16  ALT 10*  ALKPHOS 54  BILITOT 0.9   ------------------------------------------------------------------------------------------------------------------  Cardiac Enzymes No results for input(s): TROPONINI in the last 168 hours. ------------------------------------------------------------------------------------------------------------------  RADIOLOGY:  US Venous Img Lower Unilateral Right  Result Date: 05/11/2016 CLINICAL DATA:  Right lower extremity swelling EXAM: Right LOWER EXTREMITY VENOUS DOPPLER ULTRASOUND TECHNIQUE: Gray-scale sonography with graded compression, as well as color Doppler and duplex ultrasound were performed to evaluate the lower extremity deep  venous systems from the level  of the common femoral vein and including the common femoral, femoral, profunda femoral, popliteal and calf veins including the posterior tibial, peroneal and gastrocnemius veins when visible. The superficial great saphenous vein was also interrogated. Spectral Doppler was utilized to evaluate flow at rest and with distal augmentation maneuvers in the common femoral, femoral and popliteal veins. COMPARISON:  None. FINDINGS: Contralateral Common Femoral Vein: Respiratory phasicity is normal and symmetric with the symptomatic side. No evidence of thrombus. Normal compressibility. Common Femoral Vein: Nonocclusive thrombus is noted Saphenofemoral Junction: Nonocclusive thrombus is noted. Profunda Femoral Vein: Nonocclusive thrombus is noted. Femoral Vein: Nonocclusive thrombus is noted. Popliteal Vein: Nonocclusive thrombus is noted. Calf Veins: No evidence of thrombus. Normal compressibility and flow on color Doppler imaging. Superficial Great Saphenous Vein: No evidence of thrombus. Normal compressibility and flow on color Doppler imaging. Venous Reflux:  None. Other Findings:  None. IMPRESSION: Nonocclusive thrombus is noted within venous structures of the right leg extending from the popliteal vein to the level of the common femoral vein. Electronically Signed   By: Inez Catalina M.D.   On: 05/11/2016 20:21    ASSESSMENT AND PLAN:   Principal Problem:   UTI (urinary tract infection) Active Problems:   Urinary retention   Encephalopathy acute   Generalized weakness  * Altered mental status secondary to metabolic encephalopathy secondary to urinary tract infection   Improved now. Back to baseline.  * Urinary tract infection   Follow urine culture, IV ceftriaxone for now.  * Chronic urinary retention, chronic Foley catheter.   Catheter is removed by ER physician. Urology saw the patient. Patient still have complain of retention and requiring in and out catheter in hospital, we may consider replacing  the catheter before discharge.  * Nonocclusive deep vein thrombosis   She is not a candidate for chemical anticoagulation because of low platelet count, called oncology and vascular consult.    S/p IVC filter 05/13/16.  * History of leukemia, thrombocytopenia and anemia   As per patient's daughter she is not on any chemotherapy but just on the regular checkup in maintenance at [place].   Called oncology consult is patient's platelets are low with acute DVT.  * hypertension   Stable on current meds.  All the records are reviewed and case discussed with Care Management/Social Workerr. Management plans discussed with the patient, family and they are in agreement.  CODE STATUS: Full  TOTAL TIME TAKING CARE OF THIS PATIENT: 40 minutes.  Case discussed with patient's daughter was present in the room and with oncologist and vascular surgeon on phone.   POSSIBLE D/C IN 2-3 DAYS, DEPENDING ON CLINICAL CONDITION.   Vaughan Basta M.D on 05/13/2016   Between 7am to 6pm - Pager - [phone]  After 6pm go to [email or web address] - password [email or web address] [facility]  Office  [phone]  CC: Primary care physician; Tommi Rumps, MD  Note: This dictation was prepared with Dragon dictation along with smaller phrase technology. Any transcriptional errors that result from this process are unintentional.

## 2016-05-13 NOTE — Care Management (Signed)
Admitted to [facility] with the diagnosis of urinary tract infection. Lives with daughter, Martiza Held [phone]). Last seen Dr. Biagio Quint a month ago. Home Health per [facility] in the past., No skilled facility. No home oxygen. Rolling walker, rollayor, and raised toilet seat in the home. Nursing assistant from [facility] on Tuesday and Thursday from 10:00am - 4:00pm. Self feed, needs help with baths and dressing. Fell last Saturday morning. Good-Fair appetite. Prescriptions are filled at [facility] on [address]. Daughter will transport, if mother is able to ambulate. IVF placed today. Shelbie Ammons RN MSN CCM Care Management

## 2016-05-14 ENCOUNTER — Encounter
Admission: RE | Admit: 2016-05-14 | Discharge: 2016-05-14 | Disposition: A | Discharge status: Home / Self Care | Payer: Medicare Other | Source: Ambulatory Visit | Attending: Internal Medicine | Admitting: Internal Medicine

## 2016-05-14 ENCOUNTER — Encounter: Payer: Self-pay | Admitting: Vascular Surgery

## 2016-05-14 DIAGNOSIS — Z8673 Personal history of transient ischemic attack (TIA), and cerebral infarction without residual deficits: Secondary | ICD-10-CM | POA: Diagnosis not present

## 2016-05-14 DIAGNOSIS — I82519 Chronic embolism and thrombosis of unspecified femoral vein: Secondary | ICD-10-CM | POA: Diagnosis not present

## 2016-05-14 DIAGNOSIS — K7291 Hepatic failure, unspecified with coma: Secondary | ICD-10-CM | POA: Diagnosis not present

## 2016-05-14 DIAGNOSIS — R6889 Other general symptoms and signs: Secondary | ICD-10-CM | POA: Diagnosis not present

## 2016-05-14 DIAGNOSIS — N390 Urinary tract infection, site not specified: Secondary | ICD-10-CM | POA: Diagnosis not present

## 2016-05-14 DIAGNOSIS — Z96642 Presence of left artificial hip joint: Secondary | ICD-10-CM | POA: Diagnosis not present

## 2016-05-14 DIAGNOSIS — M6281 Muscle weakness (generalized): Secondary | ICD-10-CM | POA: Diagnosis not present

## 2016-05-14 DIAGNOSIS — Z743 Need for continuous supervision: Secondary | ICD-10-CM | POA: Diagnosis not present

## 2016-05-14 DIAGNOSIS — L538 Other specified erythematous conditions: Secondary | ICD-10-CM | POA: Diagnosis not present

## 2016-05-14 DIAGNOSIS — I10 Essential (primary) hypertension: Secondary | ICD-10-CM | POA: Diagnosis not present

## 2016-05-14 DIAGNOSIS — B9620 Unspecified Escherichia coli [E. coli] as the cause of diseases classified elsewhere: Secondary | ICD-10-CM | POA: Diagnosis not present

## 2016-05-14 DIAGNOSIS — I82409 Acute embolism and thrombosis of unspecified deep veins of unspecified lower extremity: Secondary | ICD-10-CM | POA: Diagnosis not present

## 2016-05-14 DIAGNOSIS — Z950 Presence of cardiac pacemaker: Secondary | ICD-10-CM | POA: Diagnosis not present

## 2016-05-14 DIAGNOSIS — I442 Atrioventricular block, complete: Secondary | ICD-10-CM | POA: Diagnosis not present

## 2016-05-14 DIAGNOSIS — D72821 Monocytosis (symptomatic): Secondary | ICD-10-CM | POA: Diagnosis not present

## 2016-05-14 DIAGNOSIS — N319 Neuromuscular dysfunction of bladder, unspecified: Secondary | ICD-10-CM | POA: Diagnosis not present

## 2016-05-14 DIAGNOSIS — C9310 Chronic myelomonocytic leukemia not having achieved remission: Secondary | ICD-10-CM | POA: Diagnosis not present

## 2016-05-14 DIAGNOSIS — C9100 Acute lymphoblastic leukemia not having achieved remission: Secondary | ICD-10-CM | POA: Diagnosis not present

## 2016-05-14 DIAGNOSIS — Z466 Encounter for fitting and adjustment of urinary device: Secondary | ICD-10-CM | POA: Diagnosis not present

## 2016-05-14 DIAGNOSIS — Z79899 Other long term (current) drug therapy: Secondary | ICD-10-CM | POA: Diagnosis not present

## 2016-05-14 DIAGNOSIS — E538 Deficiency of other specified B group vitamins: Secondary | ICD-10-CM | POA: Diagnosis not present

## 2016-05-14 DIAGNOSIS — Z7982 Long term (current) use of aspirin: Secondary | ICD-10-CM | POA: Diagnosis not present

## 2016-05-14 DIAGNOSIS — D696 Thrombocytopenia, unspecified: Secondary | ICD-10-CM | POA: Diagnosis not present

## 2016-05-14 DIAGNOSIS — R000 Tachycardia, unspecified: Secondary | ICD-10-CM | POA: Diagnosis not present

## 2016-05-14 DIAGNOSIS — D6959 Other secondary thrombocytopenia: Secondary | ICD-10-CM | POA: Diagnosis not present

## 2016-05-14 DIAGNOSIS — E785 Hyperlipidemia, unspecified: Secondary | ICD-10-CM | POA: Diagnosis not present

## 2016-05-14 DIAGNOSIS — R339 Retention of urine, unspecified: Secondary | ICD-10-CM | POA: Diagnosis not present

## 2016-05-14 DIAGNOSIS — I82401 Acute embolism and thrombosis of unspecified deep veins of right lower extremity: Secondary | ICD-10-CM | POA: Diagnosis not present

## 2016-05-14 DIAGNOSIS — I4891 Unspecified atrial fibrillation: Secondary | ICD-10-CM | POA: Diagnosis not present

## 2016-05-14 DIAGNOSIS — I471 Supraventricular tachycardia: Secondary | ICD-10-CM | POA: Diagnosis not present

## 2016-05-14 DIAGNOSIS — G9340 Encephalopathy, unspecified: Secondary | ICD-10-CM | POA: Diagnosis not present

## 2016-05-14 DIAGNOSIS — Z86718 Personal history of other venous thrombosis and embolism: Secondary | ICD-10-CM | POA: Diagnosis not present

## 2016-05-14 DIAGNOSIS — I82419 Acute embolism and thrombosis of unspecified femoral vein: Secondary | ICD-10-CM

## 2016-05-14 LAB — CBC
HCT: 26.1 % — ABNORMAL LOW (ref 35.0–47.0)
HEMOGLOBIN: 8.9 g/dL — AB (ref 12.0–16.0)
MCH: 29 pg (ref 26.0–34.0)
MCHC: 33.9 g/dL (ref 32.0–36.0)
MCV: 85.6 fL (ref 80.0–100.0)
Platelets: 44 10*3/uL — ABNORMAL LOW (ref 150–440)
RBC: 3.05 MIL/uL — ABNORMAL LOW (ref 3.80–5.20)
RDW: 14.7 % — AB (ref 11.5–14.5)
WBC: 14.8 10*3/uL — AB (ref 3.6–11.0)

## 2016-05-14 LAB — BASIC METABOLIC PANEL
ANION GAP: 5 (ref 5–15)
BUN: 11 mg/dL (ref 6–20)
CALCIUM: 7.7 mg/dL — AB (ref 8.9–10.3)
CO2: 19 mmol/L — ABNORMAL LOW (ref 22–32)
Chloride: 104 mmol/L (ref 101–111)
Creatinine, Ser: 0.95 mg/dL (ref 0.44–1.00)
GFR calc Af Amer: 59 mL/min — ABNORMAL LOW (ref >60)
GFR, EST NON AFRICAN AMERICAN: 51 mL/min — AB (ref >60)
GLUCOSE: 142 mg/dL — AB (ref 65–99)
Potassium: 3.5 mmol/L (ref 3.5–5.1)
SODIUM: 128 mmol/L — AB (ref 135–145)

## 2016-05-14 LAB — URINE CULTURE: Culture: >=100000 — AB

## 2016-05-14 MED ORDER — NITROFURANTOIN MONOHYD MACRO 100 MG PO CAPS
100.0000 mg | ORAL_CAPSULE | Freq: Two times a day (BID) | ORAL | Status: DC
  Administered 2016-05-14: 10:00:00 100 mg via ORAL
  Filled 2016-05-14 (×2): qty 1

## 2016-05-14 MED ORDER — NITROFURANTOIN MONOHYD MACRO 100 MG PO CAPS: 100.0000 mg | ORAL_CAPSULE | Freq: Two times a day (BID) | ORAL | 0 refills | Status: DC

## 2016-05-14 MED ORDER — HYDROCODONE-ACETAMINOPHEN 5-325 MG PO TABS: 1.0000 | ORAL_TABLET | Freq: Four times a day (QID) | ORAL | 0 refills | Status: AC | PRN

## 2016-05-14 NOTE — Consult Note (Signed)
[facility]  Date of admission: 05/11/2016  Inpatient day:  05/13/2016  Consulting physician: Dr. Vaughan Basta  Reason for Consultation:  Deep venous thrombosis.  Chief Complaint: [name] is a 82 y.o. 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  HPI:  The patient has been followed in the hematology clinic or thrombocytopenia and persistent monocytosis likely secondary to CMML.  Platelet  count has ranged between 60,000 - 80,000 over the past year.  She has not required intervention secondary to her age, performance status, and relative stability of her platelet count.  With infections, she has dropped her plaetlet count secondary to poor marrow reserve and consumption.  She has a history of recurrent urinary tract infections and chronic indwelling Foley. She presented with fever, confusion,weakness and inability to ambulate.  Urinalysis was consistent with a UTI.  Foley catheter has been removed.  She was placed on in and out catheterization.  She is on ceftriaxone.  The patient spends her day resting for most of the day.  She states that she doesn't walk any more.  She has had no falls.  She presented with right lower leg swelling.  Right lower extremity duplex revealed a nonocclusive thrombus in the right leg extending from the popliteal vein to the level of the common femoral vein.  Platelet count on admission was 39,000.  An IVC filter was placed by Dr. Lucky Cowboy today.  Symptomatically, the patient voices no complaints.  She remains confused.   Past Medical History:  Diagnosis Date  . Acquired complete AV block ([place]) 06/22/2012   Overview:  While on atenolol.  Again on diltiazem. Pacemaker placed 06/2012.   Marland Kitchen Anemia   . Arrhythmia   . Arthritis   . Atrial fibrillation ([place])   . BP (high blood pressure) 03/03/2015  . 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  Again on diltiazem.  Pacemaker placed 06/2012.   . Essential hypertension 03/01/2015  . HTN (hypertension)   . Hyperlipidemia   . Ischemic colitis ([place])   . Mobitz type 2 second degree heart block   . Pacemaker   . Stroke ([place])   . Supraventricular tachycardia ([place]) 06/22/2012   Overview:  Short bursts at rates up to 150bpm.   . Syncope   . Thrombocytopenia ([place])   . UTI (lower urinary tract infection) 01/12/2015  . Viral upper respiratory infection 06/14/2015    Past Surgical History:  Procedure Laterality Date  . CHOLECYSTECTOMY    . Left Hip Replacement     . PACEMAKER PLACEMENT      Family History  Problem Relation Age of Onset  . Stroke Mother   . Alcoholism Father   . Hypertension Sister   . Prostate cancer Neg Hx   . Bladder Cancer Neg Hx   . Kidney cancer Neg Hx     Social History:  reports that she has never smoked. She has never used smokeless tobacco. She reports that she drinks about 0.6 oz of alcohol per week . She reports that she does not use drugs. Patient's daughter is Joesph July (H: [phone]; [phone]).  She is alone today.  Allergies:  Allergies  Allergen Reactions  . Atenolol Other (See Comments)    Reaction:  Complete Heart Block  . Penicillins Other (See Comments)    rash  . Dilaudid [Hydromorphone Hcl] Rash  . Hydromorphone Rash    Medications Prior to Admission  Medication Sig  Dispense Refill  . acetaminophen (TYLENOL) 325 MG tablet Take by mouth.    Marland Kitchen aspirin EC 81 MG tablet Take 81 mg by mouth daily.    . carvedilol (COREG) 12.5 MG tablet Take 12.5 mg by mouth 2 (two) times daily.    . furosemide (LASIX) 20 MG tablet Take 10 mg by mouth daily as needed for edema.     . hydrALAZINE (APRESOLINE) 10 MG tablet TAKE 1 TABLET BY MOUTH TWICE DAILY 60 tablet 2  . HYDROcodone-acetaminophen (NORCO/VICODIN) 5-325 MG per tablet Take 1 tablet by mouth 4 (four) times daily as needed for moderate pain.    Marland Kitchen latanoprost (XALATAN) 0.005 % ophthalmic solution Place  1 drop into both eyes at bedtime.    Marland Kitchen losartan (COZAAR) 100 MG tablet Take 50 mg by mouth 2 (two) times daily.    . Multiple Vitamin (MULTIVITAMIN WITH MINERALS) TABS tablet Take 1 tablet by mouth daily.    . Multiple Vitamins-Minerals (PRESERVISION AREDS 2) CAPS Take 2 capsules by mouth daily. Reported on 06/22/2015    . Omega-3 Fatty Acids (FISH OIL) 1000 MG CAPS Take 2,000 mg by mouth daily.       Review of Systems: GENERAL: Feels "fine".  No fevers or sweats. Weight down 3 pounds since 01/30/2016. PERFORMANCE STATUS (ECOG): 3 HEENT:  No visual changes, runny nose, sore throat, mouth sores or tenderness. Lungs:  No shortness of breath or cough.  No hemoptysis. Cardiac:  No chest pain, palpitations, orthopnea, or PND.  Pacemaker. GI:  No nausea, vomiting, diarrhea, constipation, melena or hematochezia. GU:  Chronic urinary retention.  No urgency, frequency, dysuria, or hematuria. Musculoskeletal:  Back and hip pain (chronic).  No muscle tenderness. Extremities:  Right leg swelling. Skin:  No rashes or skin changes. Neuro:  General weakness.  States that she does not walk anymore.  No headache, numbness or weakness, balance or coordination issues. Endocrine:  No diabetes, thyroid issues, hot flashes or night sweats. Psych:  No mood changes, depression or anxiety. Pain:  No focal pain. Review of systems:  All other systems reviewed and found   Physical Exam:  Blood pressure (!) 126/54, pulse 86, temperature 98.4 F (36.9 C), temperature source Oral, resp. rate 16, height 5' 3" (1.6 m), weight 122 lb 14.4 oz (55.7 kg), SpO2 95 %.  GENERAL:  Thin elderly woman lying comfortably on the medical unit in no acute distress. MENTAL STATUS:  Alert and oriented to person.  She believes she is at home.  Recognizes me, but does not know who I am. HEAD:  Short blonde hair.  Normocephalic, atraumatic, face symmetric, no Cushingoid features. EYES:  Glasses.  Brown eyes.  Pupils equal round and  reactive to light and accomodation.  No conjunctivitis or scleral icterus. ENT:  Oropharynx clear without lesion. Tongue normal. Mucous membranes dry.  RESPIRATORY:  Clear to auscultation without rales, wheezes or rhonchi. CARDIOVASCULAR:  Regular rate and rhythm without murmur, rub or gallop. ABDOMEN:  Soft, non-tender, with active bowel sounds, and no hepatosplenomegaly.  No masses. SKIN:  No rashes, ulcers or lesions. EXTREMITIES: Right lower extremity edema with mild tenderness below the knee. LYMPH NODES: No palpable cervical, supraclavicular, axillary or inguinal adenopathy  NEUROLOGICAL: Moves all 4 extremities. Follows commands.  Confused. PSYCH:  Appropriate.   Results for orders placed or performed during the hospital encounter of 05/11/16 (from the past 48 hour(s))  Comprehensive metabolic panel     Status: Abnormal   Collection Time: 05/12/16  5:51 AM  Result Value Ref Range   Sodium 129 (L) 135 - 145 mmol/L   Potassium 4.0 3.5 - 5.1 mmol/L   Chloride 102 101 - 111 mmol/L   CO2 22 22 - 32 mmol/L   Glucose, Bld 114 (H) 65 - 99 mg/dL   BUN 10 6 - 20 mg/dL   Creatinine, Ser 0.86 0.44 - 1.00 mg/dL   Calcium 8.4 (L) 8.9 - 10.3 mg/dL   Total Protein 6.2 (L) 6.5 - 8.1 g/dL   Albumin 2.8 (L) 3.5 - 5.0 g/dL   AST 16 15 - 41 U/L   ALT 10 (L) 14 - 54 U/L   Alkaline Phosphatase 54 38 - 126 U/L   Total Bilirubin 0.9 0.3 - 1.2 mg/dL   GFR calc non Af Amer 57 (L) >60 mL/min   GFR calc Af Amer >60 >60 mL/min    Comment: (NOTE) The eGFR has been calculated using the CKD EPI equation. This calculation has not been validated in all clinical situations. eGFR's persistently <60 mL/min signify possible Chronic Kidney Disease.    Anion gap 5 5 - 15  CBC     Status: Abnormal   Collection Time: 05/12/16  5:51 AM  Result Value Ref Range   WBC 11.4 (H) 3.6 - 11.0 K/uL   RBC 3.20 (L) 3.80 - 5.20 MIL/uL   Hemoglobin 9.6 (L) 12.0 - 16.0 g/dL   HCT 27.8 (L) 35.0 - 47.0 %   MCV 87.0 80.0 -  100.0 fL   MCH 30.0 26.0 - 34.0 pg   MCHC 34.5 32.0 - 36.0 g/dL   RDW 14.7 (H) 11.5 - 14.5 %   Platelets 37 (L) 150 - 440 K/uL  CBC     Status: Abnormal   Collection Time: 05/13/16  4:20 AM  Result Value Ref Range   WBC 11.9 (H) 3.6 - 11.0 K/uL   RBC 3.19 (L) 3.80 - 5.20 MIL/uL   Hemoglobin 9.5 (L) 12.0 - 16.0 g/dL   HCT 27.8 (L) 35.0 - 47.0 %   MCV 87.2 80.0 - 100.0 fL   MCH 29.8 26.0 - 34.0 pg   MCHC 34.2 32.0 - 36.0 g/dL   RDW 14.7 (H) 11.5 - 14.5 %   Platelets 37 (L) 150 - 440 K/uL   No results found.  Assessment:  The patient is a 82 y.o. 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  She has chronic urinary retention and an indwelling Foley catheter.  Platelet count has ranged between 60,000 - 80,000 over the past year.  She is fairly immobile at home.  She presented with new right lower extremity edema.  Right lower extremity duplex revealed a nonocclusive thrombus from the popliteal vein to the level of the common femoral vein.  Platelet count is 37,000 - 39,000 secondary to poor marrow reserve and consumption of platelets.    She is not a candidate for anticoagulation as her platelet count is < 50,000.  An IVC filter was placed on 05/13/2016.  Plan:   1.  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  She has not been treated secondary to stability in her counts, lack of symptoms, age and performance status.  With prior infections, she has dropped her platelet count secondary to consumption of platelets and poor marrow reserve.  Septra for infections is avoided secondary to issues with myelosuppression.  Plan to follow-up in outpatient department and initiate anticoagulation if platelets return to baseline given concern for clot progression in immobile  patient.  Will need to discuss with daughter at that time regarding any concern for falls.  Thank you for allowing me to participate in [name] 's care.  I will follow her closely with you while  hospitalized and after discharge in the outpatient department.   Lequita Asal, MD  05/13/2016

## 2016-05-14 NOTE — Discharge Instructions (Signed)
Need to have anticoagulants started for her DVT once platelet count is stable and safe range- follow in hematology clinic.

## 2016-05-14 NOTE — NC FL2 (Signed)
[place] LEVEL OF CARE SCREENING TOOL     IDENTIFICATION  Patient Name: [name] Birthdate: 05-12-1923 Sex: female Admission Date (Current Location): 05/11/2016  [place] and [place] Number:  Engineering geologist and Address:  [facility], [address] [place], [place] [address]      Provider Number: [id number]  Attending Physician Name and Address:  Vaughan Basta, MD  Relative Name and Phone Number:     Joesph July Daughter [phone]  [phone]   [facility] Daughter [phone]      Current Level of Care: Hospital Recommended Level of Care: [place] Prior Approval Number:    Date Approved/Denied:   PASRR Number: [id number]  Discharge Plan: SNF    Current Diagnoses: Patient Active Problem List   Diagnosis Date Noted  . Dvt femoral (deep venous thrombosis) ([place])   . Encephalopathy acute 05/11/2016  . Generalized weakness 05/11/2016  . Chronic systolic congestive heart failure ([place]) 02/15/2016  . Hyponatremia 02/15/2016  . Viral upper respiratory infection 06/14/2015  . Status post left hip replacement 05/22/2015  . Anemia 05/01/2015  . Right knee injury 04/25/2015  . Low back pain 04/25/2015  . Ischemic colitis ([place]) 04/20/2015  . Bad odor of urine 03/17/2015  . Stage II pressure ulcer of left buttock 03/13/2015  . Artificial cardiac pacemaker 03/03/2015  . HLD (hyperlipidemia) 03/03/2015  . Colitis, ischemic ([place]) 03/03/2015  . Candidal intertrigo, inguinal 03/01/2015  . Essential hypertension 03/01/2015  . Monocytosis 02/09/2015  . Thrombocytopenia ([place]) 02/09/2015  . B12 deficiency 02/09/2015  . Urinary retention 02/09/2015  . Pressure ulcer of left coccygeal region, stage 2 02/09/2015  . Chronic myelomonocytic leukemia not having achieved remission ([place]) 02/02/2015  . Bacteremia 01/12/2015  . UTI (urinary tract infection) 01/12/2015  . Acquired  complete AV block ([place]) 06/22/2012  . Supraventricular tachycardia ([place]) 06/22/2012  . Complete atrioventricular block (HCC) 06/22/2012    Orientation RESPIRATION BLADDER Height & Weight     Self, Place  Normal Continent Weight: 122 lb 14.4 oz (55.7 kg) Height:  5\' 3"  (160 cm)  BEHAVIORAL SYMPTOMS/MOOD NEUROLOGICAL BOWEL NUTRITION STATUS      Continent Diet (Cardiac Diet)  AMBULATORY STATUS COMMUNICATION OF NEEDS Skin   Limited Assist Verbally Normal                       Personal Care Assistance Level of Assistance  Bathing, Feeding, Dressing Bathing Assistance: Limited assistance Feeding assistance: Limited assistance Dressing Assistance: Limited assistance     Functional Limitations Info  Sight, Hearing, Speech Sight Info: Adequate Hearing Info: Impaired Speech Info: Adequate    SPECIAL CARE FACTORS FREQUENCY  PT (By licensed PT)     PT Frequency: 5x a week              Contractures Contractures Info: Not present    Additional Factors Info  Code Status, Allergies Code Status Info: Full Code Allergies Info: ATENOLOL, PENICILLINS, DILAUDID HYDROMORPHONE HCL, HYDROMORPHONE            Current Medications (05/14/2016):  This is the current hospital active medication list Current Facility-Administered Medications  Medication Dose Route Frequency Provider Last Rate Last Dose  . acetaminophen (TYLENOL) tablet 650 mg  650 mg Oral Q6H PRN Idelle Crouch, MD   650 mg at 05/13/16 [id number]   Or  . acetaminophen (TYLENOL) suppository 650 mg  650 mg Rectal Q6H PRN Idelle Crouch, MD      .  aspirin EC tablet 81 mg  81 mg Oral Daily Idelle Crouch, MD   81 mg at 05/14/16 1122  . bisacodyl (DULCOLAX) suppository 10 mg  10 mg Rectal Daily PRN Idelle Crouch, MD      . carvedilol (COREG) tablet 12.5 mg  12.5 mg Oral BID Idelle Crouch, MD   12.5 mg at 05/14/16 1122  . docusate sodium (COLACE) capsule 100 mg  100 mg Oral BID Idelle Crouch, MD   100 mg at  05/14/16 1122  . hydrALAZINE (APRESOLINE) tablet 10 mg  10 mg Oral BID Idelle Crouch, MD   10 mg at 05/14/16 1122  . HYDROcodone-acetaminophen (NORCO/VICODIN) 5-325 MG per tablet 1 tablet  1 tablet Oral QID PRN Idelle Crouch, MD      . latanoprost (XALATAN) 0.005 % ophthalmic solution 1 drop  1 drop Both Eyes QHS Idelle Crouch, MD   1 drop at 05/13/16 2022  . losartan (COZAAR) tablet 50 mg  50 mg Oral BID Idelle Crouch, MD   50 mg at 05/14/16 1122  . multivitamin with minerals tablet 1 tablet  1 tablet Oral Daily Idelle Crouch, MD   1 tablet at 05/14/16 1122  . multivitamin-lutein (OCUVITE-LUTEIN) capsule 2 capsule  2 capsule Oral Daily Idelle Crouch, MD   2 capsule at 05/14/16 1122  . nitrofurantoin (macrocrystal-monohydrate) (MACROBID) capsule 100 mg  100 mg Oral Q12H Vaughan Basta, MD      . ondansetron (ZOFRAN) tablet 4 mg  4 mg Oral Q6H PRN Idelle Crouch, MD       Or  . ondansetron [facility]) injection 4 mg  4 mg Intravenous Q6H PRN Idelle Crouch, MD      . pantoprazole (PROTONIX) EC tablet 40 mg  40 mg Oral Daily Idelle Crouch, MD   40 mg at 05/14/16 1122     Discharge Medications: Please see discharge summary for a list of discharge medications.  Relevant Imaging Results:  Relevant Lab Results:   Additional Information SSN [id number]  Ross Ludwig

## 2016-05-14 NOTE — Clinical Social Work Note (Signed)
Patient to be d/c'ed today to [facility].    Patient and family agreeable to plans will transport via ems RN to call report to [phone].  Patient's daughter was at bedside and is aware of patient discharging today.  Evette Cristal, MSW Mon-Fri 8a-4:30p [phone]

## 2016-05-14 NOTE — Clinical Social Work Placement (Addendum)
   CLINICAL SOCIAL WORK PLACEMENT  NOTE  Date:  05/14/2016  Patient Details  Name: [name] MRN: [id number] Date of Birth: 1923/05/03  Clinical Social Work is seeking post-discharge placement for this patient at the [place] level of care (*CSW will initial, date and re-position this form in  chart as items are completed):  Yes   Patient/family provided with [place] Work Department's list of facilities offering this level of care within the geographic area requested by the patient (or if unable, by the patient's family).  Yes   Patient/family informed of their freedom to choose among providers that offer the needed level of care, that participate in Medicare, Medicaid or managed care program needed by the patient, have an available bed and are willing to accept the patient.  Yes   Patient/family informed of [place]'s ownership interest in [facility] and [facility], as well as of the fact that they are under no obligation to receive care at these facilities.  PASRR submitted to EDS on 05/14/16     PASRR number received on       Existing PASRR number confirmed on 05/14/16     FL2 transmitted to all facilities in geographic area requested by pt/family on 05/14/16     FL2 transmitted to all facilities within larger geographic area on       Patient informed that his/her managed care company has contracts with or will negotiate with certain facilities, including the following:        Yes   Patient/family informed of bed offers received.  Patient chooses bed at  [facility]     Physician recommends and patient chooses bed at      Patient to be transferred to  [facility] on  05-14-16  Patient to be transferred to facility by  [facility]     Patient family notified on   of transfer 05/14/16  Name of family member notified:   Joesph July daughter [phone]     PHYSICIAN Please sign FL2      Additional Comment:    _______________________________________________ Ross Ludwig 05/14/2016, 1:48 PM

## 2016-05-14 NOTE — Evaluation (Addendum)
Physical Therapy Evaluation Patient Details Name: [name] MRN: [id number] DOB: July 19, 1922 Today's Date: 05/14/2016   History of Present Illness  Pt admitted for UTI and noted to have R LE DVT on 12/9. Pt is now s/p IVC filter on 05/13/16. Daughter reports recent falls at home.  Clinical Impression  Pt is a pleasant 82 year old female who was admitted for UTI and is now s/p IVC filter. Pt performs bed mobility with mod assist and transfers/ambulation with min assist and RW. Pt demonstrates deficits with strength/mobility/endurance. Pt very agreeable to therapy, however is HOH. Pt is currently not at baseline level. Would benefit from skilled PT to address above deficits and promote optimal return to PLOF; recommend transition to STR upon discharge from acute hospitalization.       Follow Up Recommendations SNF    Equipment Recommendations       Recommendations for Other Services       Precautions / Restrictions Precautions Precautions: Fall Restrictions Weight Bearing Restrictions: No      Mobility  Bed Mobility Overal bed mobility: Needs Assistance Bed Mobility: Supine to Sit     Supine to sit: Mod assist     General bed mobility comments: assist for sequencing and scooting legs out towards EOB. Assist for scooting and sitting at EOB. Once stablized, able to sit with cga.  Transfers Overall transfer level: Needs assistance Equipment used: Rolling walker (2 wheeled) Transfers: Sit to/from Stand Sit to Stand: Min assist         General transfer comment: transfers performed with RW and safe technique. Assist for initiating movement and cues for safe technique.  Ambulation/Gait Ambulation/Gait assistance: Min assist Ambulation Distance (Feet): 2 Feet Assistive device: Rolling walker (2 wheeled) Gait Pattern/deviations: Step-to pattern     General Gait Details: ambulated to recliner with min assist and RW with heavy cues for stepping and upright posture. Pt  fatigues quickly, unable to further ambulate at this time.  Stairs            Wheelchair Mobility    Modified Rankin (Stroke Patients Only)       Balance Overall balance assessment: Needs assistance Sitting-balance support: Feet supported Sitting balance-Leahy Scale: Fair     Standing balance support: Bilateral upper extremity supported Standing balance-Leahy Scale: Fair                               Pertinent Vitals/Pain Pain Assessment: No/denies pain    Home Living Family/patient expects to be discharged to:: Private residence Living Arrangements: Children Available Help at Discharge: Family Type of Home: House Home Access: Stairs to enter Entrance Stairs-Rails: None Entrance Stairs-Number of Steps: 1 Home Layout: One level Home Equipment: Environmental consultant - 2 wheels;Walker - 4 wheels;Bedside commode      Prior Function Level of Independence: Needs assistance         Comments: Pt ambulatory with RW and supervision. Able to navigate household distances safely. Pt has aide for assistance from [employer] for 2 days/wk.     Hand Dominance        Extremity/Trunk Assessment   Upper Extremity Assessment: Overall WFL for tasks assessed           Lower Extremity Assessment: Generalized weakness (R LE 3/5; L LE grossly 3+/5)         Communication   Communication: HOH  Cognition Arousal/Alertness: Awake/alert Behavior During Therapy: WFL for tasks assessed/performed Overall Cognitive Status: Within  Functional Limits for tasks assessed                      General Comments      Exercises Other Exercises Other Exercises: Supine ther-ex performed on B LE including ankle pumps, SLRs, and hip abd/add. All ther-ex performed x 10 reps with cga for safe technique.    Assessment/Plan    PT Assessment Patient needs continued PT services  PT Problem List Decreased strength;Decreased activity tolerance;Decreased balance;Decreased  mobility;Pain          PT Treatment Interventions DME instruction;Therapeutic exercise;Gait training    PT Goals (Current goals can be found in the Care Plan section)  Acute Rehab PT Goals Patient Stated Goal: to get stronger PT Goal Formulation: With patient Time For Goal Achievement: 05/28/16 Potential to Achieve Goals: Good Additional Goals Additional Goal #1: Pt will be able to perform bed mobility/transfers with supervision in order to improve functional mobility.    Frequency Min 2X/week   Barriers to discharge        Co-evaluation               End of Session Equipment Utilized During Treatment: Gait belt Activity Tolerance: Patient tolerated treatment well Patient left: in chair;with chair alarm set;with family/visitor present Nurse Communication: Mobility status         Time: [id number] PT Time Calculation (min) (ACUTE ONLY): 27 min   Charges:   PT Evaluation $PT Eval High Complexity: 1 Procedure PT Treatments $Therapeutic Exercise: 8-22 mins   PT G Codes:        Laurella Tull Jun 13, 2016, 10:51 AM  Greggory Stallion, PT, DPT [phone]

## 2016-05-14 NOTE — Progress Notes (Signed)
 Vein & Vascular Surgery  Daily Progress Note  Subjective: 1 Day Post-Op: Ultrasound guidance for vascular access to the left vein, catheter placement into the inferior vena cava, inferior venacavogram with placement of a cook celect IVC filter for RLE DVT and severe thrombocytopenia precluding anticoagulation.    Patient without complaint with exception of right lower extremity swelling and some minor pain.   Objective: Vitals:   05/13/16 1216 05/13/16 1702 05/13/16 2019 05/14/16 0436  BP: (!) 113/45 131/84 (!) 129/56 (!) 126/54  Pulse: 73 96 (!) 106 86  Resp: 20 (!) 28 16   Temp: 98.3 F (36.8 C) 100.1 F (37.8 C) 98.9 F (37.2 C) 98.4 F (36.9 C)  TempSrc: Oral Oral Oral Oral  SpO2: 95% 95% 93% 95%  Weight:      Height:        Intake/Output Summary (Last 24 hours) at 05/14/16 [id number] Last data filed at 05/14/16 0442  Gross per 24 hour  Intake              170 ml  Output              700 ml  Net             -530 ml   Physical Exam: A&Ox3, NAD CV: RRR Pulmonary: CTA Bilaterally Abdomen: Soft, Nontender, Nondistended Groin: (left) Procedure dressing in place. No drainage or swelling.  GU: Foley intact, draining clear urine. Vascular:  Right Lower Extremity: Moderate edema. Mildly tender to palpation. No vascular compromise.    Laboratory: CBC    Component Value Date/Time   WBC 14.8 (H) 05/14/2016 0535   HGB 8.9 (L) 05/14/2016 0535   HGB 11.4 (L) 01/22/2013 1002   HCT 26.1 (L) 05/14/2016 0535   HCT 33.1 (L) 01/22/2013 1002   PLT 44 (L) 05/14/2016 0535   PLT 136 (L) 01/22/2013 1002   BMET    Component Value Date/Time   NA 128 (L) 05/14/2016 0535   NA 132 (L) 01/16/2013 0437   K 3.5 05/14/2016 0535   K 3.3 (L) 01/16/2013 0437   CL 104 05/14/2016 0535   CL 102 01/16/2013 0437   CO2 19 (L) 05/14/2016 0535   CO2 24 01/16/2013 0437   GLUCOSE 142 (H) 05/14/2016 0535   GLUCOSE 121 (H) 01/16/2013 0437   BUN 11 05/14/2016 0535   BUN 5 (L) 01/16/2013 0437    CREATININE 0.95 05/14/2016 0535   CREATININE 0.55 (L) 01/16/2013 0437   CALCIUM 7.7 (L) 05/14/2016 0535   CALCIUM 7.8 (L) 01/16/2013 0437   GFRNONAA 51 (L) 05/14/2016 0535   GFRNONAA >60 01/16/2013 0437   GFRAA 59 (L) 05/14/2016 0535   GFRAA >60 01/16/2013 0437   Assessment/Planning: 82 year old female s/p IVC filter placement for RLE DVT and severe thrombocytopenia precluding anticoagulation. 1) Remove dressing tomorrow. 2) IVC filter will most likely be permanent. 3) Patient to follow up with [place] as outpatient will place information in discharge instructions. 4) Will sign off for now. Please re-consult if necessary.   Marcelle Overlie PA-C 05/14/2016 9:26 AM

## 2016-05-14 NOTE — Clinical Social Work Note (Signed)
Clinical Social Work Assessment  Patient Details  Name: [name] MRN: [id number] Date of Birth: 1922-11-10  Date of referral:  05/14/16               Reason for consult:  Facility Placement                Permission sought to share information with:  Facility Sport and exercise psychologist, Family Supports Permission granted to share information::     Name::     [name] [phone]  [phone] or [name] [name] [phone]   Agency::  SNF admissions  Relationship::     Contact Information:     Housing/Transportation Living arrangements for the past 2 months:  Single Family Home Source of Information:  Patient, Adult Children Patient Interpreter Needed:  None Criminal Activity/Legal Involvement Pertinent to Current Situation/Hospitalization:  No - Comment as needed Significant Relationships:  Adult Children Lives with:  Adult Children Do you feel safe going back to the place where you live?  No Need for family participation in patient care:  Yes (Comment)  Care giving concerns:  Patient and [name] feel she needs short term rehab before she is able to discharge back home.   Social Worker assessment / plan:  Patient is a 82 year old female who lives with her [name] and has care providers at home.  Patient is alert and oriented x3, but hard of hearing.  Patient requested to have MSW speak to her [name] to discuss SNF placement options.  Patient states she has been to rehab in the past and would like to return back to [facility] if she can.  MSW spoke to patient and her [name] who were familiar with the process for looking for bed offers for patient.  MSW explained how insurance will pay for her stay and what to expect at SNF.  Patient and family gave MSW permission to fax information to SNFs in [place].  Patient and her [name] did not have any other questions.  Employment status:  Retired Forensic scientist:   Medicare PT Recommendations:  [place] / Referral to community resources:  [place]  Patient/Family's Response to care:  Patient and family agreeable to going to SNF for short term rehab.  Patient/Family's Understanding of and Emotional Response to Diagnosis, Current Treatment, and Prognosis:  Patient and family are aware of of current treatment plan and prognosis.  Emotional Assessment Appearance:  Appears stated age Attitude/Demeanor/Rapport:    Affect (typically observed):  Appropriate, Calm, Stable, Pleasant Orientation:  Oriented to Place, Oriented to Self, Oriented to  Time, Oriented to Situation Alcohol / Substance use:  Not Applicable Psych involvement (Current and /or in the community):  No (Comment)  Discharge Needs  Concerns to be addressed:  Lack of Support Readmission within the last 30 days:  No Current discharge risk:  Lack of support system Barriers to Discharge:  No Barriers Identified   Ross Ludwig 05/14/2016, 1:44 PM

## 2016-05-14 NOTE — Discharge Summary (Signed)
[facility] [place] NAME: [name]    MR#:  [id number]  DATE OF BIRTH:  [birth year]  DATE OF ADMISSION:  05/11/2016 ADMITTING PHYSICIAN: Idelle Crouch, MD  DATE OF DISCHARGE: 05/14/2016  PRIMARY CARE PHYSICIAN: Tommi Rumps, MD    ADMISSION DIAGNOSIS:  Lower urinary tract infectious disease [N39.0] Weakness [R53.1]  DISCHARGE DIAGNOSIS:  Principal Problem:   UTI (urinary tract infection) Active Problems:   Urinary retention   Encephalopathy acute   Generalized weakness   Dvt femoral (deep venous thrombosis) ([place])   SECONDARY DIAGNOSIS:   Past Medical History:  Diagnosis Date  . Acquired complete AV block ([place]) 06/22/2012   Overview:  While on atenolol.  Again on diltiazem. Pacemaker placed 06/2012.   Marland Kitchen Anemia   . Arrhythmia   . Arthritis   . Atrial fibrillation ([place])   . BP (high blood pressure) 03/03/2015  . Complete atrioventricular block ([place]) 06/22/2012   Overview:  While on atenolol.  Again on diltiazem. Pacemaker placed 06/2012.   . Essential hypertension 03/01/2015  . HTN (hypertension)   . Hyperlipidemia   . Ischemic colitis ([place])   . Mobitz type 2 second degree heart block   . Pacemaker   . Stroke ([place])   . Supraventricular tachycardia ([place]) 06/22/2012   Overview:  Short bursts at rates up to 150bpm.   . Syncope   . Thrombocytopenia ([place])   . UTI (lower urinary tract infection) 01/12/2015  . Viral upper respiratory infection 06/14/2015    HOSPITAL COURSE:   * Altered mental status secondary to metabolic encephalopathy secondary to urinary tract infection   Improved now. Back to baseline. Have some hearing deficit and dementia.  * Urinary tract infection   Follow urine culture, IV ceftriaxone for now.   cx grew E coli and e fecalis- both sensitive to nitrofurantoin.   Give total 10 days of therapy, received > 3 days in hospital.  * Chronic urinary retention, chronic Foley catheter.   Catheter  is removed by ER physician. Urology saw the patient. Patient still have complain of retention and requiring in and out catheter in hospital, we may consider replacing the catheter before discharge.  * Nonocclusive deep vein thrombosis   She is not a candidate for chemical anticoagulation because of low platelet count, called oncology and vascular consult.    S/p IVC filter 05/13/16. Vascular clinic follow up in 2 months.   FOllow in hematology clinic- once platelet comes up- they will start on anticoagulation.  * History of leukemia, thrombocytopenia and anemia   As per patient's daughter she is not on any chemotherapy but just on the regular checkup in maintenance at [facility].   Called oncology consult is patient's platelets are low with acute DVT.  * hypertension   Stable on current meds.  DISCHARGE CONDITIONS:   Stable.  CONSULTS OBTAINED:  Treatment Team:  Darcella Cheshire, MD Lloyd Huger, MD Nickie Retort, MD  DRUG ALLERGIES:   Allergies  Allergen Reactions  . Atenolol Other (See Comments)    Reaction:  Complete Heart Block  . Penicillins Other (See Comments)    rash  . Dilaudid [Hydromorphone Hcl] Rash  . Hydromorphone Rash    DISCHARGE MEDICATIONS:   Current Discharge Medication List    START taking these medications   Details  nitrofurantoin, macrocrystal-monohydrate, (MACROBID) 100 MG capsule Take 1 capsule (100 mg total) by mouth every 12 (twelve) hours. For 6 days Qty: 12  capsule, Refills: 0      CONTINUE these medications which have CHANGED   Details  HYDROcodone-acetaminophen (NORCO/VICODIN) 5-325 MG tablet Take 1 tablet by mouth 4 (four) times daily as needed for moderate pain. Qty: 20 tablet, Refills: 0      CONTINUE these medications which have NOT CHANGED   Details  acetaminophen (TYLENOL) 325 MG tablet Take by mouth.    aspirin EC 81 MG tablet Take 81 mg by mouth daily.    carvedilol (COREG) 12.5 MG tablet Take 12.5 mg by mouth  2 (two) times daily.    hydrALAZINE (APRESOLINE) 10 MG tablet TAKE 1 TABLET BY MOUTH TWICE DAILY Qty: 60 tablet, Refills: 2    latanoprost (XALATAN) 0.005 % ophthalmic solution Place 1 drop into both eyes at bedtime.    losartan (COZAAR) 100 MG tablet Take 50 mg by mouth 2 (two) times daily.    Multiple Vitamin (MULTIVITAMIN WITH MINERALS) TABS tablet Take 1 tablet by mouth daily.    Multiple Vitamins-Minerals (PRESERVISION AREDS 2) CAPS Take 2 capsules by mouth daily. Reported on 80/19/2017    Omega-3 Fatty Acids (FISH OIL) 1000 MG CAPS Take 2,000 mg by mouth daily.       STOP taking these medications     furosemide (LASIX) 20 MG tablet          DISCHARGE INSTRUCTIONS:    Remove dressing in groin from IVC filter placement- tomorrow ( 05/15/16) Follow with Oncology clinic in 1 week. Follow with Vascular clinic in 2 months.  If you experience worsening of your admission symptoms, develop shortness of breath, life threatening emergency, suicidal or homicidal thoughts you must seek medical attention immediately by calling 911 or calling your MD immediately  if symptoms less severe.  You Must read complete instructions/literature along with all the possible adverse reactions/side effects for all the Medicines you take and that have been prescribed to you. Take any new Medicines after you have completely understood and accept all the possible adverse reactions/side effects.   Please note  You were cared for by a hospitalist during your hospital stay. If you have any questions about your discharge medications or the care you received while you were in the hospital after you are discharged, you can call the unit and asked to speak with the hospitalist on call if the hospitalist that took care of you is not available. Once you are discharged, your primary care physician will handle any further medical issues. Please note that NO REFILLS for any discharge medications will be authorized  once you are discharged, as it is imperative that you return to your primary care physician (or establish a relationship with a primary care physician if you do not have one) for your aftercare needs so that they can reassess your need for medications and monitor your lab values.    Today   CHIEF COMPLAINT:   Chief Complaint  Patient presents with  . Weakness  . Fall  . Altered Mental Status    HISTORY OF PRESENT ILLNESS:  [name]  is a 82 y.o. female with a known history of HTN, CAD, SSS s/p pacer and hx of recurrent UTI's with chronic indwelling Foley now with fever, confusion, and weakness. Unable to ambulate. Was brought to ER where she was noted to be febrile with leukocytosis. UA grossly abnormal. She is now admitted. Lives with family who are unable to care for her at this time due to severe weakness and inability to ambulate  VITAL SIGNS:  Blood pressure (!) 126/54, pulse 86, temperature 98.4 F (36.9 C), temperature source Oral, resp. rate 16, height 5\' 3"  (1.6 m), weight 55.7 kg (122 lb 14.4 oz), SpO2 95 %.  I/O:   Intake/Output Summary (Last 24 hours) at 05/14/16 1046 Last data filed at 05/14/16 0800  Gross per 24 hour  Intake              410 ml  Output              700 ml  Net             -290 ml    PHYSICAL EXAMINATION:  GENERAL:  82 y.o.-year-old patient lying in the bed with no acute distress.  EYES: Pupils equal, round, reactive to light and accommodation. No scleral icterus. Extraocular muscles intact.  HEENT: Head atraumatic, normocephalic. Oropharynx and nasopharynx clear.  NECK:  Supple, no jugular venous distention. No thyroid enlargement, no tenderness.  LUNGS: Normal breath sounds bilaterally, no wheezing, rales,rhonchi or crepitation. No use of accessory muscles of respiration.  CARDIOVASCULAR: S1, S2 normal. No murmurs, rubs, or gallops.  ABDOMEN: Soft, nontender, nondistended. Bowel sounds present. No organomegaly or mass. Foley cat in  place, EXTREMITIES: No pedal edema, cyanosis, or clubbing.  NEUROLOGIC: Cranial nerves II through XII are intact. Muscle strength 4/5 in upper extremities, 2 out of 5 in lower extremities. Sensation intact. Gait not checked.  PSYCHIATRIC: The patient is alert and oriented x 2.  SKIN: No obvious rash, lesion, or ulcer.   DATA REVIEW:   CBC  Recent Labs Lab 05/14/16 0535  WBC 14.8*  HGB 8.9*  HCT 26.1*  PLT 44*    Chemistries   Recent Labs Lab 05/12/16 0551 05/14/16 0535  NA 129* 128*  K 4.0 3.5  CL 102 104  CO2 22 19*  GLUCOSE 114* 142*  BUN 10 11  CREATININE 0.86 0.95  CALCIUM 8.4* 7.7*  AST 16  --   ALT 10*  --   ALKPHOS 54  --   BILITOT 0.9  --     Cardiac Enzymes No results for input(s): TROPONINI in the last 168 hours.  Microbiology Results  Results for orders placed or performed during the hospital encounter of 05/11/16  Urine culture     Status: Abnormal   Collection Time: 05/11/16  1:47 PM  Result Value Ref Range Status   Specimen Description URINE, RANDOM  Final   Special Requests NONE  Final   Culture (A)  Final    >=100,000 COLONIES/mL ESCHERICHIA COLI >=100,000 COLONIES/mL ENTEROCOCCUS FAECALIS    Report Status 05/14/2016 FINAL  Final   Organism ID, Bacteria ESCHERICHIA COLI (A)  Final   Organism ID, Bacteria ENTEROCOCCUS FAECALIS (A)  Final      Susceptibility   Escherichia coli - MIC*    AMPICILLIN >=32 RESISTANT Resistant     CEFAZOLIN <=4 SENSITIVE Sensitive     CEFTRIAXONE <=1 SENSITIVE Sensitive     CIPROFLOXACIN >=4 RESISTANT Resistant     GENTAMICIN >=16 RESISTANT Resistant     IMIPENEM <=0.25 SENSITIVE Sensitive     NITROFURANTOIN <=16 SENSITIVE Sensitive     TRIMETH/SULFA <=20 SENSITIVE Sensitive     AMPICILLIN/SULBACTAM >=32 RESISTANT Resistant     PIP/TAZO 8 SENSITIVE Sensitive     Extended ESBL NEGATIVE Sensitive     * >=100,000 COLONIES/mL ESCHERICHIA COLI   Enterococcus faecalis - MIC*    AMPICILLIN <=2 SENSITIVE  Sensitive     LEVOFLOXACIN >=8 RESISTANT Resistant  NITROFURANTOIN <=16 SENSITIVE Sensitive     VANCOMYCIN 1 SENSITIVE Sensitive     * >=100,000 COLONIES/mL ENTEROCOCCUS FAECALIS  Culture, blood (routine x 2)     Status: None (Preliminary result)   Collection Time: 05/11/16  1:47 PM  Result Value Ref Range Status   Specimen Description BLOOD RIGHT FOREARM  Final   Special Requests   Final    BOTTLES DRAWN AEROBIC AND ANAEROBIC  AER 10CC ANA 11CC   Culture NO GROWTH 3 DAYS  Final   Report Status PENDING  Incomplete  Culture, blood (routine x 2)     Status: None (Preliminary result)   Collection Time: 05/11/16  1:48 PM  Result Value Ref Range Status   Specimen Description BLOOD LEFT ANTECUBITAL  Final   Special Requests   Final    BOTTLES DRAWN AEROBIC AND ANAEROBIC  AER 6CC ANA 11CC   Culture NO GROWTH 3 DAYS  Final   Report Status PENDING  Incomplete    RADIOLOGY:  No results found.  EKG:   Orders placed or performed in visit on 05/11/16  . EKG 12-Lead      Management plans discussed with the patient, family and they are in agreement.  CODE STATUS:     Code Status Orders        Start     Ordered   05/11/16 1702  Full code  Continuous     05/11/16 1701    Code Status History    Date Active Date Inactive Code Status Order ID Comments User Context   02/02/2015 10:14 AM 02/07/2015  4:52 PM DNR [id number]  Max Sane, MD Inpatient   01/31/2015  6:50 PM 02/02/2015 10:14 AM Full Code [id number]  Vaughan Basta, MD Inpatient   01/10/2015  9:53 PM 01/12/2015  8:05 PM Full Code [id number]  Bettey Costa, MD Inpatient   01/10/2015  8:19 PM 01/10/2015  9:53 PM DNR [id number]  Bettey Costa, MD ED    Advance Directive Documentation   Flowsheet Row Most Recent Value  Type of Advance Directive  Healthcare Power of Attorney, Living will  Pre-existing out of facility DNR order (yellow form or pink MOST form)  No data  "MOST" Form in Place?  No data      TOTAL TIME TAKING CARE OF THIS  PATIENT: 35 minutes.    Vaughan Basta M.D on 05/14/2016 at 10:46 AM  Between 7am to 6pm - Pager - [phone]  After 6pm go to [email or web address] - password [email or web address] [facility]  Office  [phone]  CC: Primary care physician; Tommi Rumps, MD   Note: This dictation was prepared with Dragon dictation along with smaller phrase technology. Any transcriptional errors that result from this process are unintentional.

## 2016-05-15 DIAGNOSIS — R339 Retention of urine, unspecified: Secondary | ICD-10-CM | POA: Diagnosis not present

## 2016-05-15 DIAGNOSIS — D696 Thrombocytopenia, unspecified: Secondary | ICD-10-CM | POA: Diagnosis not present

## 2016-05-15 DIAGNOSIS — N390 Urinary tract infection, site not specified: Secondary | ICD-10-CM | POA: Diagnosis not present

## 2016-05-15 DIAGNOSIS — I10 Essential (primary) hypertension: Secondary | ICD-10-CM | POA: Diagnosis not present

## 2016-05-16 ENCOUNTER — Telehealth: Payer: Self-pay | Admitting: *Deleted

## 2016-05-16 DIAGNOSIS — I10 Essential (primary) hypertension: Secondary | ICD-10-CM | POA: Diagnosis not present

## 2016-05-16 DIAGNOSIS — I82401 Acute embolism and thrombosis of unspecified deep veins of right lower extremity: Secondary | ICD-10-CM | POA: Diagnosis not present

## 2016-05-16 DIAGNOSIS — C9100 Acute lymphoblastic leukemia not having achieved remission: Secondary | ICD-10-CM | POA: Diagnosis not present

## 2016-05-16 DIAGNOSIS — D696 Thrombocytopenia, unspecified: Secondary | ICD-10-CM | POA: Diagnosis not present

## 2016-05-16 DIAGNOSIS — N390 Urinary tract infection, site not specified: Secondary | ICD-10-CM | POA: Diagnosis not present

## 2016-05-16 DIAGNOSIS — K7291 Hepatic failure, unspecified with coma: Secondary | ICD-10-CM | POA: Diagnosis not present

## 2016-05-16 LAB — CULTURE, BLOOD (ROUTINE X 2)
Culture: NO GROWTH
Culture: NO GROWTH

## 2016-05-16 NOTE — Telephone Encounter (Signed)
Called Daughter Tomi Bamberger to discuss patients condition.  Reports that she feels her mom is doing better, she is currently at [facility] for rehab, but the leg is still swollen from the DVT.  Daughter reports that they are ok to keep the appt on 05-30-16 and will call with changes and if need arises to see MD earlier.

## 2016-05-21 ENCOUNTER — Ambulatory Visit: Payer: Medicare Other | Admitting: Family Medicine

## 2016-05-28 ENCOUNTER — Telehealth: Payer: Self-pay | Admitting: *Deleted

## 2016-05-28 NOTE — Telephone Encounter (Addendum)
Per Dr Janese Banks, keep leg elevated and keep appt for 12/28. Tomi Bamberger advised of this and stated that it  Is being elevated and it has actually come down some since yesterday and they will keep appt for Thursday

## 2016-05-28 NOTE — Telephone Encounter (Signed)
Called with concern that her Right leg is more swollen. She is s/p IVC filter for nonoclusive DVT right leg. What to do, her appt is not until 12/28

## 2016-05-28 NOTE — Telephone Encounter (Signed)
I dont believe we are treating her DVT/ swollen leg- not that I can see from her notes. I would ask her to f/u with her pcp if he has been the one dealing with this issue and keep her appointment for 12/28

## 2016-05-30 ENCOUNTER — Inpatient Hospital Stay: Payer: No Typology Code available for payment source

## 2016-05-30 ENCOUNTER — Inpatient Hospital Stay: Payer: No Typology Code available for payment source | Attending: Hematology and Oncology

## 2016-05-30 ENCOUNTER — Inpatient Hospital Stay (HOSPITAL_BASED_OUTPATIENT_CLINIC_OR_DEPARTMENT_OTHER): Payer: No Typology Code available for payment source | Admitting: Hematology and Oncology

## 2016-05-30 VITALS — BP 125/79 | HR 108 | Temp 98.8°F | Resp 18 | Wt 115.0 lb

## 2016-05-30 DIAGNOSIS — Z79899 Other long term (current) drug therapy: Secondary | ICD-10-CM | POA: Diagnosis not present

## 2016-05-30 DIAGNOSIS — C9310 Chronic myelomonocytic leukemia not having achieved remission: Secondary | ICD-10-CM | POA: Insufficient documentation

## 2016-05-30 DIAGNOSIS — I4891 Unspecified atrial fibrillation: Secondary | ICD-10-CM | POA: Insufficient documentation

## 2016-05-30 DIAGNOSIS — Z96642 Presence of left artificial hip joint: Secondary | ICD-10-CM | POA: Insufficient documentation

## 2016-05-30 DIAGNOSIS — I82411 Acute embolism and thrombosis of right femoral vein: Secondary | ICD-10-CM

## 2016-05-30 DIAGNOSIS — E538 Deficiency of other specified B group vitamins: Secondary | ICD-10-CM

## 2016-05-30 DIAGNOSIS — Z8673 Personal history of transient ischemic attack (TIA), and cerebral infarction without residual deficits: Secondary | ICD-10-CM | POA: Insufficient documentation

## 2016-05-30 DIAGNOSIS — I442 Atrioventricular block, complete: Secondary | ICD-10-CM | POA: Insufficient documentation

## 2016-05-30 DIAGNOSIS — D6959 Other secondary thrombocytopenia: Secondary | ICD-10-CM | POA: Diagnosis not present

## 2016-05-30 DIAGNOSIS — E785 Hyperlipidemia, unspecified: Secondary | ICD-10-CM | POA: Diagnosis not present

## 2016-05-30 DIAGNOSIS — I10 Essential (primary) hypertension: Secondary | ICD-10-CM | POA: Insufficient documentation

## 2016-05-30 DIAGNOSIS — Z950 Presence of cardiac pacemaker: Secondary | ICD-10-CM | POA: Insufficient documentation

## 2016-05-30 DIAGNOSIS — D72821 Monocytosis (symptomatic): Secondary | ICD-10-CM | POA: Diagnosis not present

## 2016-05-30 DIAGNOSIS — Z7982 Long term (current) use of aspirin: Secondary | ICD-10-CM | POA: Insufficient documentation

## 2016-05-30 DIAGNOSIS — Z86718 Personal history of other venous thrombosis and embolism: Secondary | ICD-10-CM | POA: Insufficient documentation

## 2016-05-30 DIAGNOSIS — D696 Thrombocytopenia, unspecified: Secondary | ICD-10-CM

## 2016-05-30 DIAGNOSIS — I471 Supraventricular tachycardia: Secondary | ICD-10-CM | POA: Insufficient documentation

## 2016-05-30 DIAGNOSIS — D6489 Other specified anemias: Secondary | ICD-10-CM

## 2016-05-30 LAB — CBC WITH DIFFERENTIAL/PLATELET
Band Neutrophils: 1 %
Basophils Absolute: 0.1 10*3/uL (ref 0–0.1)
Basophils Relative: 1 %
Eosinophils Absolute: 0.1 10*3/uL (ref 0–0.7)
Eosinophils Relative: 1 %
HCT: 31.2 % — ABNORMAL LOW (ref 35.0–47.0)
Hemoglobin: 10.4 g/dL — ABNORMAL LOW (ref 12.0–16.0)
Lymphocytes Relative: 19 %
Lymphs Abs: 2.4 10*3/uL (ref 1.0–3.6)
MCH: 28.4 pg (ref 26.0–34.0)
MCHC: 33.4 g/dL (ref 32.0–36.0)
MCV: 85 fL (ref 80.0–100.0)
Monocytes Absolute: 1.4 10*3/uL — ABNORMAL HIGH (ref 0.2–0.9)
Monocytes Relative: 11 %
Neutro Abs: 8.8 10*3/uL — ABNORMAL HIGH (ref 1.4–6.5)
Neutrophils Relative %: 67 %
Platelets: 66 10*3/uL — ABNORMAL LOW (ref 150–440)
RBC: 3.67 MIL/uL — ABNORMAL LOW (ref 3.80–5.20)
RDW: 14.9 % — ABNORMAL HIGH (ref 11.5–14.5)
WBC: 12.8 10*3/uL — ABNORMAL HIGH (ref 3.6–11.0)

## 2016-05-30 LAB — COMPREHENSIVE METABOLIC PANEL
ALT: 12 U/L — ABNORMAL LOW (ref 14–54)
AST: 19 U/L (ref 15–41)
Albumin: 3 g/dL — ABNORMAL LOW (ref 3.5–5.0)
Alkaline Phosphatase: 74 U/L (ref 38–126)
Anion gap: 6 (ref 5–15)
BUN: 10 mg/dL (ref 6–20)
CO2: 21 mmol/L — ABNORMAL LOW (ref 22–32)
Calcium: 8.5 mg/dL — ABNORMAL LOW (ref 8.9–10.3)
Chloride: 103 mmol/L (ref 101–111)
Creatinine, Ser: 0.9 mg/dL (ref 0.44–1.00)
GFR calc Af Amer: >60 mL/min (ref >60)
GFR calc non Af Amer: 54 mL/min — ABNORMAL LOW (ref >60)
Glucose, Bld: 118 mg/dL — ABNORMAL HIGH (ref 65–99)
Potassium: 3.9 mmol/L (ref 3.5–5.1)
Sodium: 130 mmol/L — ABNORMAL LOW (ref 135–145)
Total Bilirubin: 0.5 mg/dL (ref 0.3–1.2)
Total Protein: 6.7 g/dL (ref 6.5–8.1)

## 2016-05-30 MED ORDER — CYANOCOBALAMIN 1000 MCG/ML IJ SOLN
1000.0000 ug | Freq: Once | INTRAMUSCULAR | Status: AC
  Administered 2016-05-30: 1000 ug via INTRAMUSCULAR
  Filled 2016-05-30: qty 1

## 2016-05-30 NOTE — Progress Notes (Signed)
Patient recently in the hospital for UTI.  Patient has catheter.  Just finished a round of Macrobid.  Now in rehab @ [facility] since 05-14-16.  Not eating or sleeping well there.  Family is bringing in food from outside the facility.  Also drinking protein shakes.

## 2016-05-30 NOTE — Progress Notes (Signed)
[facility] day:  05/30/2016   Chief Complaint: [name] is a 82 y.o. 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  HPI: The patient  was last seen in the medical oncology clinic on 01/30/2016.  At that time, she was doing well.  Her performance status remained modest.  Platelet count was 80,000 (improved).  We discussed continued observation.  She was admitted to [facility] from 05/11/2016 - 05/14/2016 with a UTI and general weakness.  Urine culture grew E coli and E fecalis.  She was treated with ceftriaxone and discharged on nitrofurantoin.  While hospitalized, she was diagnosed with a DVT.  Right lower extremity duplex on 05/11/2016 revealed a nonocclusive thrombus extending from the popliteal vein to the level of the common femoral vein.  Platelet count was low (37,000 - 44,000).  Anticoagulation was deferred.  An IVC filter was placed on 05/13/2016.  She is in rehabilitation at [facility].  She is receiving OT and PT daily.  She states that her right leg remains swollen.  She has a Foley catheter in place as her bladder is not emptying.  She is not eating well.  Her family brings her meals.  She is supplementing with protein shakes.  She lost her bottom plate.   Past Medical History:  Diagnosis Date  . Acquired complete AV block ([place]) 06/22/2012   Overview:  While on atenolol.  Again on diltiazem. Pacemaker placed 06/2012.   Marland Kitchen Anemia   . Arrhythmia   . Arthritis   . Atrial fibrillation ([place])   . BP (high blood pressure) 03/03/2015  . Complete atrioventricular block ([place]) 06/22/2012   Overview:  While on atenolol.  Again on diltiazem. Pacemaker placed 06/2012.   . Essential hypertension 03/01/2015  . HTN (hypertension)   . Hyperlipidemia   . Ischemic colitis ([place])   . Mobitz type 2 second degree heart block   . Pacemaker   . Stroke ([place])   .  Supraventricular tachycardia ([place]) 06/22/2012   Overview:  Short bursts at rates up to 150bpm.   . Syncope   . Thrombocytopenia ([place])   . UTI (lower urinary tract infection) 01/12/2015  . Viral upper respiratory infection 06/14/2015    Past Surgical History:  Procedure Laterality Date  . CHOLECYSTECTOMY    . Left Hip Replacement     . PACEMAKER PLACEMENT    . PERIPHERAL VASCULAR CATHETERIZATION N/A 05/13/2016   Procedure: IVC Filter Insertion;  Surgeon: Algernon Huxley, MD;  Location: [facility];  Service: Cardiovascular;  Laterality: N/A;    Family History  Problem Relation Age of Onset  . Stroke Mother   . Alcoholism Father   . Hypertension Sister   . Prostate cancer Neg Hx   . Bladder Cancer Neg Hx   . Kidney cancer Neg Hx     Social History:  reports that she has never smoked. She has never used smokeless tobacco. She reports that she drinks about 0.6 oz of alcohol per week . She reports that she does not use drugs.  She has been in rehab at [facility] since 05/14/2016.  Allergies:  Allergies  Allergen Reactions  . Atenolol Other (See Comments)    Reaction:  Complete Heart Block  . Penicillins Other (See Comments)    rash  . Dilaudid [Hydromorphone Hcl] Rash  . Hydromorphone Rash    Current Medications: Current Outpatient Prescriptions  Medication Sig Dispense Refill  . acetaminophen (TYLENOL) 325 MG tablet Take by mouth.    Marland Kitchen aspirin EC 81 MG tablet Take 81 mg by mouth daily.    . carvedilol (COREG) 12.5 MG tablet Take 12.5 mg by mouth 2 (two) times daily.    . hydrALAZINE (APRESOLINE) 10 MG tablet TAKE 1 TABLET BY MOUTH TWICE DAILY 60 tablet 2  . HYDROcodone-acetaminophen (NORCO/VICODIN) 5-325 MG tablet Take 1 tablet by mouth 4 (four) times daily as needed for moderate pain. 20 tablet 0  . latanoprost (XALATAN) 0.005 % ophthalmic solution Place 1 drop into both eyes at bedtime.    Marland Kitchen losartan (COZAAR) 100 MG tablet Take 50 mg by mouth 2 (two) times daily.     . Multiple Vitamin (MULTIVITAMIN WITH MINERALS) TABS tablet Take 1 tablet by mouth daily.    . Multiple Vitamins-Minerals (PRESERVISION AREDS 2) CAPS Take 2 capsules by mouth daily. Reported on 06/22/2015    . Omega-3 Fatty Acids (FISH OIL) 1000 MG CAPS Take 2,000 mg by mouth daily.      No current facility-administered medications for this visit.     Review of Systems:  GENERAL: Feels "ok".  No fevers or sweats.  Weight down 10 pounds. PERFORMANCE STATUS (ECOG):  3 HEENT:  No visual changes, runny nose, sore throat, mouth sores or tenderness. Lungs:  No shortness of breath or cough.  No hemoptysis. Cardiac:  No chest pain, palpitations, orthopnea, or PND.  Pacemaker. GI:  Eating poorly (see HPI).  No nausea, vomiting, diarrhea, constipation, melena or hematochezia. GU:  Urinary retention with Foley catheter.  No urgency, frequency, dysuria, or hematuria. Musculoskeletal:  Back and hip pain.  No muscle tenderness. Extremities:  Right lower extremity swelling s/p DVT.  No pain. Skin:  No rashes or skin changes. Neuro:  No headache, numbness or weakness, balance or coordination issues. Endocrine:  No diabetes, thyroid issues, hot flashes or night sweats. Psych:  No mood changes, depression or anxiety. Pain:  No focal pain. Review of systems:  All other systems reviewed and found to be negative.  Physical Exam: Blood pressure 125/79, pulse (!) 108, temperature 98.8 F (37.1 C), temperature source Tympanic, resp. rate 18, weight 115 lb (52.2 kg). GENERAL:  Thin elderly woman sitting comfortably in a wheelchair in the exam room in no acute distress. MENTAL STATUS:  Alert and oriented to person, place and time. HEAD:  Shoulder length blonde hair.  Normocephalic, atraumatic, face symmetric, no Cushingoid features. EYES:  Glasses.  Brown eyes.  Pupils equal round and reactive to light and accomodation.  No conjunctivitis or scleral icterus. ENT:  Oropharynx clear without lesion. Dentures.  Tongue normal. Mucous membranes moist.  RESPIRATORY:  Clear to auscultation without rales, wheezes or rhonchi. CARDIOVASCULAR:  Regular rate and rhythm without murmur, rub or gallop. ABDOMEN:  Soft, non-tender, with active bowel sounds, and no hepatosplenomegaly.  No masses. GENITOURINARY:  Foley catheter in place. SKIN:  No rashes, ulcers or lesions. EXTREMITIES:   Right lower extremity edema.  No skin discoloration or tenderness.  No palpable cords. LYMPH NODES: No palpable cervical, supraclavicular, axillary or inguinal adenopathy  NEUROLOGICAL: Unremarkable. PSYCH:  Appropriate.   Appointment on 05/30/2016  Component Date Value Ref Range Status  . WBC 05/30/2016 12.8* 3.6 - 11.0 K/uL Final  . RBC 05/30/2016 3.67* 3.80 - 5.20 MIL/uL Final  . Hemoglobin 05/30/2016 10.4* 12.0 - 16.0 g/dL Final  . HCT 05/30/2016 31.2* 35.0 - 47.0 % Final  . MCV 05/30/2016 85.0  80.0 - 100.0 fL Final  . MCH 05/30/2016 28.4  26.0 - 34.0 pg Final  . MCHC 05/30/2016 33.4  32.0 - 36.0 g/dL Final  . RDW 05/30/2016 14.9* 11.5 - 14.5 % Final  . Platelets 05/30/2016 66* 150 - 440 K/uL Final  . Neutrophils Relative % 05/30/2016 PENDING  % Incomplete  . Neutro Abs 05/30/2016 PENDING  1.7 - 7.7 K/uL Incomplete  . Band Neutrophils 05/30/2016 PENDING  % Incomplete  . Lymphocytes Relative 05/30/2016 PENDING  % Incomplete  . Lymphs Abs 05/30/2016 PENDING  0.7 - 4.0 K/uL Incomplete  . Monocytes Relative 05/30/2016 PENDING  % Incomplete  . Monocytes Absolute 05/30/2016 PENDING  0.1 - 1.0 K/uL Incomplete  . Eosinophils Relative 05/30/2016 PENDING  % Incomplete  . Eosinophils Absolute 05/30/2016 PENDING  0.0 - 0.7 K/uL Incomplete  . Basophils Relative 05/30/2016 PENDING  % Incomplete  . Basophils Absolute 05/30/2016 PENDING  0.0 - 0.1 K/uL Incomplete  . WBC Morphology 05/30/2016 PENDING   Incomplete  . RBC Morphology 05/30/2016 PENDING   Incomplete  . Smear Review 05/30/2016 PENDING   Incomplete  . Other 05/30/2016  PENDING  % Incomplete  . nRBC 05/30/2016 PENDING  0 /100 WBC Incomplete  . Metamyelocytes Relative 05/30/2016 PENDING  % Incomplete  . Myelocytes 05/30/2016 PENDING  % Incomplete  . Promyelocytes Absolute 05/30/2016 PENDING  % Incomplete  . Blasts 05/30/2016 PENDING  % Incomplete  . Sodium 05/30/2016 130* 135 - 145 mmol/L Final  . Potassium 05/30/2016 3.9  3.5 - 5.1 mmol/L Final  . Chloride 05/30/2016 103  101 - 111 mmol/L Final  . CO2 05/30/2016 21* 22 - 32 mmol/L Final  . Glucose, Bld 05/30/2016 118* 65 - 99 mg/dL Final  . BUN 05/30/2016 10  6 - 20 mg/dL Final  . Creatinine, Ser 05/30/2016 0.90  0.44 - 1.00 mg/dL Final  . Calcium 05/30/2016 8.5* 8.9 - 10.3 mg/dL Final  . Total Protein 05/30/2016 6.7  6.5 - 8.1 g/dL Final  . Albumin 05/30/2016 3.0* 3.5 - 5.0 g/dL Final  . AST 05/30/2016 19  15 - 41 U/L Final  . ALT 05/30/2016 12* 14 - 54 U/L Final  . Alkaline Phosphatase 05/30/2016 74  38 - 126 U/L Final  . Total Bilirubin 05/30/2016 0.5  0.3 - 1.2 mg/dL Final  . GFR calc non Af Amer 05/30/2016 54* >60 mL/min Final  . GFR calc Af Amer 05/30/2016 >60  >60 mL/min Final   Comment: (NOTE) The eGFR has been calculated using the CKD EPI equation. This calculation has not been validated in all clinical situations. eGFR's persistently <60 mL/min signify possible Chronic Kidney Disease.   . Anion gap 05/30/2016 6  5 - 15 Final    Assessment:  [name] is a 82 y.o. 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  She has a history of recurrent E coli UTI and associated bacteremia. She has a history of thrombocytopenia secondary to consumption associated with infection. She has poor marrow reserve secondary to her age.She has been treated with Septra, known myelosuppressant agent.  Work-up on 02/03/2015 revealed a low normal B12 (284) with an elevated MMA (390) consistent with a B12 deficiency.  Folate was normal (20.2) on 02/03/2015.  SPEP on 02/03/2015  revealed no monoclonal protein.  Free light chains were negative.  ANA was negative. Coagulation studies were normal.  Creatinine was 0.87.  She has B12 deficiency.  She began B12 supplimentation on 02/17/2015.  She receives B12 monthly (last 12/28/2015).  Peripheral smear  on 02/02/2015 revealed marked thrombocytopenia, scattered teardrop cells, and rare schistocytes. BCR-ABL on 02/05/2015 was negative, ruling out CML.  Flow cytometry on 02/05/2015 revealed absolute monocytosis with phenotypic aberrancies.  There was dim partial aberrant upregulation of CD56 and mild down-regulation of CD11b, CD11c and HLA-DR. Changes can be associated with reactive monocytosis and  myelodysplastic/myeloproliferative disorders.    She was admitted to [facility] from 05/11/2016 - 05/14/2016 with an E coli and E fecalis UTI.  She was treated with ceftriaxone and discharged on nitrofurantoin.  Right lower extremity duplex on 05/11/2016 revealed a nonocclusive thrombus extending from the popliteal vein to the level of the common femoral vein.  Platelet count was low (37,000 - 44,000).  Anticoagulation was deferred.  An IVC filter was placed on 05/13/2016.  She is receiving rehab at [facility].  She has a right lower extremity swelling s/p DVT.  Platelet count is 66,000.  Plan: 1.  Labs today:  CBC with diff, CMP. 2.  Discuss recent hospitalization and diagnosis of DVT.  Discuss no anticoagulation unless platelet count improves. 3.  Discuss ongoing observation of probable underlying diagnosis of CMML.  Patient feels comfortable with this plan. 4.  B12 today and monthly. 5.  RTC in 1 month for MD assess, labs (CBC with diff, CMP), and B12.   Lequita Asal, MD  05/30/2016, 2:58 PM

## 2016-06-04 DIAGNOSIS — N390 Urinary tract infection, site not specified: Secondary | ICD-10-CM | POA: Diagnosis not present

## 2016-06-04 DIAGNOSIS — R000 Tachycardia, unspecified: Secondary | ICD-10-CM | POA: Diagnosis not present

## 2016-06-04 DIAGNOSIS — L538 Other specified erythematous conditions: Secondary | ICD-10-CM | POA: Diagnosis not present

## 2016-06-05 DIAGNOSIS — N390 Urinary tract infection, site not specified: Secondary | ICD-10-CM | POA: Diagnosis not present

## 2016-06-05 DIAGNOSIS — L538 Other specified erythematous conditions: Secondary | ICD-10-CM | POA: Diagnosis not present

## 2016-06-05 DIAGNOSIS — R000 Tachycardia, unspecified: Secondary | ICD-10-CM | POA: Diagnosis not present

## 2016-06-06 ENCOUNTER — Telehealth: Payer: Self-pay | Admitting: *Deleted

## 2016-06-06 NOTE — Telephone Encounter (Signed)
Patients daughter notified and states she does not feel confident in the NP that is working at [facility]. She is worried that her mother is at her end stage in life and would like for her to just be comfortable instead of being at [facility]. She is worried she is not receiving the best care and is wondering if she would benefit from hospice. Patients daughter states her mother does not seem to be alert and aware and she just want her to be comfortable if this is the end. She also states she does not know if she could get her to the office to be evaluated, she is constantly sleeping and not aware of anything.

## 2016-06-06 NOTE — Telephone Encounter (Signed)
Appointment for eval?

## 2016-06-06 NOTE — Telephone Encounter (Signed)
I am happy to see the patient in the office, though the rehab center should have a physician that can evaluate her prior to when we can get her in to the office. Thanks.

## 2016-06-06 NOTE — Telephone Encounter (Signed)
Patients daughter requested a call to discuss patients health. Patient is a resident of [facility] for rehab, and has a decrease in health. Patient has a change in energy, color and urine.  Contact  Tomi Bamberger [phone]

## 2016-06-07 ENCOUNTER — Telehealth: Payer: Self-pay

## 2016-06-07 DIAGNOSIS — R000 Tachycardia, unspecified: Secondary | ICD-10-CM | POA: Diagnosis not present

## 2016-06-07 DIAGNOSIS — N390 Urinary tract infection, site not specified: Secondary | ICD-10-CM | POA: Diagnosis not present

## 2016-06-07 DIAGNOSIS — L538 Other specified erythematous conditions: Secondary | ICD-10-CM | POA: Diagnosis not present

## 2016-06-07 NOTE — Telephone Encounter (Signed)
It sounds as though she could be a candidate for hospice though she would need an evaluation to determine this. She could discuss with the nurse practitioner at the facility or ask to discuss with the M.D. that is likely covering facility as well. They could arrange for hospice to evaluate the patient while she is there so that she does not have to travel from the facility.

## 2016-06-07 NOTE — Telephone Encounter (Signed)
Pt daughter, Rosann Auerbach, called stating pt was in the hospital early Dec and from hospital was discharged to [facility]. Rosann Auerbach stated that pt health has declined greatly in the past week. Rosann Auerbach inquired about trying to get pt to [facility] for catheter changes. Made Rosann Auerbach aware that orders can be sent to [facility] for pt safety. Rosann Auerbach voiced understanding.

## 2016-06-07 NOTE — Telephone Encounter (Signed)
Patients daughter states she spoke with the social worker and they have set up an eval with hospice

## 2016-06-07 NOTE — Telephone Encounter (Signed)
Noted  

## 2016-06-11 ENCOUNTER — Ambulatory Visit: Payer: Medicare Other

## 2016-06-12 DIAGNOSIS — I472 Ventricular tachycardia: Secondary | ICD-10-CM | POA: Diagnosis not present

## 2016-06-12 DIAGNOSIS — I10 Essential (primary) hypertension: Secondary | ICD-10-CM | POA: Diagnosis not present

## 2016-06-12 DIAGNOSIS — I442 Atrioventricular block, complete: Secondary | ICD-10-CM | POA: Diagnosis not present

## 2016-06-12 DIAGNOSIS — Z87440 Personal history of urinary (tract) infections: Secondary | ICD-10-CM | POA: Diagnosis not present

## 2016-06-12 DIAGNOSIS — I2510 Atherosclerotic heart disease of native coronary artery without angina pectoris: Secondary | ICD-10-CM | POA: Diagnosis not present

## 2016-06-12 DIAGNOSIS — I4891 Unspecified atrial fibrillation: Secondary | ICD-10-CM | POA: Diagnosis not present

## 2016-06-12 DIAGNOSIS — R627 Adult failure to thrive: Secondary | ICD-10-CM | POA: Diagnosis not present

## 2016-06-12 DIAGNOSIS — F0390 Unspecified dementia without behavioral disturbance: Secondary | ICD-10-CM | POA: Diagnosis not present

## 2016-06-12 DIAGNOSIS — M545 Low back pain: Secondary | ICD-10-CM | POA: Diagnosis not present

## 2016-06-12 DIAGNOSIS — Z950 Presence of cardiac pacemaker: Secondary | ICD-10-CM | POA: Diagnosis not present

## 2016-06-12 DIAGNOSIS — K559 Vascular disorder of intestine, unspecified: Secondary | ICD-10-CM | POA: Diagnosis not present

## 2016-06-12 DIAGNOSIS — C9310 Chronic myelomonocytic leukemia not having achieved remission: Secondary | ICD-10-CM | POA: Diagnosis not present

## 2016-06-12 DIAGNOSIS — L538 Other specified erythematous conditions: Secondary | ICD-10-CM | POA: Diagnosis not present

## 2016-06-12 DIAGNOSIS — I82501 Chronic embolism and thrombosis of unspecified deep veins of right lower extremity: Secondary | ICD-10-CM | POA: Diagnosis not present

## 2016-06-12 DIAGNOSIS — M1990 Unspecified osteoarthritis, unspecified site: Secondary | ICD-10-CM | POA: Diagnosis not present

## 2016-06-12 DIAGNOSIS — I82419 Acute embolism and thrombosis of unspecified femoral vein: Secondary | ICD-10-CM | POA: Diagnosis not present

## 2016-06-12 DIAGNOSIS — I110 Hypertensive heart disease with heart failure: Secondary | ICD-10-CM | POA: Diagnosis not present

## 2016-06-12 DIAGNOSIS — L89152 Pressure ulcer of sacral region, stage 2: Secondary | ICD-10-CM | POA: Diagnosis not present

## 2016-06-12 DIAGNOSIS — I5022 Chronic systolic (congestive) heart failure: Secondary | ICD-10-CM | POA: Diagnosis not present

## 2016-06-12 DIAGNOSIS — N390 Urinary tract infection, site not specified: Secondary | ICD-10-CM | POA: Diagnosis not present

## 2016-06-12 DIAGNOSIS — C9210 Chronic myeloid leukemia, BCR/ABL-positive, not having achieved remission: Secondary | ICD-10-CM | POA: Diagnosis not present

## 2016-06-12 DIAGNOSIS — E538 Deficiency of other specified B group vitamins: Secondary | ICD-10-CM | POA: Diagnosis not present

## 2016-06-12 DIAGNOSIS — E785 Hyperlipidemia, unspecified: Secondary | ICD-10-CM | POA: Diagnosis not present

## 2016-06-12 DIAGNOSIS — R000 Tachycardia, unspecified: Secondary | ICD-10-CM | POA: Diagnosis not present

## 2016-06-12 DIAGNOSIS — D649 Anemia, unspecified: Secondary | ICD-10-CM | POA: Diagnosis not present

## 2016-06-13 ENCOUNTER — Ambulatory Visit: Payer: Medicare Other | Admitting: Surgery

## 2016-06-13 DIAGNOSIS — I5022 Chronic systolic (congestive) heart failure: Secondary | ICD-10-CM | POA: Diagnosis not present

## 2016-06-13 DIAGNOSIS — I110 Hypertensive heart disease with heart failure: Secondary | ICD-10-CM | POA: Diagnosis not present

## 2016-06-13 DIAGNOSIS — C9310 Chronic myelomonocytic leukemia not having achieved remission: Secondary | ICD-10-CM | POA: Diagnosis not present

## 2016-06-13 DIAGNOSIS — I2510 Atherosclerotic heart disease of native coronary artery without angina pectoris: Secondary | ICD-10-CM | POA: Diagnosis not present

## 2016-06-13 DIAGNOSIS — I472 Ventricular tachycardia: Secondary | ICD-10-CM | POA: Diagnosis not present

## 2016-06-13 DIAGNOSIS — I442 Atrioventricular block, complete: Secondary | ICD-10-CM | POA: Diagnosis not present

## 2016-06-14 DIAGNOSIS — I442 Atrioventricular block, complete: Secondary | ICD-10-CM | POA: Diagnosis not present

## 2016-06-14 DIAGNOSIS — I5022 Chronic systolic (congestive) heart failure: Secondary | ICD-10-CM | POA: Diagnosis not present

## 2016-06-14 DIAGNOSIS — I2510 Atherosclerotic heart disease of native coronary artery without angina pectoris: Secondary | ICD-10-CM | POA: Diagnosis not present

## 2016-06-14 DIAGNOSIS — I472 Ventricular tachycardia: Secondary | ICD-10-CM | POA: Diagnosis not present

## 2016-06-14 DIAGNOSIS — C9310 Chronic myelomonocytic leukemia not having achieved remission: Secondary | ICD-10-CM | POA: Diagnosis not present

## 2016-06-14 DIAGNOSIS — I110 Hypertensive heart disease with heart failure: Secondary | ICD-10-CM | POA: Diagnosis not present

## 2016-06-17 DIAGNOSIS — I472 Ventricular tachycardia: Secondary | ICD-10-CM | POA: Diagnosis not present

## 2016-06-17 DIAGNOSIS — C9310 Chronic myelomonocytic leukemia not having achieved remission: Secondary | ICD-10-CM | POA: Diagnosis not present

## 2016-06-17 DIAGNOSIS — I110 Hypertensive heart disease with heart failure: Secondary | ICD-10-CM | POA: Diagnosis not present

## 2016-06-17 DIAGNOSIS — I5022 Chronic systolic (congestive) heart failure: Secondary | ICD-10-CM | POA: Diagnosis not present

## 2016-06-17 DIAGNOSIS — I2510 Atherosclerotic heart disease of native coronary artery without angina pectoris: Secondary | ICD-10-CM | POA: Diagnosis not present

## 2016-06-17 DIAGNOSIS — I442 Atrioventricular block, complete: Secondary | ICD-10-CM | POA: Diagnosis not present

## 2016-06-18 ENCOUNTER — Ambulatory Visit: Payer: Medicare Other | Admitting: Podiatry

## 2016-06-18 DIAGNOSIS — I2510 Atherosclerotic heart disease of native coronary artery without angina pectoris: Secondary | ICD-10-CM | POA: Diagnosis not present

## 2016-06-18 DIAGNOSIS — I472 Ventricular tachycardia: Secondary | ICD-10-CM | POA: Diagnosis not present

## 2016-06-18 DIAGNOSIS — I110 Hypertensive heart disease with heart failure: Secondary | ICD-10-CM | POA: Diagnosis not present

## 2016-06-18 DIAGNOSIS — C9310 Chronic myelomonocytic leukemia not having achieved remission: Secondary | ICD-10-CM | POA: Diagnosis not present

## 2016-06-18 DIAGNOSIS — I442 Atrioventricular block, complete: Secondary | ICD-10-CM | POA: Diagnosis not present

## 2016-06-18 DIAGNOSIS — I5022 Chronic systolic (congestive) heart failure: Secondary | ICD-10-CM | POA: Diagnosis not present

## 2016-06-19 DIAGNOSIS — I442 Atrioventricular block, complete: Secondary | ICD-10-CM | POA: Diagnosis not present

## 2016-06-19 DIAGNOSIS — I472 Ventricular tachycardia: Secondary | ICD-10-CM | POA: Diagnosis not present

## 2016-06-19 DIAGNOSIS — I110 Hypertensive heart disease with heart failure: Secondary | ICD-10-CM | POA: Diagnosis not present

## 2016-06-19 DIAGNOSIS — C9310 Chronic myelomonocytic leukemia not having achieved remission: Secondary | ICD-10-CM | POA: Diagnosis not present

## 2016-06-19 DIAGNOSIS — I2510 Atherosclerotic heart disease of native coronary artery without angina pectoris: Secondary | ICD-10-CM | POA: Diagnosis not present

## 2016-06-19 DIAGNOSIS — I5022 Chronic systolic (congestive) heart failure: Secondary | ICD-10-CM | POA: Diagnosis not present

## 2016-06-21 DIAGNOSIS — I442 Atrioventricular block, complete: Secondary | ICD-10-CM | POA: Diagnosis not present

## 2016-06-21 DIAGNOSIS — I472 Ventricular tachycardia: Secondary | ICD-10-CM | POA: Diagnosis not present

## 2016-06-21 DIAGNOSIS — I5022 Chronic systolic (congestive) heart failure: Secondary | ICD-10-CM | POA: Diagnosis not present

## 2016-06-21 DIAGNOSIS — I110 Hypertensive heart disease with heart failure: Secondary | ICD-10-CM | POA: Diagnosis not present

## 2016-06-21 DIAGNOSIS — I2510 Atherosclerotic heart disease of native coronary artery without angina pectoris: Secondary | ICD-10-CM | POA: Diagnosis not present

## 2016-06-21 DIAGNOSIS — C9310 Chronic myelomonocytic leukemia not having achieved remission: Secondary | ICD-10-CM | POA: Diagnosis not present

## 2016-06-24 DIAGNOSIS — I5022 Chronic systolic (congestive) heart failure: Secondary | ICD-10-CM | POA: Diagnosis not present

## 2016-06-24 DIAGNOSIS — I2510 Atherosclerotic heart disease of native coronary artery without angina pectoris: Secondary | ICD-10-CM | POA: Diagnosis not present

## 2016-06-24 DIAGNOSIS — I472 Ventricular tachycardia: Secondary | ICD-10-CM | POA: Diagnosis not present

## 2016-06-24 DIAGNOSIS — C9310 Chronic myelomonocytic leukemia not having achieved remission: Secondary | ICD-10-CM | POA: Diagnosis not present

## 2016-06-24 DIAGNOSIS — I110 Hypertensive heart disease with heart failure: Secondary | ICD-10-CM | POA: Diagnosis not present

## 2016-06-24 DIAGNOSIS — I442 Atrioventricular block, complete: Secondary | ICD-10-CM | POA: Diagnosis not present

## 2016-06-25 DIAGNOSIS — I472 Ventricular tachycardia: Secondary | ICD-10-CM | POA: Diagnosis not present

## 2016-06-25 DIAGNOSIS — I5022 Chronic systolic (congestive) heart failure: Secondary | ICD-10-CM | POA: Diagnosis not present

## 2016-06-25 DIAGNOSIS — I2510 Atherosclerotic heart disease of native coronary artery without angina pectoris: Secondary | ICD-10-CM | POA: Diagnosis not present

## 2016-06-25 DIAGNOSIS — I110 Hypertensive heart disease with heart failure: Secondary | ICD-10-CM | POA: Diagnosis not present

## 2016-06-25 DIAGNOSIS — I442 Atrioventricular block, complete: Secondary | ICD-10-CM | POA: Diagnosis not present

## 2016-06-25 DIAGNOSIS — C9310 Chronic myelomonocytic leukemia not having achieved remission: Secondary | ICD-10-CM | POA: Diagnosis not present

## 2016-06-26 DIAGNOSIS — I110 Hypertensive heart disease with heart failure: Secondary | ICD-10-CM | POA: Diagnosis not present

## 2016-06-26 DIAGNOSIS — C9310 Chronic myelomonocytic leukemia not having achieved remission: Secondary | ICD-10-CM | POA: Diagnosis not present

## 2016-06-26 DIAGNOSIS — I2510 Atherosclerotic heart disease of native coronary artery without angina pectoris: Secondary | ICD-10-CM | POA: Diagnosis not present

## 2016-06-26 DIAGNOSIS — I442 Atrioventricular block, complete: Secondary | ICD-10-CM | POA: Diagnosis not present

## 2016-06-26 DIAGNOSIS — I472 Ventricular tachycardia: Secondary | ICD-10-CM | POA: Diagnosis not present

## 2016-06-26 DIAGNOSIS — I5022 Chronic systolic (congestive) heart failure: Secondary | ICD-10-CM | POA: Diagnosis not present

## 2016-06-27 DIAGNOSIS — I2510 Atherosclerotic heart disease of native coronary artery without angina pectoris: Secondary | ICD-10-CM | POA: Diagnosis not present

## 2016-06-27 DIAGNOSIS — I5022 Chronic systolic (congestive) heart failure: Secondary | ICD-10-CM | POA: Diagnosis not present

## 2016-06-27 DIAGNOSIS — I472 Ventricular tachycardia: Secondary | ICD-10-CM | POA: Diagnosis not present

## 2016-06-27 DIAGNOSIS — I110 Hypertensive heart disease with heart failure: Secondary | ICD-10-CM | POA: Diagnosis not present

## 2016-06-27 DIAGNOSIS — I442 Atrioventricular block, complete: Secondary | ICD-10-CM | POA: Diagnosis not present

## 2016-06-27 DIAGNOSIS — C9310 Chronic myelomonocytic leukemia not having achieved remission: Secondary | ICD-10-CM | POA: Diagnosis not present

## 2016-06-28 DIAGNOSIS — I442 Atrioventricular block, complete: Secondary | ICD-10-CM | POA: Diagnosis not present

## 2016-06-28 DIAGNOSIS — I472 Ventricular tachycardia: Secondary | ICD-10-CM | POA: Diagnosis not present

## 2016-06-28 DIAGNOSIS — I5022 Chronic systolic (congestive) heart failure: Secondary | ICD-10-CM | POA: Diagnosis not present

## 2016-06-28 DIAGNOSIS — I110 Hypertensive heart disease with heart failure: Secondary | ICD-10-CM | POA: Diagnosis not present

## 2016-06-28 DIAGNOSIS — I2510 Atherosclerotic heart disease of native coronary artery without angina pectoris: Secondary | ICD-10-CM | POA: Diagnosis not present

## 2016-06-28 DIAGNOSIS — C9310 Chronic myelomonocytic leukemia not having achieved remission: Secondary | ICD-10-CM | POA: Diagnosis not present

## 2016-06-30 DIAGNOSIS — I442 Atrioventricular block, complete: Secondary | ICD-10-CM | POA: Diagnosis not present

## 2016-06-30 DIAGNOSIS — C9310 Chronic myelomonocytic leukemia not having achieved remission: Secondary | ICD-10-CM | POA: Diagnosis not present

## 2016-06-30 DIAGNOSIS — I5022 Chronic systolic (congestive) heart failure: Secondary | ICD-10-CM | POA: Diagnosis not present

## 2016-06-30 DIAGNOSIS — I110 Hypertensive heart disease with heart failure: Secondary | ICD-10-CM | POA: Diagnosis not present

## 2016-06-30 DIAGNOSIS — I2510 Atherosclerotic heart disease of native coronary artery without angina pectoris: Secondary | ICD-10-CM | POA: Diagnosis not present

## 2016-06-30 DIAGNOSIS — I472 Ventricular tachycardia: Secondary | ICD-10-CM | POA: Diagnosis not present

## 2016-07-01 ENCOUNTER — Ambulatory Visit: Payer: Medicare Other

## 2016-07-01 ENCOUNTER — Other Ambulatory Visit: Payer: Medicare Other

## 2016-07-01 ENCOUNTER — Ambulatory Visit: Payer: Medicare Other | Admitting: Hematology and Oncology

## 2016-07-01 DIAGNOSIS — I472 Ventricular tachycardia: Secondary | ICD-10-CM | POA: Diagnosis not present

## 2016-07-01 DIAGNOSIS — C9310 Chronic myelomonocytic leukemia not having achieved remission: Secondary | ICD-10-CM | POA: Diagnosis not present

## 2016-07-01 DIAGNOSIS — I110 Hypertensive heart disease with heart failure: Secondary | ICD-10-CM | POA: Diagnosis not present

## 2016-07-01 DIAGNOSIS — I2510 Atherosclerotic heart disease of native coronary artery without angina pectoris: Secondary | ICD-10-CM | POA: Diagnosis not present

## 2016-07-01 DIAGNOSIS — I5022 Chronic systolic (congestive) heart failure: Secondary | ICD-10-CM | POA: Diagnosis not present

## 2016-07-01 DIAGNOSIS — I442 Atrioventricular block, complete: Secondary | ICD-10-CM | POA: Diagnosis not present

## 2016-07-02 DIAGNOSIS — I2510 Atherosclerotic heart disease of native coronary artery without angina pectoris: Secondary | ICD-10-CM | POA: Diagnosis not present

## 2016-07-02 DIAGNOSIS — I5022 Chronic systolic (congestive) heart failure: Secondary | ICD-10-CM | POA: Diagnosis not present

## 2016-07-02 DIAGNOSIS — C9310 Chronic myelomonocytic leukemia not having achieved remission: Secondary | ICD-10-CM | POA: Diagnosis not present

## 2016-07-02 DIAGNOSIS — I442 Atrioventricular block, complete: Secondary | ICD-10-CM | POA: Diagnosis not present

## 2016-07-02 DIAGNOSIS — I110 Hypertensive heart disease with heart failure: Secondary | ICD-10-CM | POA: Diagnosis not present

## 2016-07-02 DIAGNOSIS — I472 Ventricular tachycardia: Secondary | ICD-10-CM | POA: Diagnosis not present

## 2016-07-03 DIAGNOSIS — I5022 Chronic systolic (congestive) heart failure: Secondary | ICD-10-CM | POA: Diagnosis not present

## 2016-07-03 DIAGNOSIS — C9310 Chronic myelomonocytic leukemia not having achieved remission: Secondary | ICD-10-CM | POA: Diagnosis not present

## 2016-07-03 DIAGNOSIS — I442 Atrioventricular block, complete: Secondary | ICD-10-CM | POA: Diagnosis not present

## 2016-07-03 DIAGNOSIS — I472 Ventricular tachycardia: Secondary | ICD-10-CM | POA: Diagnosis not present

## 2016-07-03 DIAGNOSIS — I110 Hypertensive heart disease with heart failure: Secondary | ICD-10-CM | POA: Diagnosis not present

## 2016-07-03 DIAGNOSIS — I2510 Atherosclerotic heart disease of native coronary artery without angina pectoris: Secondary | ICD-10-CM | POA: Diagnosis not present

## 2016-07-04 DEATH — deceased

## 2016-07-10 ENCOUNTER — Ambulatory Visit: Payer: Medicare Other | Admitting: Urology

## 2016-07-12 IMAGING — US US RENAL
1 series · 14 of 25 positions shown · non-contrast
Comparison: Abdomen and pelvis CT dated 02/01/2015 and 01/13/2013.

CLINICAL DATA: Followup left hydronephrosis.

EXAM:
RENAL / URINARY TRACT ULTRASOUND COMPLETE

[Series 1: us renal · 0.21mm/px · 14 of 32 slices shown]
[im 1/32]
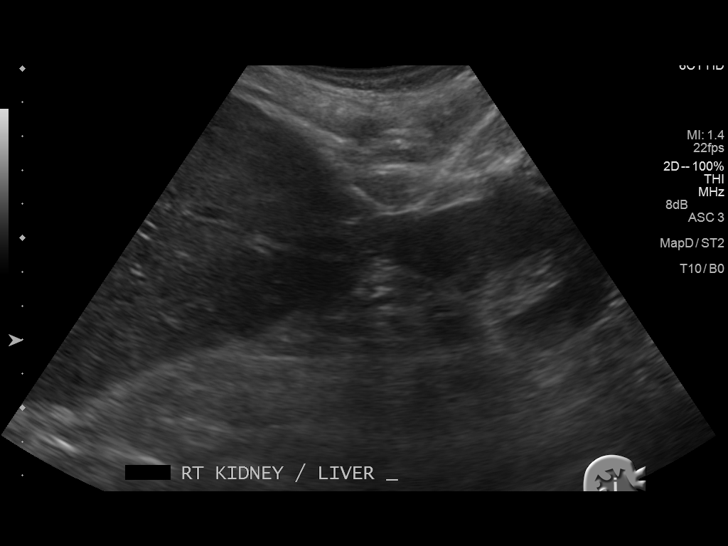
[im 3/32]
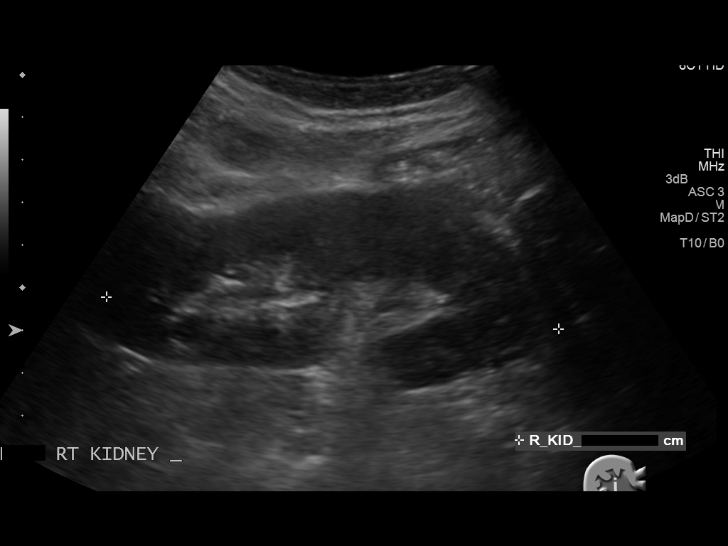
[im 6/32]
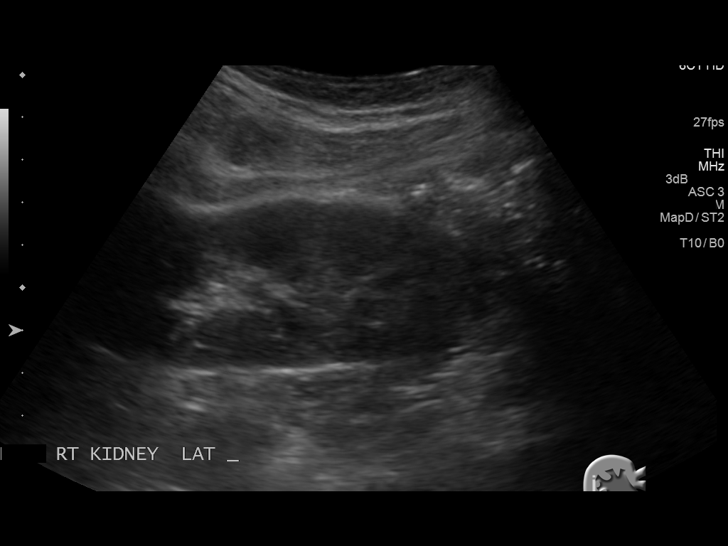
[im 8/32]
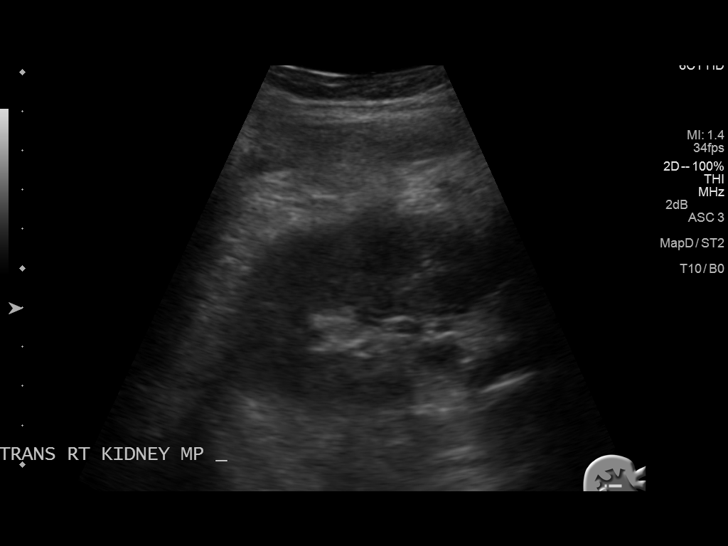
[im 11/32]
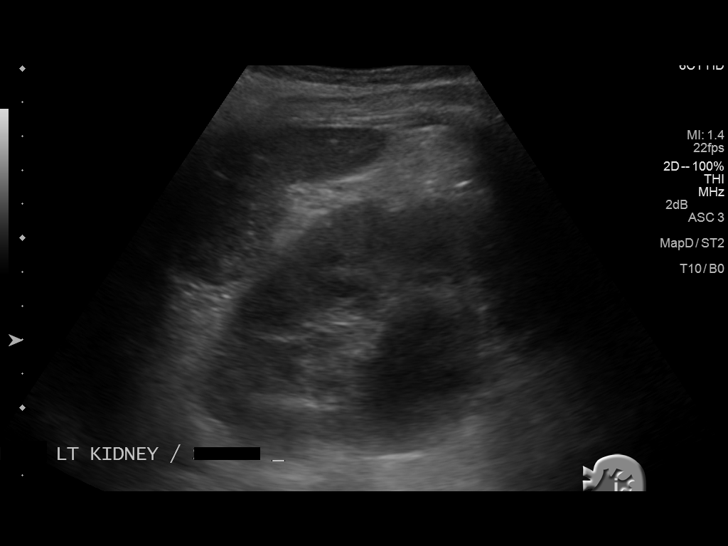
[im 12/32]
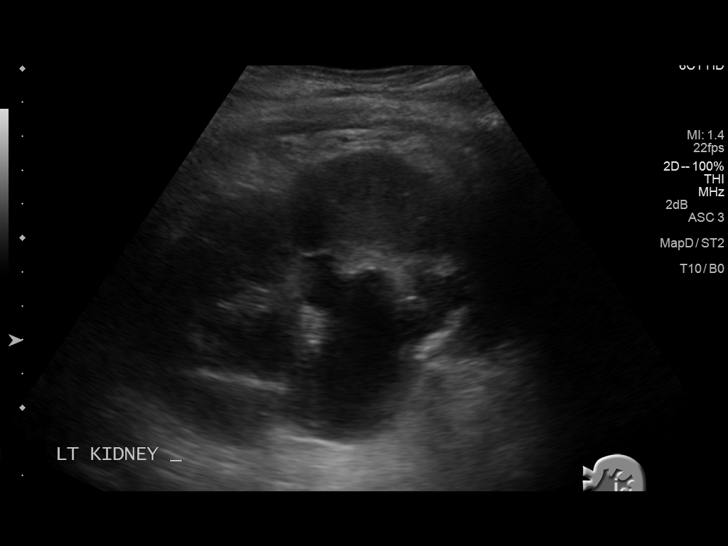
[im 15/32]
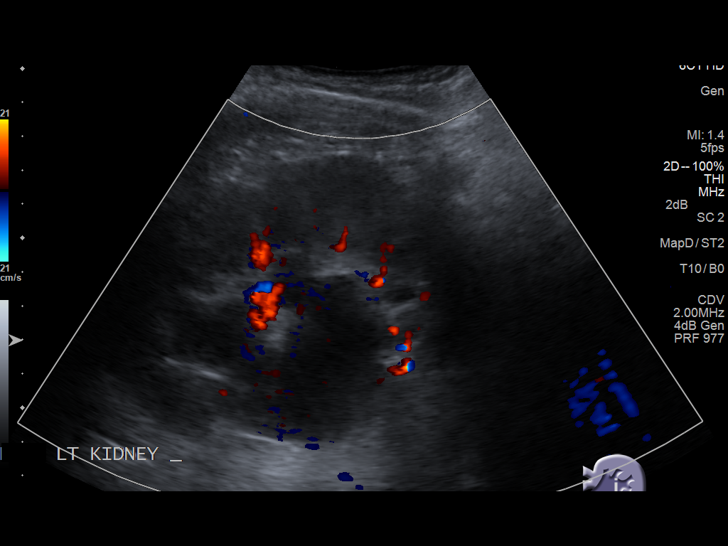
[im 17/32]
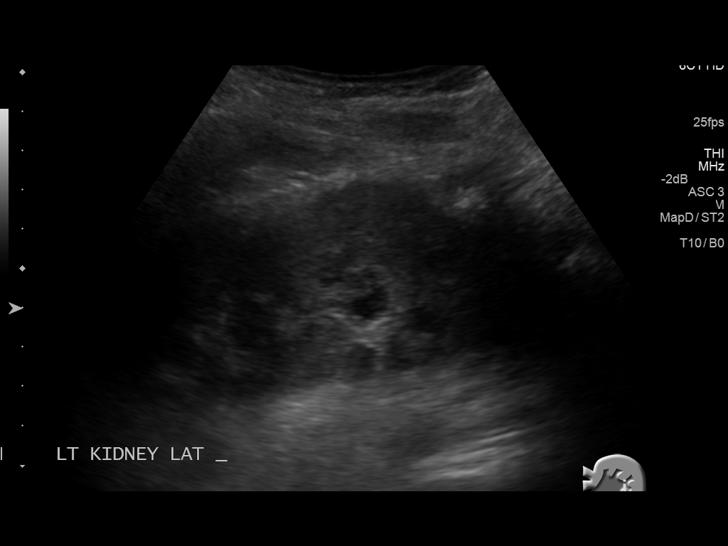
[im 20/32]
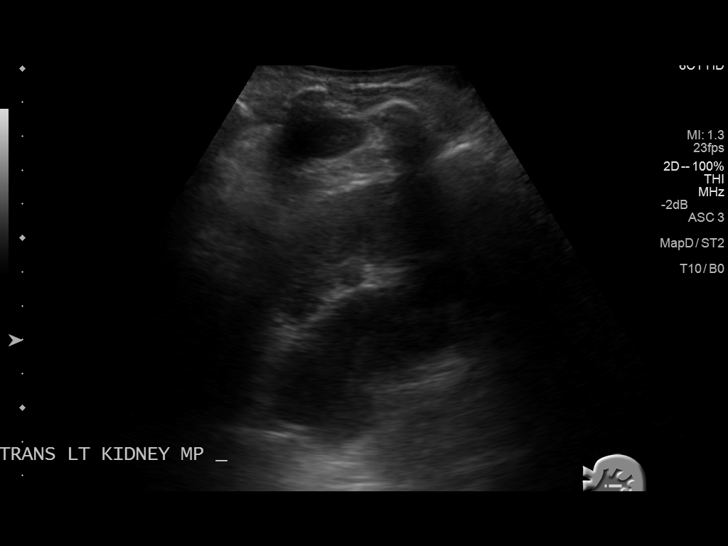
[im 21/32]
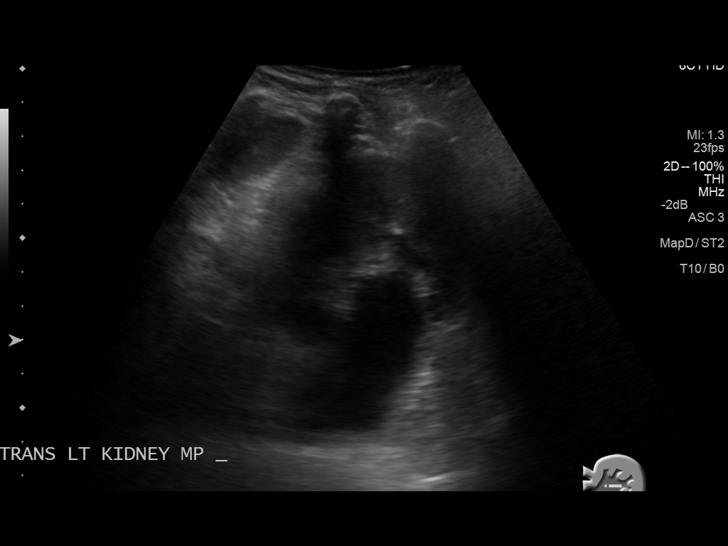
[im 24/32]
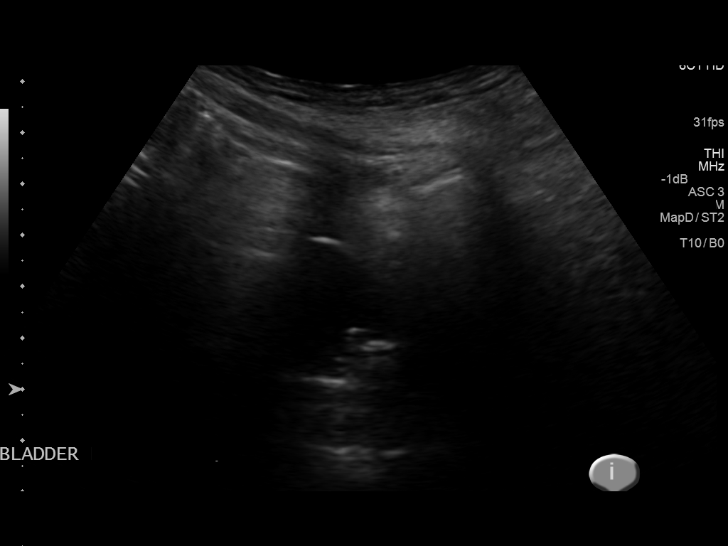
[im 26/32]
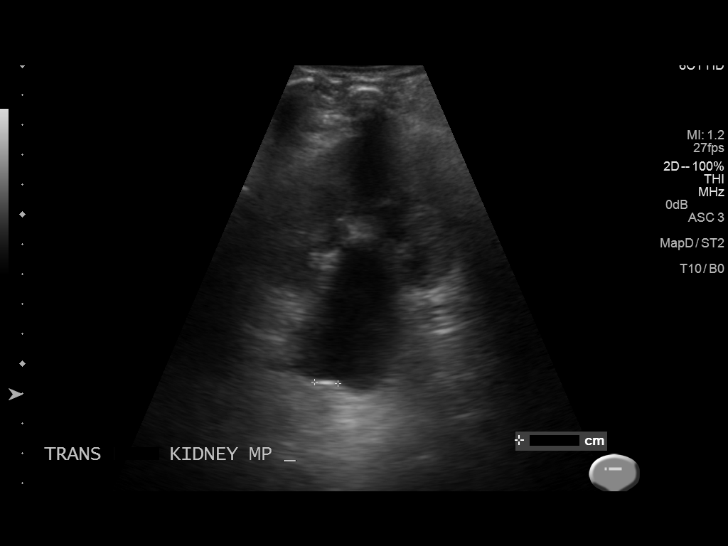
[im 29/32]
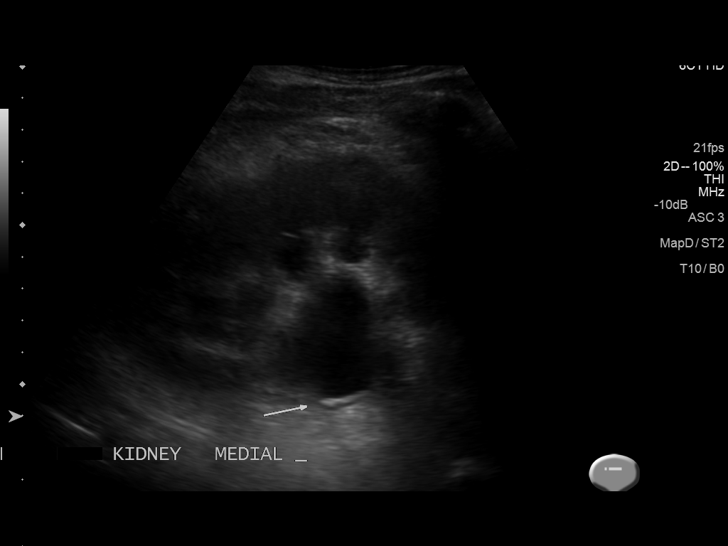
[im 32/32]
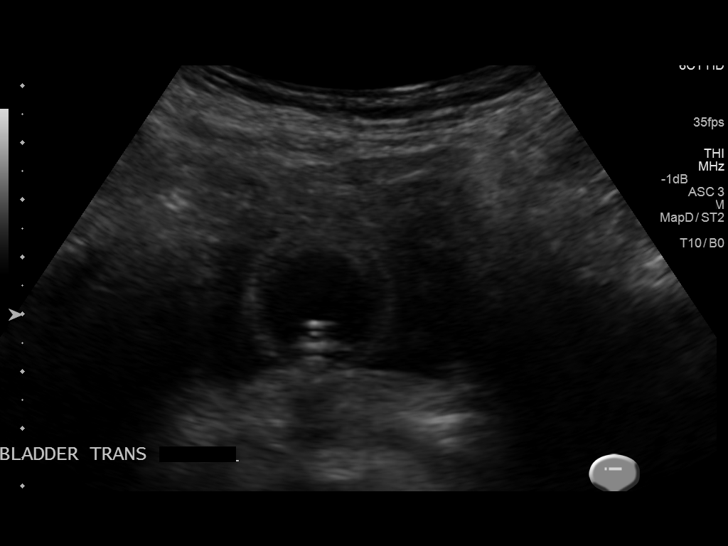

[14 of 25 positions shown; findings below may reference images not displayed]

FINDINGS: Right Kidney:

Length: 10.6 cm. Echogenicity within normal limits. No mass or
hydronephrosis visualized.

Left Kidney:

Length: 12.5 cm. Normal echogenicity. Moderate dilatation of the
left renal collecting system with no significant change.

Bladder:

Foley catheter in the bladder with minimal urine present.
IMPRESSION: Stable moderate left hydronephrosis.

## 2016-07-30 ENCOUNTER — Ambulatory Visit: Payer: Medicare Other | Admitting: Family Medicine

## 2016-08-03 ENCOUNTER — Encounter: Payer: Self-pay | Admitting: Hematology and Oncology

## 2016-09-12 ENCOUNTER — Other Ambulatory Visit: Payer: Self-pay | Admitting: Nurse Practitioner

## 2017-02-25 ENCOUNTER — Ambulatory Visit: Payer: Medicare Other

## 2017-04-23 IMAGING — CR DG CHEST 2V
2 series · 2 of 2 positions shown · non-contrast
Comparison: 01/31/2015

CLINICAL DATA: Cough, SOB non smoker

EXAM:
CHEST  2 VIEW

[chest lat]
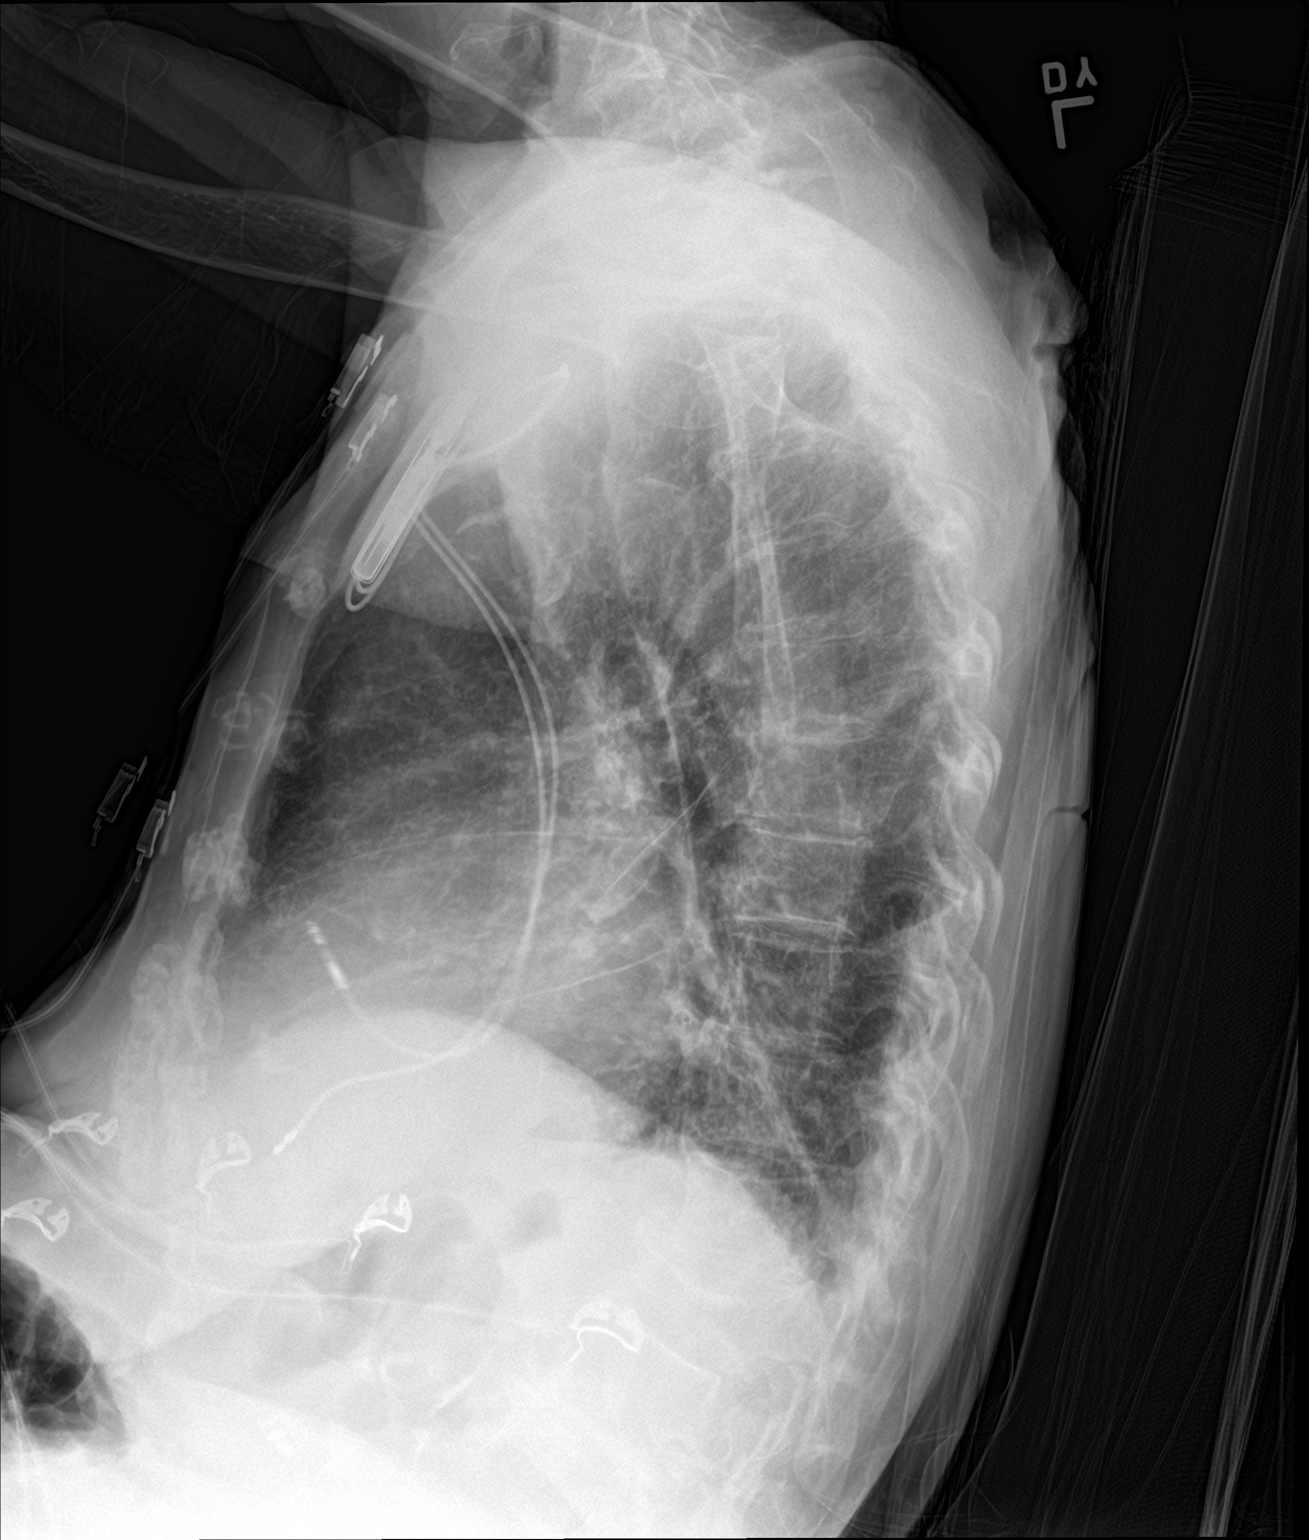

[chest ap]
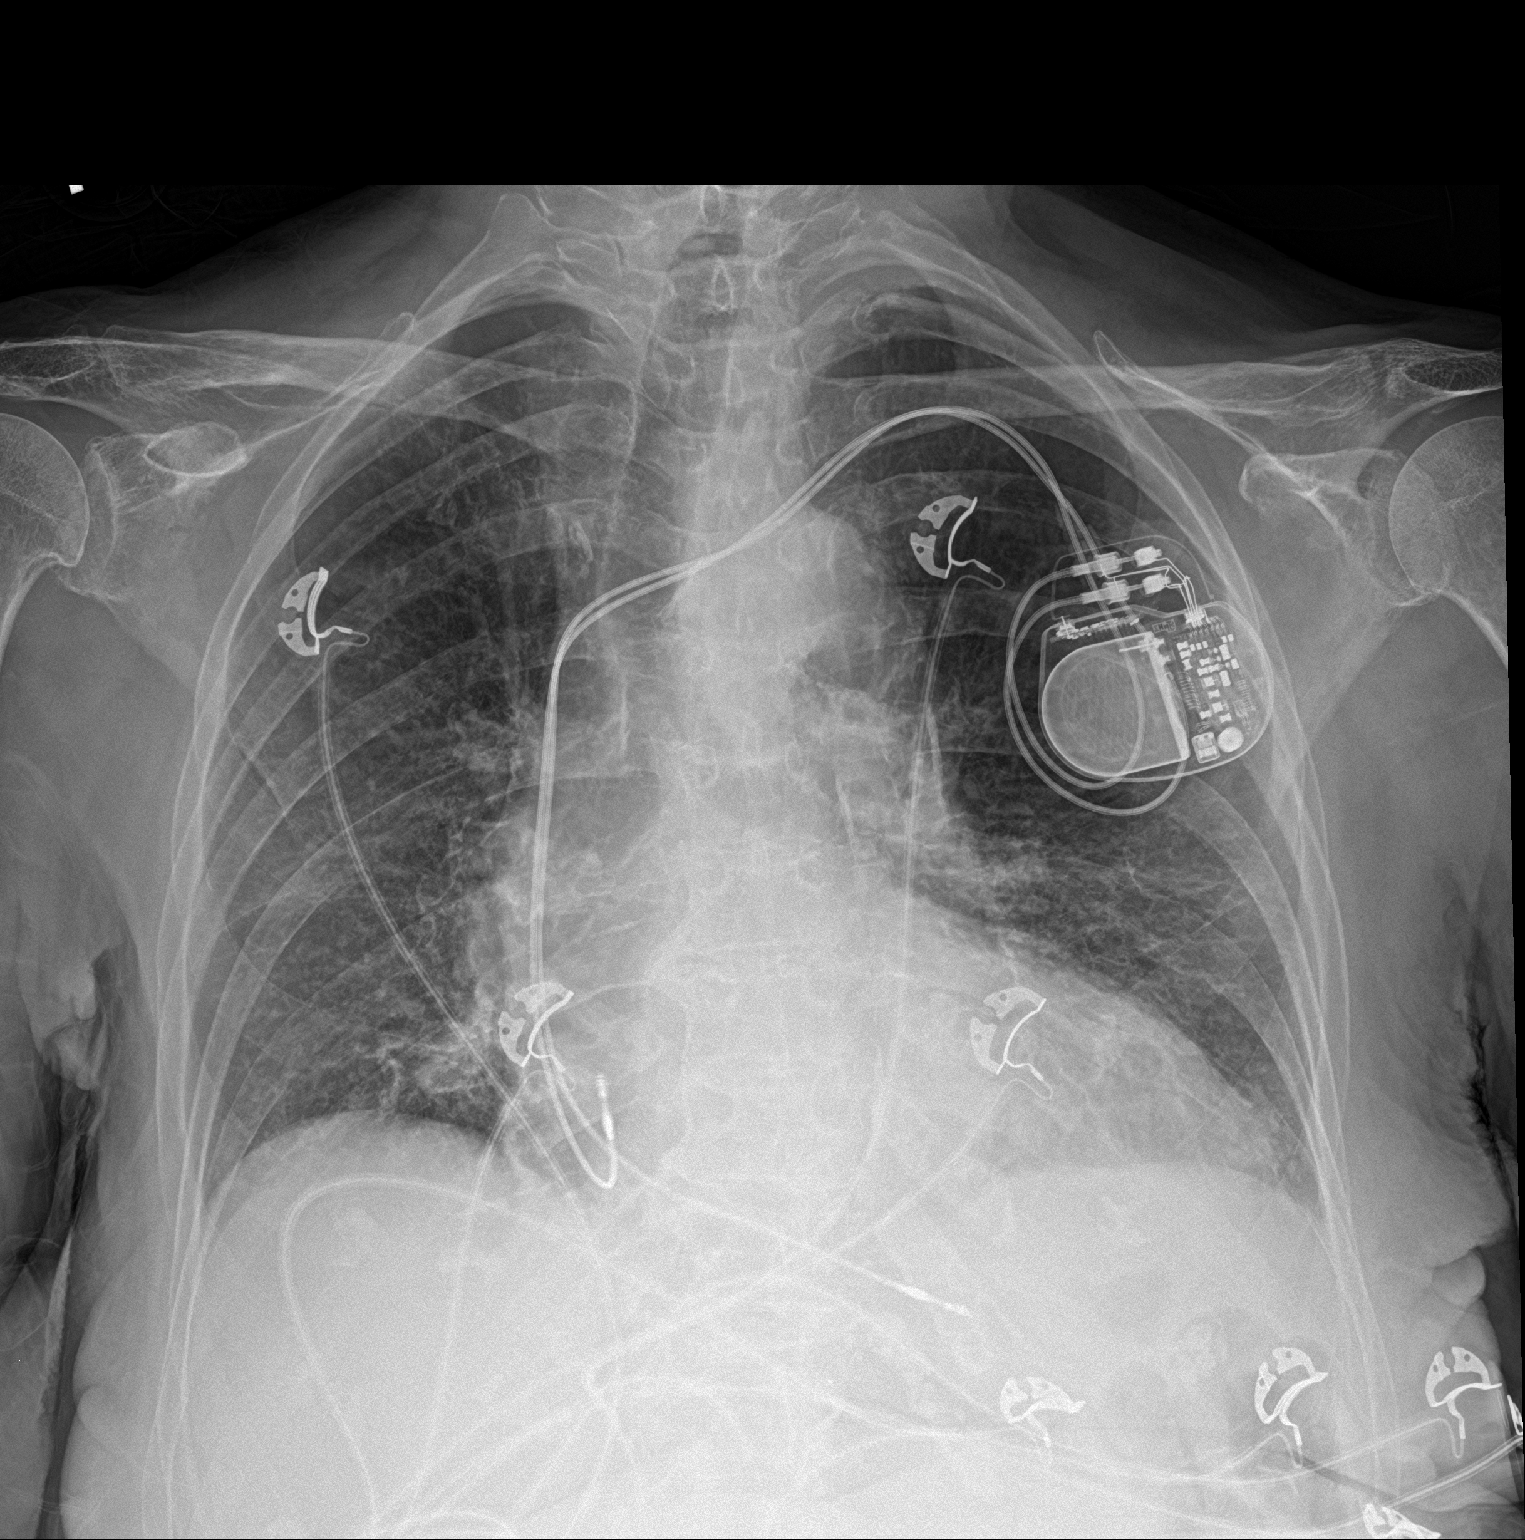

[2 of 2 positions shown; findings below may reference images not displayed]

FINDINGS: Left-sided transvenous pacemaker leads to the right atrium and right
ventricle. The heart is enlarged. There are no focal consolidations
or pleural effusions. Mildly prominent interstitial marking are
consistent with mild interstitial pulmonary edema.
IMPRESSION: Cardiomegaly and mild interstitial edema.
